# Patient Record
Sex: Male | Born: 1955
Health system: Southern US, Community
[De-identification: ages and names within clinical notes are randomized; demographics above are authoritative.]

## PROBLEM LIST (undated history)

## (undated) DIAGNOSIS — I10 Essential (primary) hypertension: Secondary | ICD-10-CM

## (undated) DIAGNOSIS — M65842 Other synovitis and tenosynovitis, left hand: Secondary | ICD-10-CM

## (undated) DIAGNOSIS — G4733 Obstructive sleep apnea (adult) (pediatric): Secondary | ICD-10-CM

## (undated) DIAGNOSIS — M1990 Unspecified osteoarthritis, unspecified site: Secondary | ICD-10-CM

## (undated) DIAGNOSIS — M109 Gout, unspecified: Secondary | ICD-10-CM

## (undated) DIAGNOSIS — E785 Hyperlipidemia, unspecified: Secondary | ICD-10-CM

## (undated) HISTORY — DX: Essential (primary) hypertension: I10

## (undated) HISTORY — DX: Hyperlipidemia, unspecified: E78.5

## (undated) HISTORY — PX: UMBILICAL HERNIA REPAIR: SHX196

## (undated) HISTORY — DX: Obstructive sleep apnea (adult) (pediatric): G47.33

## (undated) HISTORY — PX: HERNIA REPAIR: SHX51

---

## 1999-09-09 ENCOUNTER — Ambulatory Visit: Admission: RE | Admit: 1999-09-09 | Discharge: 1999-09-09 | Payer: Self-pay | Admitting: Pulmonary Disease

## 2002-06-28 ENCOUNTER — Ambulatory Visit (HOSPITAL_COMMUNITY): Admission: RE | Admit: 2002-06-28 | Discharge: 2002-06-28 | Payer: Self-pay | Admitting: General Surgery

## 2005-02-06 ENCOUNTER — Other Ambulatory Visit: Admission: RE | Admit: 2005-02-06 | Discharge: 2005-02-06 | Payer: Self-pay | Admitting: Dermatology

## 2005-05-29 ENCOUNTER — Ambulatory Visit: Payer: Self-pay | Admitting: Internal Medicine

## 2005-05-29 ENCOUNTER — Ambulatory Visit (HOSPITAL_COMMUNITY): Admission: RE | Admit: 2005-05-29 | Discharge: 2005-05-29 | Payer: Self-pay | Admitting: Internal Medicine

## 2005-11-18 ENCOUNTER — Ambulatory Visit: Payer: Self-pay | Admitting: Orthopedic Surgery

## 2009-06-20 ENCOUNTER — Telehealth (INDEPENDENT_AMBULATORY_CARE_PROVIDER_SITE_OTHER): Payer: Self-pay | Admitting: *Deleted

## 2010-10-22 ENCOUNTER — Ambulatory Visit (INDEPENDENT_AMBULATORY_CARE_PROVIDER_SITE_OTHER): Payer: BC Managed Care – PPO | Admitting: Pulmonary Disease

## 2010-10-22 ENCOUNTER — Encounter: Payer: Self-pay | Admitting: Pulmonary Disease

## 2010-10-22 VITALS — BP 122/84 | HR 75 | Temp 97.6°F | Ht 70.0 in | Wt 262.0 lb

## 2010-10-22 DIAGNOSIS — G4733 Obstructive sleep apnea (adult) (pediatric): Secondary | ICD-10-CM

## 2010-10-22 NOTE — Progress Notes (Signed)
  Subjective:    Patient ID: Richard Nolan, male    DOB: [birth year], 55 y.o.   MRN: [id number]  HPI The pt is a 54y/o male who comes in today to re-establish for management of osa.  He was first diagnosed in 1999 with AHI 37/hr, and ultimately found to have optimal cpap of 15cm.  He has been compliant with cpap since starting, but is using the same machine from 13 yrs ago.  He is way overdue for a new mask and supplies.  He is compliant with the device, and feels he is sleeping well.  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  He denies any issues with sleepiness during his time to be awake.  His weight is up about 12 pounds from the last visit, and his epworth score is only 9 today.    Sleep Questionnaire: What time do you typically go to bed?( Between what hours) 8 pm to 9 pm How long does it take you to fall asleep? few minutes How many times during the night do you wake up? 0 What time do you get out of bed to start your day? 0145 Do you drive or operate heavy machinery in your occupation? Yes How much has your weight changed (up or down) over the past two years? (In pounds) 0 oz (0 kg) Have you ever had a sleep study before? Yes If yes, location of study? [place] x 2 If yes, date of study? approx 10 to 12 years ago Do you currently use CPAP? Yes If so, what pressure? 15 Do you wear oxygen at any time? No    Review of Systems  Constitutional: Negative for fever and unexpected weight change.  HENT: Negative for ear pain, nosebleeds, congestion, sore throat, rhinorrhea, sneezing, trouble swallowing, dental problem, postnasal drip and sinus pressure.   Eyes: Negative for redness and itching.  Respiratory: Negative for cough, chest tightness, shortness of breath and wheezing.   Cardiovascular: Negative for palpitations and leg swelling.  Gastrointestinal: Negative for nausea and vomiting.  Genitourinary: Negative for dysuria.  Musculoskeletal: Negative for joint  swelling.  Skin: Negative for rash.  Neurological: Negative for headaches.  Hematological: Does not bruise/bleed easily.  Psychiatric/Behavioral: Negative for dysphoric mood. The patient is not nervous/anxious.        Objective:   Physical Exam Constitutional:  Well developed, but overweight, no acute distress  HENT:  Nares with deviated septum to right with near obstruction, left patent  Oropharynx without exudate, palate and uvula elongated  Eyes:  Perrla, eomi, no scleral icterus  Neck:  No JVD, no TMG  Cardiovascular:  Normal rate, regular rhythm, no rubs or gallops.  No murmurs        Intact distal pulses  Pulmonary :  Normal breath sounds, no stridor or respiratory distress   No rales, rhonchi, or wheezing  Abdominal:  Soft, nondistended, bowel sounds present.  No tenderness noted.   Musculoskeletal:  No lower extremity edema noted.  Lymph Nodes:  No cervical lymphadenopathy noted  Skin:  No cyanosis noted  Neurologic:  Alert, appropriate, moves all 4 extremities without obvious deficit.         Assessment & Plan:

## 2010-10-22 NOTE — Assessment & Plan Note (Signed)
The pt has a h/o moderate to severe osa from 1999, and has been very compliant with cpap since that time with excellent response.  He is way overdue for new supplies and a new cpap machine, and will arrange this thru DME.  He feels he is sleeping well, and denies EDS.  I have encouraged him to work on weight loss, and to also see me yearly.

## 2010-10-22 NOTE — Patient Instructions (Signed)
Will get you new cpap mask and supplies Consider getting new cpap machine.  Yours is very old. Work on weight loss followup with me in one year

## 2010-10-23 ENCOUNTER — Encounter: Payer: Self-pay | Admitting: Pulmonary Disease

## 2011-03-18 ENCOUNTER — Telehealth: Payer: Self-pay | Admitting: Pulmonary Disease

## 2011-03-18 DIAGNOSIS — G4733 Obstructive sleep apnea (adult) (pediatric): Secondary | ICD-10-CM

## 2011-03-18 NOTE — Telephone Encounter (Signed)
Pt returning call can be reached at [phone].Richard Nolan

## 2011-03-18 NOTE — Telephone Encounter (Signed)
Pt says he did a 2 week download back in May 2012 and has never heard from the results. He is still in need of a new mask and woul dlike an order for this to be sent to [facility] in [place]. Pls advise.

## 2011-03-18 NOTE — Telephone Encounter (Signed)
lmomtcb  

## 2011-03-18 NOTE — Telephone Encounter (Signed)
Let pt know that we have not received his download, but will be happy to try and get this for him. Ok to send prescription for him to get new mask .

## 2011-03-18 NOTE — Telephone Encounter (Signed)
Pt is aware we will send an order for a new CPAP mask to [facility] in [place] and we will send msg to Dr. Teddy Spike nurse so she can follow-up on download from May 2012.

## 2011-03-20 NOTE — Telephone Encounter (Signed)
Called and spoke with Amy at [facility] and requested download on cpap machine from May 2012.  Waiting on fax.

## 2011-03-20 NOTE — Telephone Encounter (Signed)
Received results from [facility] and put in [facility] VIP folder.

## 2011-03-20 NOTE — Telephone Encounter (Signed)
Let pt know that it appears his optimal pressure on download is 10cm.  Can send order to dme for this.

## 2011-03-20 NOTE — Telephone Encounter (Signed)
LMTCB

## 2011-03-21 NOTE — Telephone Encounter (Signed)
lmomtcb  

## 2011-03-21 NOTE — Telephone Encounter (Signed)
Pt aware of results and referral has been sent

## 2011-07-15 ENCOUNTER — Telehealth: Payer: Self-pay | Admitting: Pulmonary Disease

## 2011-07-15 DIAGNOSIS — G4733 Obstructive sleep apnea (adult) (pediatric): Secondary | ICD-10-CM

## 2011-07-15 NOTE — Telephone Encounter (Signed)
I would be happy to do this, but with DOT involved, will need download off his machine for the last 6mos to document his compliance before I can write his note. Please send an order to dme to get this asap, and let pt know so that he can take machine to dme to have this done asap  (needs 6mos download).  Will write his note once I get the download.

## 2011-07-15 NOTE — Telephone Encounter (Addendum)
lmomtcb x1 on # left to call ATC house # but NA [facility]

## 2011-07-15 NOTE — Telephone Encounter (Signed)
Pt returned triage's call.  Pt stated this needs to be taken care of ASAP or he will be fired.  Pt asked to be reached at [phone].  Richard Nolan

## 2011-07-15 NOTE — Telephone Encounter (Signed)
I spoke with Richard Nolan and he needs KC to write him a letter for DOT stating he is complaint w/ CPAP and it's "doing it's job". Richard Nolan states his certificate expires at the end of this month and needs this done at least by the end of the week. Please advise Dr. Shelle Iron

## 2011-07-16 ENCOUNTER — Telehealth: Payer: Self-pay | Admitting: Pulmonary Disease

## 2011-07-16 NOTE — Telephone Encounter (Signed)
LMTCB

## 2011-07-16 NOTE — Telephone Encounter (Signed)
[facility] pharmacy returned Richard Nolan's call.  Richard Nolan

## 2011-07-16 NOTE — Telephone Encounter (Signed)
So you are telling me that his current machine is totally non-downloadable??? Please verify this with dme and let me know.

## 2011-07-16 NOTE — Telephone Encounter (Signed)
Spoke with pt and notified of recs per [facility]. Pt verbalized understanding and urgent order was sent to [facility] for CPAP download to be obtained.

## 2011-07-16 NOTE — Telephone Encounter (Signed)
Pt returned triage's call.   °

## 2011-07-16 NOTE — Telephone Encounter (Signed)
LMTCB for Ms. Richard Nolan

## 2011-07-16 NOTE — Telephone Encounter (Signed)
lmtcb

## 2011-07-16 NOTE — Telephone Encounter (Signed)
Pt stated pharmacy is [name] in [place] [phone]  & that we need to ask to speak w/ [name].  Pt stated that he is not in DOT compliance w/ CPAP & needs it fixed now.  Richard Nolan

## 2011-07-16 NOTE — Telephone Encounter (Signed)
Pt's cpap is 58yrs old and can not give compliance dot needs so pt wants kc to give and order to [facility] to get a new cpap that will do this so he can be dot compliant

## 2011-07-16 NOTE — Telephone Encounter (Signed)
Called [facility] spoke with [facility], one of the resp therapists.  She stated that they received an order for pt to have a 6mon download on his CPAP.  Velna Hatchet stated that they will contact pt to bring his CPAP in to retrieve this info and send it to our office.  Will forward to [facility] as pt has returned Libby's call.

## 2011-07-17 ENCOUNTER — Other Ambulatory Visit: Payer: Self-pay | Admitting: Pulmonary Disease

## 2011-07-17 DIAGNOSIS — G4733 Obstructive sleep apnea (adult) (pediatric): Secondary | ICD-10-CM

## 2011-07-17 NOTE — Telephone Encounter (Signed)
Pt is returning call.  Pt now states that DOT will issue him a new card so long as KC stated that his condition is controlled.  MUST state something along those lines.  Pt's wife can be reached at [phone] or pt can be reached at [phone].  Antionette Fairy

## 2011-07-17 NOTE — Telephone Encounter (Signed)
Pt called back again. Says he needs this rx for  DOT compliance. Call him asap today at [phone]. Hazel Sams

## 2011-07-17 NOTE — Telephone Encounter (Signed)
Yes he needs a new machine please give an order today pt keeps calling

## 2011-07-17 NOTE — Telephone Encounter (Signed)
I spoke with pt and he states he needs a new cpap machine bc his non downloadable for his DOT compliance report. Pt also states he needs Dr. Shelle Iron to write him a letter stating his OSA is controlled by using the CPAP. Pt states if this is not done by feb 1 then he will be unemployed. Pt is needing this sent to the doctor in [place] but could not tell me his name/fax #. Pt states when we call him when the letter is done he will have this information. Please advise Dr. Shelle Iron, thanks

## 2011-07-17 NOTE — Telephone Encounter (Signed)
Let him know that I cannot write a letter saying his sleep apnea is controlled without a download showing he is totally compliant.  I can write a letter saying he is seeing me for sleep apnea, and that he denies sleepiness during the day.  I have no way of documenting whether his sleep apnea is controlled or not without a download.  The other option is to get a 6 week extension, get him a new machine, then get 4 week download to document his "control".  Then I could write his letter as such.  Will send order to dme for new machine regardless of what he decides.

## 2011-07-18 NOTE — Telephone Encounter (Signed)
Called and spoke with pt. Pt states he appreciates the order sent to DME company for new machine.  He will go by the DME office either today or tomorrow to pick up machine.  Pt is ok with KC's suggestion about a 6 week extension.  Pt requests KC write a letter stating he is following pt for his OSA and needing a 6 week extension to allow [place] to get a download off of pt's cpap machine to verify compliance with his cpap.  Pt states he can then turn the letter into his DOT doctor in [place].  KC, please advise.  Thanks!

## 2011-07-18 NOTE — Telephone Encounter (Signed)
done

## 2011-07-18 NOTE — Telephone Encounter (Signed)
LMOM informing pt I have faxed letter to DR. Guarino and that I was also going to mail pt a copy of the letter for his records.  Informed pt to call us back if there was anything further he needed.

## 2011-07-18 NOTE — Telephone Encounter (Signed)
Pt returned triage's call & said the fax number is [phone] to the Attn: of Dr. Laverle Hobby.  Richard Nolan

## 2011-07-18 NOTE — Telephone Encounter (Signed)
LMOM for pt TCB to see if he wants letter mailed, faxed or if he wants to pick it up.

## 2011-09-17 ENCOUNTER — Telehealth: Payer: Self-pay | Admitting: Pulmonary Disease

## 2011-09-17 NOTE — Telephone Encounter (Signed)
Sounds more like a download report from [facility] in [place]. Spoke with Aundra Millet regarding this and we will check on this tomorrow and advise.

## 2011-09-17 NOTE — Telephone Encounter (Signed)
Received download from [facility] pharmacy and put in [facility] VIP folder for him to review

## 2011-09-18 NOTE — Telephone Encounter (Signed)
ATC pt and was hung up on and tried to call back no answer. WCb later  I have made copy and placed in [facility] scan folder. Letter is hanging in triage

## 2011-09-18 NOTE — Telephone Encounter (Signed)
Pt returned call w/ the fax number:  [phone].  Richard Nolan

## 2011-09-18 NOTE — Telephone Encounter (Signed)
Let pt know that I have reviewed his download.  Shows good compliance and control of his sleep apnea.  The only issue here is that it is only a 4 week download.  DOT sometimes wants 6mos!! I had asked them for a 6mos download from his old machine.  Will write a letter for the 4 week download, and hopefully this will be enough for DOT>

## 2011-09-18 NOTE — Telephone Encounter (Signed)
I called pt and confirmed fax # pt is wanting the letter sent to. He states to Dr. Carnella Guadalajara at [place] [facility]. I have faxed this over and nothing further was needed

## 2011-09-18 NOTE — Telephone Encounter (Signed)
I spoke with pt and he is aware of KC response. Pt is going to call back to give [place] a fax # to fax the letter to. The letter is hanging in triage. Will await call back

## 2011-09-18 NOTE — Telephone Encounter (Signed)
Pt returned call. Richard Nolan  

## 2011-10-03 ENCOUNTER — Encounter: Payer: Self-pay | Admitting: Pulmonary Disease

## 2011-10-28 ENCOUNTER — Ambulatory Visit (INDEPENDENT_AMBULATORY_CARE_PROVIDER_SITE_OTHER): Payer: BC Managed Care – PPO | Admitting: Pulmonary Disease

## 2011-10-28 ENCOUNTER — Encounter: Payer: Self-pay | Admitting: Pulmonary Disease

## 2011-10-28 VITALS — BP 118/74 | HR 68 | Temp 98.7°F | Ht 70.0 in | Wt 254.4 lb

## 2011-10-28 DIAGNOSIS — G4733 Obstructive sleep apnea (adult) (pediatric): Secondary | ICD-10-CM

## 2011-10-28 NOTE — Assessment & Plan Note (Signed)
The patient is wearing CPAP compliance, and feels that he is doing well with the device.  I have asked him to keep up with mask changes and supplies, and to work aggressively on weight loss.  He is doing well, I will see him back in one year.

## 2011-10-28 NOTE — Patient Instructions (Signed)
Stay on cpap, keep up with supplies and mask changes. Work on weight loss followup with me in one year.

## 2011-10-28 NOTE — Progress Notes (Signed)
  Subjective:    Patient ID: Richard Nolan, male    DOB: 08/31/1955, 56 y.o.   MRN: [id number]  HPI The patient comes in today for followup of his known obstructive sleep apnea.  He is wearing CPAP compliance, and is having no issues with his mask fit or pressure.  He feels that he sleeps well, and denies any issues with daytime sleepiness.  He has been keeping up with his mask changes and supplies.   Review of Systems  Constitutional: Negative for fever and unexpected weight change.  HENT: Negative for ear pain, nosebleeds, congestion, sore throat, rhinorrhea, sneezing, trouble swallowing, dental problem, postnasal drip and sinus pressure.   Eyes: Negative for redness and itching.  Respiratory: Negative for cough, chest tightness, shortness of breath and wheezing.   Cardiovascular: Negative for palpitations and leg swelling.  Gastrointestinal: Negative for nausea and vomiting.  Genitourinary: Negative for dysuria.  Musculoskeletal: Negative for joint swelling.  Skin: Negative for rash.  Neurological: Negative for headaches.  Hematological: Does not bruise/bleed easily.  Psychiatric/Behavioral: Negative for dysphoric mood. The patient is not nervous/anxious.        Objective:   Physical Exam Overweight male in no acute distress Nose without purulence or discharge noted No skin breakdown or pressure necrosis from the CPAP mask Lower extremities without edema, no cyanosis Alert and oriented, moves all 4 extremities.       Assessment & Plan:

## 2012-06-29 ENCOUNTER — Telehealth: Payer: Self-pay | Admitting: Pulmonary Disease

## 2012-06-29 NOTE — Telephone Encounter (Signed)
Letter for CPAP compliance was faxed to the number that pt requested Spoke with pt and notified that this was done

## 2012-10-27 ENCOUNTER — Encounter: Payer: Self-pay | Admitting: Pulmonary Disease

## 2012-10-27 ENCOUNTER — Ambulatory Visit (INDEPENDENT_AMBULATORY_CARE_PROVIDER_SITE_OTHER): Payer: BC Managed Care – PPO | Admitting: Pulmonary Disease

## 2012-10-27 VITALS — BP 136/78 | HR 73 | Temp 97.1°F | Ht 70.0 in | Wt 260.2 lb

## 2012-10-27 DIAGNOSIS — G4733 Obstructive sleep apnea (adult) (pediatric): Secondary | ICD-10-CM

## 2012-10-27 NOTE — Assessment & Plan Note (Signed)
The patient is doing very well on CPAP, and his download shows excellent compliance and control of his sleep disordered breathing.  I've asked the patient to continue with his CPAP, and to keep up with his supplies.  I have also encouraged him to work aggressively on weight loss.

## 2012-10-27 NOTE — Patient Instructions (Addendum)
Your download looks great.  Keep wearing cpap Keep up with mask cushion changes and supplies Work on weight loss followup with me in one year if doing well.

## 2012-10-27 NOTE — Progress Notes (Signed)
  Subjective:    Patient ID: Richard Nolan, male    DOB: [birth year], 57 y.o.   MRN: [id number]  HPI Patient comes in today for followup of his obstructive sleep apnea.  He is wearing CPAP compliantly but has downloaded, and has excellent control of his AHI.  The patient feels that he is sleeping well, and is having no significant daytime sleepiness.  He is having no issues with his mask fit or pressure.  Of note, his weight is stable since his last visit.   Review of Systems  Constitutional: Negative for fever and unexpected weight change.  HENT: Negative for ear pain, nosebleeds, congestion, sore throat, rhinorrhea, sneezing, trouble swallowing, dental problem, postnasal drip and sinus pressure.        Nasal congestion with CPAP use  Eyes: Negative for redness and itching.  Respiratory: Negative for cough, chest tightness, shortness of breath and wheezing.   Cardiovascular: Negative for palpitations and leg swelling.  Gastrointestinal: Negative for nausea and vomiting.  Genitourinary: Negative for dysuria.  Musculoskeletal: Negative for joint swelling.  Skin: Negative for rash.  Neurological: Negative for headaches.  Hematological: Does not bruise/bleed easily.  Psychiatric/Behavioral: Negative for dysphoric mood. The patient is not nervous/anxious.        Objective:   Physical Exam Obese male in no acute distress  nose with purulent discharge noted No skin breakdown or pressure necrosis from a CPAP mask Neck without lymphadenopathy or thyromegaly Lower extremities without edema, no cyanosis Alert and oriented, moves all 4 extremities.       Assessment & Plan:

## 2012-12-10 ENCOUNTER — Ambulatory Visit (INDEPENDENT_AMBULATORY_CARE_PROVIDER_SITE_OTHER): Payer: BC Managed Care – PPO

## 2012-12-10 ENCOUNTER — Ambulatory Visit (INDEPENDENT_AMBULATORY_CARE_PROVIDER_SITE_OTHER): Payer: BC Managed Care – PPO | Admitting: Orthopedic Surgery

## 2012-12-10 VITALS — BP 122/78 | Ht 69.0 in | Wt 254.0 lb

## 2012-12-10 DIAGNOSIS — M79609 Pain in unspecified limb: Secondary | ICD-10-CM

## 2012-12-10 DIAGNOSIS — M79672 Pain in left foot: Secondary | ICD-10-CM

## 2012-12-10 DIAGNOSIS — M6789 Other specified disorders of synovium and tendon, multiple sites: Secondary | ICD-10-CM

## 2012-12-10 NOTE — Patient Instructions (Addendum)
Over the counter aspercreme twice a day   Inserts in shoe   Return 1 month

## 2012-12-14 ENCOUNTER — Encounter: Payer: Self-pay | Admitting: Orthopedic Surgery

## 2012-12-14 DIAGNOSIS — M6789 Other specified disorders of synovium and tendon, multiple sites: Secondary | ICD-10-CM | POA: Insufficient documentation

## 2012-12-14 DIAGNOSIS — M6790 Unspecified disorder of synovium and tendon, unspecified site: Secondary | ICD-10-CM | POA: Insufficient documentation

## 2012-12-14 NOTE — Progress Notes (Signed)
Patient ID: Richard Nolan., male   DOB: [birth year], 57 y.o.   MRN: [id number] Chief Complaint  Patient presents with  . Foot Pain    Left foot pain. Referred by Dr. Ouida Sills    History pain left foot on the side of the heel which started gradually and undergo with no trauma. Patient is 7/10 throbbing intermittent pain partially relieved by ice and medication worse with movement associated with swelling  Review of systems snoring cough heartburn joint pain  The past, family history and social history have been reviewed and are recorded in the corresponding sections of epic   BP 122/78  Ht 5\' 9"  (1.753 m)  Wt 254 lb (115.214 kg)  BMI 37.49 kg/m2 General appearance is normal, the patient is alert and oriented x3 with normal mood and affect.  Habitus mesomorphic. Ambulation and station are normal. There is tenderness on the dorsum and posterior part of the foot. There is questionable arthritis in the midfoot. There is questionable posterior tibial tendon dysfunction based on the swelling and tenderness over the posterior tibial tendon which is tracking up the posterior medial malleolus. He has a normal tip toe test with single leg heel rise. Ankle stability confirmed. No atrophy. Skin intact. Pulse and temperature normal. Normal sensation.  X-ray show no acute abnormality  Impression posterior tibial tendon dysfunction  Plan over-the-counter inserts at this point. The patient does not want to stay out of work for 6 weeks in a [employer]. We'll reevaluate him to see if this has made any improvement. Also use Aspercreme as a topical anti-inflammatory.

## 2013-01-07 ENCOUNTER — Ambulatory Visit (INDEPENDENT_AMBULATORY_CARE_PROVIDER_SITE_OTHER): Payer: BC Managed Care – PPO | Admitting: Orthopedic Surgery

## 2013-01-07 VITALS — Ht 69.0 in | Wt 254.0 lb

## 2013-01-07 DIAGNOSIS — M6789 Other specified disorders of synovium and tendon, multiple sites: Secondary | ICD-10-CM

## 2013-01-07 NOTE — Progress Notes (Signed)
Patient ID: Richard Mcdowell., male   DOB: May 30, 1956, 57 y.o.   MRN: [id number] Chief Complaint  Patient presents with  . Follow-up    1 month recheck on PTTD.    Posterior tibial tendon dysfunction type I treated with orthotics doing well rested as advised  No numbness or tingling no weakness  There were no vitals taken for this visit. General appearance is normal, the patient is alert and oriented x3 with normal mood and affect. Ambulation is normal without assistive device or limp no tenderness or swelling normal range of motion in the foot Height 59 weight 254 pounds   Posterior tibial tendon dysfunction type I improved continue orthotic followup as needed

## 2013-01-07 NOTE — Patient Instructions (Addendum)
Wear orthotics

## 2013-06-09 ENCOUNTER — Telehealth: Payer: Self-pay | Admitting: Pulmonary Disease

## 2013-06-09 NOTE — Telephone Encounter (Signed)
Called, spoke with pt: 1.  Pt states he is having some nasal congestion and r/t being "that time of year."  Pt states cpap is set at 8 and feels this setting is "weak" right now with the congestion.  He would like this to be increased.  If KC is ok with this, order will need to be sent to [facility]. 2.  Also, pt reports [facility] printed a yearly usage report from cpap and advised they would fax it to [facility] for review.  Once received, pt is requesting [facility] write letter for compliance report for DOT.    Dr. Shelle Iron, pls advise.  Thank you.

## 2013-06-09 NOTE — Telephone Encounter (Signed)
lmtcb x1 for Richard Nolan

## 2013-06-09 NOTE — Telephone Encounter (Signed)
Let pt know that he can use afrin 2 sprays each nostril at bedtime before putting on cpap for 7 nights ONLY.  Do not use during the day, and do not use when congestion improves.  Then see if he feels the pressure is too low.  Will be happy to document compliance in a letter once download received.

## 2013-06-09 NOTE — Telephone Encounter (Signed)
Pt returning call. Please CB at [phone]

## 2013-06-09 NOTE — Telephone Encounter (Signed)
Spoke with Denyse Amass with [employer].  Reports pt was in their office and c/o having trouble and was requesting pressure to be changed. Denyse Amass doesn't have an order for this and didn't know what kind of trouble pt was having.  Advised I would have to call pt to clarify and would then send msg to [facility] to advise.  Denyse Amass verbalized understanding.  Called, spoke with pt's wife.  She will ask him to call office back.

## 2013-06-10 NOTE — Telephone Encounter (Signed)
lmtcb for pt x1 

## 2013-06-11 NOTE — Telephone Encounter (Signed)
Pt advised. Jennifer Castillo, CMA  

## 2013-06-21 ENCOUNTER — Telehealth: Payer: Self-pay | Admitting: Pulmonary Disease

## 2013-06-21 NOTE — Telephone Encounter (Signed)
lmomtcb x1 

## 2013-06-21 NOTE — Telephone Encounter (Signed)
Returning call can be reached at [phone].Richard Nolan

## 2013-06-21 NOTE — Telephone Encounter (Signed)
I spoke with pt and he reports Dr. Pernell Dupre office was going to fax a download off his machine on Friday. He is wanting to know if this was received. Please advise Ashtyn if this has been received? thanks

## 2013-06-22 NOTE — Telephone Encounter (Signed)
D/L received and placed in KC green folder to review.  Please advise, thanks.

## 2013-06-22 NOTE — Telephone Encounter (Signed)
Letter done and is in triage.

## 2013-06-23 NOTE — Telephone Encounter (Signed)
I have faxed download and letter to DOT office at the number provided  [facility] informing the pt that this was done

## 2013-09-28 ENCOUNTER — Ambulatory Visit: Payer: BC Managed Care – PPO | Admitting: Orthopedic Surgery

## 2013-10-17 ENCOUNTER — Ambulatory Visit (INDEPENDENT_AMBULATORY_CARE_PROVIDER_SITE_OTHER): Payer: BC Managed Care – PPO | Admitting: Internal Medicine

## 2013-10-17 VITALS — BP 120/80 | HR 79 | Temp 97.9°F | Resp 16 | Ht 69.0 in | Wt 254.0 lb

## 2013-10-17 DIAGNOSIS — J019 Acute sinusitis, unspecified: Secondary | ICD-10-CM

## 2013-10-17 DIAGNOSIS — G4733 Obstructive sleep apnea (adult) (pediatric): Secondary | ICD-10-CM

## 2013-10-17 DIAGNOSIS — J310 Chronic rhinitis: Secondary | ICD-10-CM

## 2013-10-17 DIAGNOSIS — T485X5A Adverse effect of other anti-common-cold drugs, initial encounter: Principal | ICD-10-CM

## 2013-10-17 MED ORDER — FLUTICASONE PROPIONATE 50 MCG/ACT NA SUSP: NASAL | Status: DC

## 2013-10-17 MED ORDER — PREDNISONE 20 MG PO TABS: ORAL_TABLET | ORAL | Status: DC

## 2013-10-17 MED ORDER — AMOXICILLIN 875 MG PO TABS: 875.0000 mg | ORAL_TABLET | Freq: Two times a day (BID) | ORAL | Status: DC

## 2013-10-17 NOTE — Progress Notes (Signed)
Subjective:    Patient ID: Richard PimpleIra T Cumby Jr., male    DOB: 04/29/1956, 58 y.o.   MRN: [id number]  HPI Chief Complaint  Patient presents with   Nasal Congestion    Headache x 1 week    This chart was scribed for Ellamae Siaobert Doolittle, MD by Andrew Auaven Small, ED Scribe. This patient was seen in room 11 and the patient's care was started at 5:25 PM.  HPI Comments: Richard Pimplera T Dickison Jr. is a 58 y.o. male who presents to the [facility] complaining of constant HA onset 1 week. Pt reports associated nasal congestion. Pt reports that when he bends forward his HA and congestion worsens. Pt reports that his congestion does not interfere with sleep because he uses an aspirin-like medication. He has been using this medication more frequently throughout the day for the past week because of his congestion.Pt reports that he sleeps with sleep apnea machine (Dr. Shelle Ironlance). Pt reports that at night he uses an ointment similar to Vicks before bed as a decongestant. Pt denies allergies, eye irritation, cough, sore throat. Pt denies other health issues.   Past Medical History  Diagnosis Date   OSA (obstructive sleep apnea)    Allergies  Allergen Reactions   Vioxx [Rofecoxib]     Throat swelling   Prior to Admission medications   Medication Sig Start Date End Date Taking? Authorizing Provider  aspirin 81 MG tablet Take 81 mg by mouth daily.     Yes Historical Provider, MD  Omega-3 Fatty Acids (FISH OIL) 1000 MG CAPS Take 1 capsule by mouth daily.     Yes Historical Provider, MD    Review of Systems  Constitutional: Negative for fever and chills.  HENT: Positive for congestion. Negative for sneezing and sore throat.   Eyes: Negative for itching.  Respiratory: Negative for choking.   Neurological: Positive for headaches.      Objective:   Physical Exam  Nursing note and vitals reviewed. Constitutional: He is oriented to person, place, and time. He appears well-developed and well-nourished.  No distress.  HENT:  Head: Normocephalic and atraumatic.  Nose: Right sinus exhibits maxillary sinus tenderness. Left sinus exhibits maxillary sinus tenderness.  Swollen turbinates bilaterally, tender maxillary sinuses bilaterally   Eyes: EOM are normal.  Neck: Neck supple. No tracheal deviation present.  Cardiovascular: Normal rate.   Pulmonary/Chest: Effort normal. No respiratory distress.  Musculoskeletal: Normal range of motion.  Neurological: He is alert and oriented to person, place, and time.  Skin: Skin is warm and dry.  Psychiatric: He has a normal mood and affect. His behavior is normal.      Assessment & Plan:  I have completed the patient encounter in its entirety as documented by the scribe, with editing by me where necessary. Robert P. Merla Richesoolittle, M.D. Rhinitis medicamentosa  Acute sinusitis, unspecified  OSA (obstructive sleep apnea)  Meds ordered this encounter  Medications   predniSONE (DELTASONE) 20 MG tablet    Sig: 3/3/2/2/1/1 single daily dose for 6 days    Dispense:  12 tablet    Refill:  0   amoxicillin (AMOXIL) 875 MG tablet    Sig: Take 1 tablet (875 mg total) by mouth 2 (two) times daily.    Dispense:  20 tablet    Refill:  0   fluticasone (FLONASE) 50 MCG/ACT nasal spray    Sig: 1 spray each nostril twice a day    Dispense:  16 g    Refill:  6  ° ° ° °

## 2013-10-27 ENCOUNTER — Ambulatory Visit (INDEPENDENT_AMBULATORY_CARE_PROVIDER_SITE_OTHER): Payer: BC Managed Care – PPO | Admitting: Pulmonary Disease

## 2013-10-27 ENCOUNTER — Encounter: Payer: Self-pay | Admitting: Pulmonary Disease

## 2013-10-27 VITALS — BP 140/86 | HR 65 | Temp 98.2°F | Ht 69.0 in | Wt 263.6 lb

## 2013-10-27 DIAGNOSIS — G4733 Obstructive sleep apnea (adult) (pediatric): Secondary | ICD-10-CM

## 2013-10-27 NOTE — Progress Notes (Signed)
   Subjective:    Patient ID: Richard PimpleIra T Zobrist Jr., male    DOB: 03/21/1956, 58 y.o.   MRN: [id number]  HPI The patient comes in today for followup of his obstructive sleep apnea. He is wearing CPAP compliantly, but does feel like he needs a little more pressure at times. He is also struggling with his nasal mask because of chronic nasal congestion, and wishes to try a full face mask. Overall, he is sleeping fairly well with the device, and is satisfied with his daytime alertness.   Review of Systems  Constitutional: Negative for fever and unexpected weight change.  HENT: Positive for congestion. Negative for dental problem, ear pain, nosebleeds, postnasal drip, rhinorrhea, sinus pressure, sneezing, sore throat and trouble swallowing.   Eyes: Negative for redness and itching.  Respiratory: Negative for cough, chest tightness, shortness of breath and wheezing.   Cardiovascular: Negative for palpitations and leg swelling.  Gastrointestinal: Negative for nausea and vomiting.  Genitourinary: Negative for dysuria.  Musculoskeletal: Negative for joint swelling.  Skin: Negative for rash.  Neurological: Negative for headaches.  Hematological: Does not bruise/bleed easily.  Psychiatric/Behavioral: Negative for dysphoric mood. The patient is not nervous/anxious.        Objective:   Physical Exam Overweight male in no acute distress Nose without purulence or discharge noted No skin breakdown or pressure necrosis from the CPAP mask Neck without lymphadenopathy or thyromegaly Lower extremities without significant edema, no cyanosis Alert and oriented, moves all 4 extremities.       Assessment & Plan:

## 2013-10-27 NOTE — Assessment & Plan Note (Signed)
The patient is doing very well with CPAP overall, but feels that he needs a little more pressure. He would also like to try a full face mask at his upcoming mask change. I will go ahead and put his CPAP device on the automatic setting, and will send a prescription to his home care company for a full face mask. I've also asked him to work aggressively on weight loss.

## 2013-10-27 NOTE — Patient Instructions (Signed)
Lloyd Hugereil med sinus rinses am and pm for next 1-2 weeks to help clean out sinuses. [facility] and to set your cpap on the auto setting. Work on weight loss followup with me in one year if doing well, but call if having issues.

## 2014-01-05 ENCOUNTER — Telehealth: Payer: Self-pay | Admitting: Pulmonary Disease

## 2014-01-05 NOTE — Telephone Encounter (Signed)
Please let pt know that his download looks great on the auto setting.  If he is happy with this, will leave on auto .

## 2014-01-07 NOTE — Telephone Encounter (Signed)
[email or web address].Jennifer Castillo, CMA  

## 2014-01-11 NOTE — Telephone Encounter (Signed)
Pt returned call & will try back again later today-after 3:00 PM.  Richard FairyHolly D Pryor

## 2014-01-11 NOTE — Telephone Encounter (Signed)
[email or web address]. Jennifer Castillo, CMA  

## 2014-01-12 NOTE — Telephone Encounter (Signed)
Called and spoke to pt. Informed pt of KC's recs and results. Pt verbalized understanding and stated he will stay at the auto setting. Pt denied any further questions or concerns at this time.

## 2014-01-12 NOTE — Telephone Encounter (Signed)
Pt returned call

## 2014-04-14 ENCOUNTER — Other Ambulatory Visit: Payer: Self-pay | Admitting: Family Medicine

## 2014-04-14 DIAGNOSIS — J310 Chronic rhinitis: Secondary | ICD-10-CM

## 2014-04-14 DIAGNOSIS — T485X5A Adverse effect of other anti-common-cold drugs, initial encounter: Principal | ICD-10-CM

## 2014-04-14 MED ORDER — FLUTICASONE PROPIONATE 50 MCG/ACT NA SUSP: NASAL | Status: DC

## 2014-04-20 ENCOUNTER — Other Ambulatory Visit: Payer: Self-pay

## 2014-04-20 DIAGNOSIS — J310 Chronic rhinitis: Secondary | ICD-10-CM

## 2014-04-20 DIAGNOSIS — T485X5A Adverse effect of other anti-common-cold drugs, initial encounter: Principal | ICD-10-CM

## 2014-04-20 MED ORDER — FLUTICASONE PROPIONATE 50 MCG/ACT NA SUSP: NASAL | Status: DC

## 2014-05-20 ENCOUNTER — Telehealth: Payer: Self-pay | Admitting: Pulmonary Disease

## 2014-05-20 NOTE — Telephone Encounter (Signed)
lmomtcb x1 for pt. Download was received. On download it states fax DOT info to [phone]. Is he needing a compliance letter?

## 2014-05-23 NOTE — Telephone Encounter (Signed)
lmomtcb x 2  

## 2014-05-24 NOTE — Telephone Encounter (Signed)
Spoke with pt - Pt would like DOT compliance letter faxed to # below.  States this will go to the [facility]. Pt aware we will call him back once complete.  He verbalized understanding and voiced no further questions or concerns at this time.

## 2014-05-24 NOTE — Telephone Encounter (Signed)
lmtcb for pt.  

## 2014-05-24 NOTE — Telephone Encounter (Signed)
Pt returning call and can be reached @ [phone].Richard GriffinsStanley A Dalton

## 2014-05-30 NOTE — Telephone Encounter (Signed)
I have faxed letter.  [facility]. Called pt and is aware and nothing further needed

## 2014-05-30 NOTE — Telephone Encounter (Signed)
Done.  [name], please send this to the number in the note.  Thanks.

## 2014-06-10 ENCOUNTER — Telehealth: Payer: Self-pay | Admitting: Pulmonary Disease

## 2014-06-10 NOTE — Telephone Encounter (Signed)
I have re faxed letter to fax # provided above. He needed nothing further

## 2014-10-31 ENCOUNTER — Ambulatory Visit: Payer: BC Managed Care – PPO | Admitting: Pulmonary Disease

## 2014-11-17 ENCOUNTER — Ambulatory Visit (INDEPENDENT_AMBULATORY_CARE_PROVIDER_SITE_OTHER): Payer: BLUE CROSS/BLUE SHIELD | Admitting: Pulmonary Disease

## 2014-11-17 ENCOUNTER — Encounter: Payer: Self-pay | Admitting: Pulmonary Disease

## 2014-11-17 ENCOUNTER — Encounter (INDEPENDENT_AMBULATORY_CARE_PROVIDER_SITE_OTHER): Payer: Self-pay

## 2014-11-17 VITALS — BP 132/68 | HR 72 | Temp 98.7°F | Wt 257.2 lb

## 2014-11-17 DIAGNOSIS — G4733 Obstructive sleep apnea (adult) (pediatric): Secondary | ICD-10-CM

## 2014-11-17 NOTE — Patient Instructions (Signed)
Continue on cpap, and keep up with mask changes and supplies. Keep working on weight loss followup with Dr. Sood in one year.  

## 2014-11-17 NOTE — Progress Notes (Signed)
   Subjective:    Patient ID: Richard Nolan., male    DOB: 01/22/1956, 59 y.o.   MRN: [id number]  HPI The patient comes in today for follow-up of his obstructive sleep apnea. He has been wearing C Pap compliantly, and is very satisfied with his sleep and daytime alertness. He denies any issues with his mask fit or pressure, and has been keeping up with his supplies on a regular basis. The patient's weight is down 6 pounds from the last visit.   Review of Systems  Constitutional: Negative for fever and unexpected weight change.  HENT: Negative for congestion, dental problem, ear pain, nosebleeds, postnasal drip, rhinorrhea, sinus pressure, sneezing, sore throat and trouble swallowing.   Eyes: Negative for redness and itching.  Respiratory: Negative for cough, chest tightness, shortness of breath and wheezing.   Cardiovascular: Negative for palpitations and leg swelling.  Gastrointestinal: Negative for nausea and vomiting.  Genitourinary: Negative for dysuria.  Musculoskeletal: Negative for joint swelling.  Skin: Negative for rash.  Neurological: Negative for headaches.  Hematological: Does not bruise/bleed easily.  Psychiatric/Behavioral: Negative for dysphoric mood. The patient is not nervous/anxious.        Objective:   Physical Exam Overweight male in no acute distress Nose without purulence or discharge noted Neck without lymphadenopathy or thyromegaly No skin breakdown or pressure necrosis from the C Pap mask Lower extremities without edema, no cyanosis Alert and oriented, moves all 4 extremities.       Assessment & Plan:

## 2014-11-17 NOTE — Assessment & Plan Note (Signed)
The patient continues to do well on his current C Pap set up, and is satisfied with his sleep and daytime alertness. He is having no mask fit issues, and is satisfied with his pressure on the automatic setting. I've asked him to continue on his device, and to keep up with his cushion changes and supplies. I've also encouraged him to keep working on weight loss.

## 2015-03-12 ENCOUNTER — Ambulatory Visit (INDEPENDENT_AMBULATORY_CARE_PROVIDER_SITE_OTHER): Payer: BLUE CROSS/BLUE SHIELD | Admitting: Family Medicine

## 2015-03-12 VITALS — BP 122/68 | HR 87 | Temp 98.0°F | Resp 18 | Ht 69.0 in | Wt 262.0 lb

## 2015-03-12 DIAGNOSIS — B0230 Zoster ocular disease, unspecified: Secondary | ICD-10-CM

## 2015-03-12 DIAGNOSIS — B029 Zoster without complications: Secondary | ICD-10-CM | POA: Diagnosis not present

## 2015-03-12 MED ORDER — VALACYCLOVIR HCL 1 G PO TABS
1000.0000 mg | ORAL_TABLET | Freq: Three times a day (TID) | ORAL | Status: DC
Start: 2015-03-12 — End: 2015-05-03

## 2015-03-12 MED ORDER — GABAPENTIN 300 MG PO CAPS: 300.0000 mg | ORAL_CAPSULE | Freq: Every day | ORAL | Status: DC

## 2015-03-12 NOTE — Patient Instructions (Signed)
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
## 2015-04-26 ENCOUNTER — Other Ambulatory Visit: Payer: Self-pay | Admitting: Orthopedic Surgery

## 2015-04-27 ENCOUNTER — Telehealth: Payer: Self-pay

## 2015-04-27 NOTE — Telephone Encounter (Signed)
[employer] faxed in some paperwork to be completed and faxed back to them. I have filled out what I can and highlighted what needs to be completed. I will place in your box today 04/27/15 please return to the FMLA/Disability box at the 102 checkout desk within 5-7 business days. Thank you!

## 2015-05-03 ENCOUNTER — Encounter (HOSPITAL_BASED_OUTPATIENT_CLINIC_OR_DEPARTMENT_OTHER): Payer: Self-pay | Admitting: *Deleted

## 2015-05-05 ENCOUNTER — Encounter (INDEPENDENT_AMBULATORY_CARE_PROVIDER_SITE_OTHER): Payer: Self-pay | Admitting: *Deleted

## 2015-05-08 NOTE — Telephone Encounter (Signed)
Please ask pt how now he was out of work with this. We saw him Wynelle LinkSun 9/11 and he was out Mon 9/12 I know but need to know when he is wanting the disability claim for. We did not give him a work note nor advise him beyond above - left it up to pt.

## 2015-05-09 ENCOUNTER — Encounter (HOSPITAL_BASED_OUTPATIENT_CLINIC_OR_DEPARTMENT_OTHER): Payer: Self-pay | Admitting: Orthopedic Surgery

## 2015-05-09 ENCOUNTER — Ambulatory Visit (HOSPITAL_BASED_OUTPATIENT_CLINIC_OR_DEPARTMENT_OTHER): Payer: BLUE CROSS/BLUE SHIELD | Admitting: Anesthesiology

## 2015-05-09 ENCOUNTER — Ambulatory Visit (HOSPITAL_BASED_OUTPATIENT_CLINIC_OR_DEPARTMENT_OTHER)
Admission: RE | Admit: 2015-05-09 | Discharge: 2015-05-09 | Disposition: A | Discharge status: Home / Self Care | Payer: BLUE CROSS/BLUE SHIELD | Source: Ambulatory Visit | Attending: Orthopedic Surgery | Admitting: Orthopedic Surgery

## 2015-05-09 ENCOUNTER — Encounter (HOSPITAL_BASED_OUTPATIENT_CLINIC_OR_DEPARTMENT_OTHER)
Admission: RE | Disposition: A | Discharge status: Home / Self Care | Payer: Self-pay | Source: Ambulatory Visit | Attending: Orthopedic Surgery

## 2015-05-09 DIAGNOSIS — Z888 Allergy status to other drugs, medicaments and biological substances status: Secondary | ICD-10-CM | POA: Insufficient documentation

## 2015-05-09 DIAGNOSIS — G4733 Obstructive sleep apnea (adult) (pediatric): Secondary | ICD-10-CM | POA: Diagnosis not present

## 2015-05-09 DIAGNOSIS — M65332 Trigger finger, left middle finger: Secondary | ICD-10-CM | POA: Insufficient documentation

## 2015-05-09 DIAGNOSIS — M65322 Trigger finger, left index finger: Secondary | ICD-10-CM | POA: Diagnosis not present

## 2015-05-09 HISTORY — DX: Other synovitis and tenosynovitis, left hand: M65.842

## 2015-05-09 HISTORY — PX: TRIGGER FINGER RELEASE: SHX641

## 2015-05-09 SURGERY — RELEASE, A1 PULLEY, FOR TRIGGER FINGER
Anesthesia: Monitor Anesthesia Care | Site: Finger | Laterality: Left

## 2015-05-09 MED ORDER — FENTANYL CITRATE (PF) 100 MCG/2ML IJ SOLN
INTRAMUSCULAR | Status: AC
  Filled 2015-05-09: qty 4

## 2015-05-09 MED ORDER — GLYCOPYRROLATE 0.2 MG/ML IJ SOLN: 0.2000 mg | Freq: Once | INTRAMUSCULAR | Status: DC | PRN

## 2015-05-09 MED ORDER — OXYCODONE HCL 5 MG PO TABS
5.0000 mg | ORAL_TABLET | Freq: Once | ORAL | Status: DC | PRN
Start: 2015-05-09 — End: 2015-05-09

## 2015-05-09 MED ORDER — CEFAZOLIN SODIUM-DEXTROSE 2-3 GM-% IV SOLR
INTRAVENOUS | Status: AC
  Filled 2015-05-09: qty 50

## 2015-05-09 MED ORDER — CHLORHEXIDINE GLUCONATE 4 % EX LIQD: 60.0000 mL | Freq: Once | CUTANEOUS | Status: DC

## 2015-05-09 MED ORDER — MIDAZOLAM HCL 2 MG/2ML IJ SOLN
1.0000 mg | INTRAMUSCULAR | Status: DC | PRN
  Administered 2015-05-09: 2 mg via INTRAVENOUS

## 2015-05-09 MED ORDER — HYDROMORPHONE HCL 1 MG/ML IJ SOLN: 0.2500 mg | INTRAMUSCULAR | Status: DC | PRN

## 2015-05-09 MED ORDER — PROMETHAZINE HCL 25 MG/ML IJ SOLN: 6.2500 mg | INTRAMUSCULAR | Status: DC | PRN

## 2015-05-09 MED ORDER — ONDANSETRON HCL 4 MG/2ML IJ SOLN
INTRAMUSCULAR | Status: AC
  Filled 2015-05-09: qty 2

## 2015-05-09 MED ORDER — OXYCODONE HCL 5 MG/5ML PO SOLN: 5.0000 mg | Freq: Once | ORAL | Status: DC | PRN

## 2015-05-09 MED ORDER — BUPIVACAINE HCL (PF) 0.25 % IJ SOLN
INTRAMUSCULAR | Status: AC
  Filled 2015-05-09: qty 150

## 2015-05-09 MED ORDER — ONDANSETRON HCL 4 MG/2ML IJ SOLN
INTRAMUSCULAR | Status: DC | PRN
  Administered 2015-05-09: 4 mg via INTRAVENOUS

## 2015-05-09 MED ORDER — CEFAZOLIN SODIUM-DEXTROSE 2-3 GM-% IV SOLR: 2.0000 g | INTRAVENOUS | Status: DC

## 2015-05-09 MED ORDER — CEFAZOLIN SODIUM-DEXTROSE 2-3 GM-% IV SOLR
2.0000 g | INTRAVENOUS | Status: AC
  Administered 2015-05-09: 2 g via INTRAVENOUS

## 2015-05-09 MED ORDER — MIDAZOLAM HCL 2 MG/2ML IJ SOLN
INTRAMUSCULAR | Status: AC
  Filled 2015-05-09: qty 4

## 2015-05-09 MED ORDER — LACTATED RINGERS IV SOLN
INTRAVENOUS | Status: DC
  Administered 2015-05-09: 09:00:00 via INTRAVENOUS

## 2015-05-09 MED ORDER — FENTANYL CITRATE (PF) 100 MCG/2ML IJ SOLN
50.0000 ug | INTRAMUSCULAR | Status: DC | PRN
  Administered 2015-05-09: 100 ug via INTRAVENOUS

## 2015-05-09 MED ORDER — HYDROCODONE-ACETAMINOPHEN 5-325 MG PO TABS: 1.0000 | ORAL_TABLET | Freq: Four times a day (QID) | ORAL | Status: DC | PRN

## 2015-05-09 MED ORDER — SCOPOLAMINE 1 MG/3DAYS TD PT72
1.0000 | MEDICATED_PATCH | Freq: Once | TRANSDERMAL | Status: DC | PRN
Start: 2015-05-09 — End: 2015-05-09

## 2015-05-09 MED ORDER — PROPOFOL 10 MG/ML IV BOLUS
INTRAVENOUS | Status: AC
  Filled 2015-05-09: qty 20

## 2015-05-09 MED ORDER — PROPOFOL 10 MG/ML IV BOLUS
INTRAVENOUS | Status: DC | PRN
  Administered 2015-05-09: 30 mg via INTRAVENOUS

## 2015-05-09 MED ORDER — BUPIVACAINE HCL (PF) 0.25 % IJ SOLN
INTRAMUSCULAR | Status: DC | PRN
  Administered 2015-05-09: 7 mL

## 2015-05-09 MED ORDER — LIDOCAINE HCL (PF) 0.5 % IJ SOLN
INTRAMUSCULAR | Status: DC | PRN
  Administered 2015-05-09: 30 mL via INTRAVENOUS

## 2015-05-09 SURGICAL SUPPLY — 36 items
BANDAGE COBAN STERILE 2 (GAUZE/BANDAGES/DRESSINGS) ×3 IMPLANT
BLADE SURG 15 STRL LF DISP TIS (BLADE) ×1 IMPLANT
BLADE SURG 15 STRL SS (BLADE) ×3
BNDG CMPR 9X4 STRL LF SNTH (GAUZE/BANDAGES/DRESSINGS) ×1
BNDG ESMARK 4X9 LF (GAUZE/BANDAGES/DRESSINGS) ×2 IMPLANT
BRUSH SCRUB EZ PLAIN DRY (MISCELLANEOUS) ×3 IMPLANT
CHLORAPREP W/TINT 26ML (MISCELLANEOUS) ×3 IMPLANT
CORDS BIPOLAR (ELECTRODE) IMPLANT
COVER BACK TABLE 60X90IN (DRAPES) ×3 IMPLANT
COVER MAYO STAND STRL (DRAPES) ×3 IMPLANT
CUFF TOURNIQUET SINGLE 18IN (TOURNIQUET CUFF) ×3 IMPLANT
DECANTER SPIKE VIAL GLASS SM (MISCELLANEOUS) IMPLANT
DRAPE EXTREMITY T 121X128X90 (DRAPE) ×3 IMPLANT
DRAPE SURG 17X23 STRL (DRAPES) ×3 IMPLANT
GAUZE SPONGE 4X4 12PLY STRL (GAUZE/BANDAGES/DRESSINGS) ×3 IMPLANT
GAUZE XEROFORM 1X8 LF (GAUZE/BANDAGES/DRESSINGS) ×3 IMPLANT
GLOVE BIOGEL PI IND STRL 7.0 (GLOVE) ×1 IMPLANT
GLOVE BIOGEL PI IND STRL 8.5 (GLOVE) ×1 IMPLANT
GLOVE BIOGEL PI INDICATOR 7.0 (GLOVE) ×2
GLOVE BIOGEL PI INDICATOR 8.5 (GLOVE) ×2
GLOVE ECLIPSE 6.5 STRL STRAW (GLOVE) ×3 IMPLANT
GLOVE SURG ORTHO 8.0 STRL STRW (GLOVE) ×3 IMPLANT
GOWN STRL REUS W/ TWL LRG LVL3 (GOWN DISPOSABLE) ×1 IMPLANT
GOWN STRL REUS W/TWL LRG LVL3 (GOWN DISPOSABLE) ×3
GOWN STRL REUS W/TWL XL LVL3 (GOWN DISPOSABLE) ×3 IMPLANT
NDL PRECISIONGLIDE 27X1.5 (NEEDLE) ×1 IMPLANT
NEEDLE PRECISIONGLIDE 27X1.5 (NEEDLE) ×3 IMPLANT
NS IRRIG 1000ML POUR BTL (IV SOLUTION) ×3 IMPLANT
PACK BASIN DAY SURGERY FS (CUSTOM PROCEDURE TRAY) ×3 IMPLANT
STOCKINETTE 4X48 STRL (DRAPES) ×3 IMPLANT
SUT ETHILON 4 0 PS 2 18 (SUTURE) ×4 IMPLANT
SYR BULB 3OZ (MISCELLANEOUS) ×3 IMPLANT
SYR CONTROL 10ML LL (SYRINGE) ×3 IMPLANT
TOWEL OR 17X24 6PK STRL BLUE (TOWEL DISPOSABLE) ×6 IMPLANT
TRAY DSU PREP LF (CUSTOM PROCEDURE TRAY) ×2 IMPLANT
UNDERPAD 30X30 (UNDERPADS AND DIAPERS) ×3 IMPLANT

## 2015-05-09 NOTE — Op Note (Signed)
NAME:  Richard Nolan, Richard Nolan                  ACCOUNT NO.:  [id number]  MEDICAL RECORD NO.:  [id number]  LOCATION:                                 FACILITY:  PHYSICIAN:  Cindee SaltGary Jenavee Laguardia, M.D.            DATE OF BIRTH:  DATE OF CONSULTATION:  05/09/2015 DATE OF DISCHARGE:                                CONSULTATION   PREOPERATIVE DIAGNOSIS:  Stenosing tenosynovitis, left index, left middle fingers.  POSTOPERATIVE DIAGNOSIS:  Stenosing tenosynovitis, left index, left middle fingers.  OPERATION:  Release of A1 pulley, left index, left middle finger.  SURGEON:  Cindee SaltGary Jimmi Sidener, M.D.  ASSISTANT:  None.  ANESTHESIA:  Forearm IV regional with sedation and local infiltration.  ANESTHESIOLOGIST:  Zenon MayoW. Edmond Fitzgerald, MD  HISTORY:  The patient is a 59 year old male with a history of triggering of his index finger and the middle finger.  He has elected undergo surgical decompression of the A1 pulleys.  Pre, peri, and postoperative course have been discussed along with risks and complications.  He is aware that there is no guarantee with the surgery, possibility of infection; recurrence of injury to arteries, nerves, tendons; incomplete relief of symptoms; dystrophy.  In the preoperative area, the patient is seen, the extremity marked by both the patient and surgeon.  Antibiotic given.  PROCEDURE IN DETAIL:  The patient was brought to the operating room, where a forearm-based IV regional anesthetic was carried out without difficulty.  He was scrubbed and prepped with ChloraPrep in a supine position with the left arm free.  A 3-minute dry time was allowed.  Time- out taken confirming the patient and procedure.  After adequate anesthesia was afforded, an oblique incision was made over the A1 pulley of the middle finger, carried down through subcutaneous tissue. Bleeders were electrocauterized with bipolar.  The A1 pulley was found to be markedly thickened, and this was released on its radial aspect.   A small incision was made centrally in A2 and a partial tenosynovectomy performed proximally.  The finger placed through a full passive motion with separation of the 2 tendons and no further triggering was noted.  A separate incision was then made on the index finger over the A1 pulley carried down through subcutaneous tissue.  Bleeders again electrocauterized with bipolar.  Neurovascular structures identified and protected with retractors.  An incision made on the radial aspect of the A1 pulley, which again was markedly thickened.  A small incision was made centrally in A2 and a partial tenosynovectomy performed proximally. Again, the finger placed through a full range motion after separation of the two flexor tendons, no further triggering was noted.  The wounds were copiously irrigated with saline and the skin closed with interrupted 4-0 nylon sutures.  Local infiltration with 0.25% bupivacaine without epinephrine was given, approximately 8 mL was used.  Sterile compressive dressing with the fingers free was applied.  On deflation of the tourniquet, all fingers immediately pinked.  He was taken to the recovery room for observation in a satisfactory condition.  He will be discharged [facility] [place].  ______________________________ Cindee Salt, M.D.     GK/MEDQ  D:  05/09/2015  T:  05/09/2015  Job:  [id number]

## 2015-05-09 NOTE — Brief Op Note (Signed)
05/09/2015  9:20 AM  PATIENT:  Richard PimpleIra T Lupien Jr.  59 y.o. male  PRE-OPERATIVE DIAGNOSIS:  Stenosing Tenosynovitis Left Index Left middle finger  POST-OPERATIVE DIAGNOSIS:  Stenosing Tenosynovitis Left Index Left middle finger  PROCEDURE:  Procedure(s): RELEASE TRIGGER FINGER/A-1 PULLEY, left index finger and left middle finger (Left)  SURGEON:  Surgeon(s) and Role:    * Cindee SaltGary Tacari Repass, MD - Primary  PHYSICIAN ASSISTANT:   ASSISTANTS: none   ANESTHESIA:   local and regional  EBL:  Total I/O In: 800 [I.V.:800] Out: -   BLOOD ADMINISTERED:none  DRAINS: none   LOCAL MEDICATIONS USED:  BUPIVICAINE   SPECIMEN:  No Specimen  DISPOSITION OF SPECIMEN:  N/A  COUNTS:  YES  TOURNIQUET:   Total Tourniquet Time Documented: Forearm (Left) - 27 minutes Total: Forearm (Left) - 27 minutes   DICTATION: .Other Dictation: Dictation Number [id number]  PLAN OF CARE: Discharge to home after PACU  PATIENT DISPOSITION:  PACU - hemodynamically stable.

## 2015-05-09 NOTE — Anesthesia Preprocedure Evaluation (Signed)
Anesthesia Evaluation  Patient identified by MRN, date of birth, ID band Patient awake    Reviewed: Allergy & Precautions, NPO status , Patient's Chart, lab work & pertinent test results  Airway Mallampati: III  TM Distance: >3 FB Neck ROM: Full    Dental   Pulmonary sleep apnea and Continuous Positive Airway Pressure Ventilation ,    breath sounds clear to auscultation       Cardiovascular negative cardio ROS   Rhythm:Regular Rate:Normal     Neuro/Psych negative neurological ROS     GI/Hepatic negative GI ROS, Neg liver ROS,   Endo/Other  negative endocrine ROS  Renal/GU negative Renal ROS     Musculoskeletal   Abdominal   Peds  Hematology negative hematology ROS (+)   Anesthesia Other Findings   Reproductive/Obstetrics                             Anesthesia Physical Anesthesia Plan  ASA: II  Anesthesia Plan: MAC and Bier Block   Post-op Pain Management:    Induction: Intravenous  Airway Management Planned: Natural Airway and Simple Face Mask  Additional Equipment:   Intra-op Plan:   Post-operative Plan: Post-operative intubation/ventilation and Extubation in OR  Informed Consent: I have reviewed the patients History and Physical, chart, labs and discussed the procedure including the risks, benefits and alternatives for the proposed anesthesia with the patient or authorized representative who has indicated his/her understanding and acceptance.     Plan Discussed with: CRNA  Anesthesia Plan Comments:         Anesthesia Quick Evaluation

## 2015-05-09 NOTE — H&P (Signed)
  [name] is a 59 year-old right-hand dominant male referred by Dr. Carylon Perchesoy Fagan for consultation with respect to catching of his left middle finger. This has been going on for 18 months.  He feels it began after stacking wood.  He has had this injected on two occasions by Dr. Melrose NakayamaKindle with good relief temporarily.  He has no history of diabetes, thyroid problems, arthritis or gout.  He complains of a moderate dull aching type pain with a feeling of a mass.  Rest makes it better.  ALLERGIES:  Vioxx.  MEDICATIONS:     Fish oil, glucosamine and chondroitin sulfate, Tylenol and Mucinex, Sudafed.  He has had umbilical hernia repair.    SURGICAL HISTORY:      FAMILY MEDICAL HISTORY:    Positive for heart disease, otherwise negative.  SOCIAL HISTORY:     He does not smoke or drink.  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  REVIEW OF SYSTEMS:    Positive for glasses, otherwise negative 14 points. Richard PimpleIra T Meaney Jr. is an 59 y.o. male.   Chief Complaint: catching index and middle fingers left hand HPI: see above  Past Medical History  Diagnosis Date  . OSA (obstructive sleep apnea)     CPAP nightly  . Stenosing tenosynovitis of finger of left hand     left index and left middle fingers    Past Surgical History  Procedure Laterality Date  . Umbilical hernia repair    . Hernia repair      Family History  Problem Relation Age of Onset  . Heart disease Father    Social History:  reports that he has never smoked. He does not have any smokeless tobacco history on file. He reports that he does not drink alcohol or use illicit drugs.  Allergies:  Allergies  Allergen Reactions  . Vioxx [Rofecoxib] Anaphylaxis    Throat swelling    No prescriptions prior to admission    No results found for this or any previous visit (from the past 48 hour(s)).  No results found.   Pertinent items are noted in HPI.  Height 5\' 10"  (1.778 m), weight 117.935 kg (260 lb).  General appearance: alert,  cooperative and appears stated age Head: Normocephalic, without obvious abnormality Neck: no JVD Resp: clear to auscultation bilaterally Cardio: regular rate and rhythm, S1, S2 normal, no murmur, click, rub or gallop GI: soft, non-tender; bowel sounds normal; no masses,  no organomegaly Extremities: catching index and middle fingers left hand Pulses: 2+ and symmetric Skin: Skin color, texture, turgor normal. No rashes or lesions Neurologic: Grossly normal Incision/Wound: na  Assessment/Plan RADIOGRAPHS:      X-rays of his middle finger reveal no degenerative changes.  DIAGNOSIS:      Stenosing tenosynovitis left middle, left index fingers.    RECOMMENDATIONS/PLAN:   We have discussed this with him. He would like to have this surgically released. He is aware that the third injection will not cure this for him. We would recommend both fingers at the same sitting. This can be done under regional anesthesia.   He is aware there is no guarantee with the surgery, possibility of infection, recurrence, injury to arteries, nerves, tendons, incomplete relief of symptoms and dystrophy.  He is scheduled for release A-1 pulley, left middle, left index finger.  Richard Nolan R 05/09/2015, 3:54 AM

## 2015-05-09 NOTE — Discharge Instructions (Signed)

## 2015-05-09 NOTE — Telephone Encounter (Signed)
Placed in disabilty box.

## 2015-05-09 NOTE — Telephone Encounter (Signed)
Forms received, scanned, and [facility] along with the patient's office visit notes from 03/12/2015.

## 2015-05-09 NOTE — Op Note (Signed)
Dictation Number [id number]

## 2015-05-09 NOTE — Anesthesia Postprocedure Evaluation (Signed)
  Anesthesia Post-op Note  Patient: Richard Nolan T Arko Jr.  Procedure(s) Performed: Procedure(s): RELEASE TRIGGER FINGER/A-1 PULLEY, left index finger and left middle finger (Left)  Patient Location: PACU  Anesthesia Type:MAC and Bier block  Level of Consciousness: awake and alert   Airway and Oxygen Therapy: Patient Spontanous Breathing  Post-op Pain: none  Post-op Assessment: Post-op Vital signs reviewed              Post-op Vital Signs: Reviewed  Last Vitals:  Filed Vitals:   05/09/15 1002  BP: 127/84  Pulse: 90  Temp: 36.5 C  Resp: 20    Complications: No apparent anesthesia complications

## 2015-05-09 NOTE — Transfer of Care (Signed)
Immediate Anesthesia Transfer of Care Note  Patient: Richard Nolan T Heldt Jr.  Procedure(s) Performed: Procedure(s): RELEASE TRIGGER FINGER/A-1 PULLEY, left index finger and left middle finger (Left)  Patient Location: PACU  Anesthesia Type:MAC and Bier block  Level of Consciousness: sedated  Airway & Oxygen Therapy: Patient Spontanous Breathing and Patient connected to face mask oxygen  Post-op Assessment: Report given to RN and Post -op Vital signs reviewed and stable  Post vital signs: Reviewed and stable  Last Vitals:  Filed Vitals:   05/09/15 0728  BP: 140/83  Pulse: 71  Resp: 20    Complications: No apparent anesthesia complications

## 2015-05-10 ENCOUNTER — Encounter (HOSPITAL_BASED_OUTPATIENT_CLINIC_OR_DEPARTMENT_OTHER): Payer: Self-pay | Admitting: Orthopedic Surgery

## 2015-05-22 ENCOUNTER — Encounter (INDEPENDENT_AMBULATORY_CARE_PROVIDER_SITE_OTHER): Payer: Self-pay | Admitting: *Deleted

## 2015-05-22 DIAGNOSIS — M6530 Trigger finger, unspecified finger: Secondary | ICD-10-CM | POA: Insufficient documentation

## 2015-05-24 ENCOUNTER — Other Ambulatory Visit (INDEPENDENT_AMBULATORY_CARE_PROVIDER_SITE_OTHER): Payer: Self-pay | Admitting: *Deleted

## 2015-05-24 DIAGNOSIS — Z1211 Encounter for screening for malignant neoplasm of colon: Secondary | ICD-10-CM

## 2015-05-31 ENCOUNTER — Telehealth (INDEPENDENT_AMBULATORY_CARE_PROVIDER_SITE_OTHER): Payer: Self-pay | Admitting: *Deleted

## 2015-05-31 ENCOUNTER — Other Ambulatory Visit (INDEPENDENT_AMBULATORY_CARE_PROVIDER_SITE_OTHER): Payer: Self-pay | Admitting: *Deleted

## 2015-05-31 ENCOUNTER — Telehealth: Payer: Self-pay | Admitting: Pulmonary Disease

## 2015-05-31 NOTE — Telephone Encounter (Signed)
lmtcb x1 

## 2015-05-31 NOTE — Telephone Encounter (Signed)
Patient returned call and wanted to let us know that he was going to be faxing over DOT forms that need to be completed and faxed back. Patient stated that he would need them by the first of the year. I explained to him that VS is not in the office everyday but will be in the office on 06/01/15 and 06/08/15. He says that is fine and there is no rush to have them done immediately. I verified the fax as [phone] with attention Ashtyn. Patient voiced understanding and had no further questions. [facility] for FYI.

## 2015-05-31 NOTE — Telephone Encounter (Signed)
Referring MD/PCP: fagan   Procedure: tcs  Reason/Indication:  screening  Has patient had this procedure before?  Yes, 2006   If so, when, by whom and where?    Is there a family history of colon cancer?  no  Who?  What age when diagnosed?    Is patient diabetic?   no      Does patient have prosthetic heart valve or mechanical valve?  no  Do you have a pacemaker?  no  Has patient ever had endocarditis? no  Has patient had joint replacement within last 12 months?  no  Does patient tend to be constipated or take laxatives? no  Does patient have a history of alcohol/drug use?  no  Is patient on Coumadin, Plavix and/or Aspirin? no  Medications: tylenol prn, c-pap machine  Allergies: vioxx  Medication Adjustment:   Procedure date & time: 06/27/15 at 1030

## 2015-06-01 NOTE — Telephone Encounter (Signed)
Checked VS look at and front office folder and forms have not been received Awaiting forms

## 2015-06-02 NOTE — Telephone Encounter (Signed)
Checked Dr. Sood's box and asEvlyn Courierked Dr. Craige CottaSood, but we have not received these forms.   Called patient and he says that he has not faxed the forms yet. He says that he will fax it to us sometime next week. Will hold in Ashtyn's box to await forms.

## 2015-06-05 NOTE — Telephone Encounter (Signed)
agree

## 2015-06-06 NOTE — Telephone Encounter (Signed)
Pt aware that we have received his CPAP download. Pt states that all he needs is a letter stating that he is compliant and needs this faxed back to Dr Pernell DupreAdams office (information on cover sheet) by 06/19/15. Papers are in Dr Evlyn CourierSood's look at folder. Please advise. Thanks.

## 2015-06-13 ENCOUNTER — Encounter: Payer: Self-pay | Admitting: Pulmonary Disease

## 2015-06-13 NOTE — Telephone Encounter (Signed)
Patient notified. Letter faxed to Dr. Pernell DupreAdams Nothing further needed. Closing encounter

## 2015-06-13 NOTE — Telephone Encounter (Signed)
CPAP 12/01/14 to 05/29/15 >> used on 180 of 180 nights with average 6 hrs and 18 min.  Average AHI is 0.6 with CPAP 10 cm H2O.  Please inform pt that I have completed letter regarding CPAP compliance.  Please have this faxed to Dr. Pernell DupreAdams at [phone].

## 2015-06-27 ENCOUNTER — Encounter (HOSPITAL_COMMUNITY)
Admission: RE | Disposition: A | Discharge status: Home / Self Care | Payer: Self-pay | Source: Ambulatory Visit | Attending: Internal Medicine

## 2015-06-27 ENCOUNTER — Ambulatory Visit (HOSPITAL_COMMUNITY)
Admission: RE | Admit: 2015-06-27 | Discharge: 2015-06-27 | Disposition: A | Discharge status: Home / Self Care | Payer: BLUE CROSS/BLUE SHIELD | Source: Ambulatory Visit | Attending: Internal Medicine | Admitting: Internal Medicine

## 2015-06-27 ENCOUNTER — Encounter (HOSPITAL_COMMUNITY): Payer: Self-pay | Admitting: *Deleted

## 2015-06-27 DIAGNOSIS — K644 Residual hemorrhoidal skin tags: Secondary | ICD-10-CM | POA: Insufficient documentation

## 2015-06-27 DIAGNOSIS — K648 Other hemorrhoids: Secondary | ICD-10-CM | POA: Diagnosis not present

## 2015-06-27 DIAGNOSIS — Z1211 Encounter for screening for malignant neoplasm of colon: Secondary | ICD-10-CM | POA: Insufficient documentation

## 2015-06-27 DIAGNOSIS — G4733 Obstructive sleep apnea (adult) (pediatric): Secondary | ICD-10-CM | POA: Insufficient documentation

## 2015-06-27 HISTORY — PX: COLONOSCOPY: SHX5424

## 2015-06-27 SURGERY — COLONOSCOPY
Anesthesia: Moderate Sedation

## 2015-06-27 MED ORDER — MEPERIDINE HCL 50 MG/ML IJ SOLN
INTRAMUSCULAR | Status: DC | PRN
  Administered 2015-06-27 (×2): 25 mg via INTRAVENOUS

## 2015-06-27 MED ORDER — MIDAZOLAM HCL 5 MG/5ML IJ SOLN
INTRAMUSCULAR | Status: AC
  Filled 2015-06-27: qty 10

## 2015-06-27 MED ORDER — SODIUM CHLORIDE 0.9 % IV SOLN
INTRAVENOUS | Status: DC
  Administered 2015-06-27: 1000 mL via INTRAVENOUS

## 2015-06-27 MED ORDER — STERILE WATER FOR IRRIGATION IR SOLN
Status: DC | PRN
  Administered 2015-06-27: 11:00:00

## 2015-06-27 MED ORDER — MIDAZOLAM HCL 5 MG/5ML IJ SOLN
INTRAMUSCULAR | Status: DC | PRN
  Administered 2015-06-27 (×2): 2 mg via INTRAVENOUS
  Administered 2015-06-27: 1 mg via INTRAVENOUS

## 2015-06-27 MED ORDER — MEPERIDINE HCL 50 MG/ML IJ SOLN
INTRAMUSCULAR | Status: AC
  Filled 2015-06-27: qty 1

## 2015-06-27 NOTE — H&P (Signed)
Richard PimpleIra T Stave Jr. is an 59 y.o. male.   Chief Complaint: Patient is here for colonoscopy. HPI: 59 year old Caucasian male who is here for screening colonoscopy. He denies abdominal pain change in bowel habits or rectal bleeding. Last exam was 10 years ago. Family history is negative for CRC.  Past Medical History  Diagnosis Date  . OSA (obstructive sleep apnea)     CPAP nightly  . Stenosing tenosynovitis of finger of left hand     left index and left middle fingers    Past Surgical History  Procedure Laterality Date  . Umbilical hernia repair    . Hernia repair    . Trigger finger release Left 05/09/2015    Procedure: RELEASE TRIGGER FINGER/A-1 PULLEY, left index finger and left middle finger;  Surgeon: Cindee SaltGary Kuzma, MD;  Location: [facility];  Service: Orthopedics;  Laterality: Left;    Family History  Problem Relation Age of Onset  . Heart disease Father    Social History:  reports that he has never smoked. He does not have any smokeless tobacco history on file. He reports that he does not drink alcohol or use illicit drugs.  Allergies:  Allergies  Allergen Reactions  . Vioxx [Rofecoxib] Anaphylaxis    Throat swelling    Medications Prior to Admission  Medication Sig Dispense Refill  . fluticasone (FLONASE) 50 MCG/ACT nasal spray 1 spray each nostril twice a day (Patient taking differently: Place 1 spray into both nostrils daily as needed for allergies. 1 spray each nostril once weekly) 48 g 1  . Omega-3 Fatty Acids (FISH OIL) 1000 MG CAPS Take 1 capsule by mouth daily.      Marland Kitchen. HYDROcodone-acetaminophen (NORCO) 5-325 MG tablet Take 1 tablet by mouth every 6 (six) hours as needed for moderate pain. 30 tablet 0    No results found for this or any previous visit (from the past 48 hour(s)). No results found.  ROS  Blood pressure 121/66, pulse 74, temperature 97.9 F (36.6 C), temperature source Oral, resp. rate 17, height 5\' 10"  (1.778 m), weight 250 lb  (113.399 kg), SpO2 99 %. Physical Exam  Constitutional: He appears well-developed and well-nourished.  HENT:  Mouth/Throat: Oropharynx is clear and moist.  Eyes: Conjunctivae are normal. No scleral icterus.  Neck: No thyromegaly present.  Cardiovascular: Normal rate, regular rhythm and normal heart sounds.   No murmur heard. Respiratory: Effort normal and breath sounds normal.  GI: Soft. He exhibits no distension and no mass. There is no tenderness.  Musculoskeletal: He exhibits no edema.  Lymphadenopathy:    He has no cervical adenopathy.  Neurological: He is alert.  Skin: Skin is warm and dry.     Assessment/Plan Average risk screening colonoscopy.  Richard Nolan 06/27/2015, 11:20 AM

## 2015-06-27 NOTE — Discharge Instructions (Signed)
Resume usual medications and diet. No driving for 24 hours. Next screening exam in 10 years. Colonoscopy, Care After Refer to this sheet in the next few weeks. These instructions provide you with information on caring for yourself after your procedure. Your health care provider may also give you more specific instructions. Your treatment has been planned according to current medical practices, but problems sometimes occur. Call your health care provider if you have any problems or questions after your procedure. WHAT TO EXPECT AFTER THE PROCEDURE  After your procedure, it is typical to have the following:  A small amount of blood in your stool.  Moderate amounts of gas and mild abdominal cramping or bloating. HOME CARE INSTRUCTIONS  Do not drive, operate machinery, or sign important documents for 24 hours.  You may shower and resume your regular physical activities, but move at a slower pace for the first 24 hours.  Take frequent rest periods for the first 24 hours.  Walk around or put a warm pack on your abdomen to help reduce abdominal cramping and bloating.  Drink enough fluids to keep your urine clear or pale yellow.  You may resume your normal diet as instructed by your health care provider. Avoid heavy or fried foods that are hard to digest.  Avoid drinking alcohol for 24 hours or as instructed by your health care provider.  Only take over-the-counter or prescription medicines as directed by your health care provider.  If a tissue sample (biopsy) was taken during your procedure:  Do not take aspirin or blood thinners for 7 days, or as instructed by your health care provider.  Do not drink alcohol for 7 days, or as instructed by your health care provider.  Eat soft foods for the first 24 hours. SEEK MEDICAL CARE IF: You have persistent spotting of blood in your stool 2-3 days after the procedure. SEEK IMMEDIATE MEDICAL CARE IF:  You have more than a small spotting of  blood in your stool.  You pass large blood clots in your stool.  Your abdomen is swollen (distended).  You have nausea or vomiting.  You have a fever.  You have increasing abdominal pain that is not relieved with medicine.   This information is not intended to replace advice given to you by your health care provider. Make sure you discuss any questions you have with your health care provider.   Document Released: 01/30/2004 Document Revised: 04/07/2013 Document Reviewed: 02/22/2013 Elsevier Interactive Patient Education 2016 [employer] Hemorrhoids Hemorrhoids are swollen veins around the rectum or anus. There are two types of hemorrhoids:   Internal hemorrhoids. These occur in the veins just inside the rectum. They may poke through to the outside and become irritated and painful.  External hemorrhoids. These occur in the veins outside the anus and can be felt as a painful swelling or hard lump near the anus. CAUSES  Pregnancy.   Obesity.   Constipation or diarrhea.   Straining to have a bowel movement.   Sitting for long periods on the toilet.  Heavy lifting or other activity that caused you to strain.  Anal intercourse. SYMPTOMS   Pain.   Anal itching or irritation.   Rectal bleeding.   Fecal leakage.   Anal swelling.   One or more lumps around the anus.  DIAGNOSIS  Your caregiver may be able to diagnose hemorrhoids by visual examination. Other examinations or tests that may be performed include:   Examination of the rectal area with a gloved hand (  digital rectal exam).   Examination of anal canal using a small tube (scope).   A blood test if you have lost a significant amount of blood.  A test to look inside the colon (sigmoidoscopy or colonoscopy). TREATMENT Most hemorrhoids can be treated at home. However, if symptoms do not seem to be getting better or if you have a lot of rectal bleeding, your caregiver may perform a procedure to help  make the hemorrhoids get smaller or remove them completely. Possible treatments include:   Placing a rubber band at the base of the hemorrhoid to cut off the circulation (rubber band ligation).   Injecting a chemical to shrink the hemorrhoid (sclerotherapy).   Using a tool to burn the hemorrhoid (infrared light therapy).   Surgically removing the hemorrhoid (hemorrhoidectomy).   Stapling the hemorrhoid to block blood flow to the tissue (hemorrhoid stapling).  HOME CARE INSTRUCTIONS   Eat foods with fiber, such as whole grains, beans, nuts, fruits, and vegetables. Ask your doctor about taking products with added fiber in them (fibersupplements).  Increase fluid intake. Drink enough water and fluids to keep your urine clear or pale yellow.   Exercise regularly.   Go to the bathroom when you have the urge to have a bowel movement. Do not wait.   Avoid straining to have bowel movements.   Keep the anal area dry and clean. Use wet toilet paper or moist towelettes after a bowel movement.   Medicated creams and suppositories may be used or applied as directed.   Only take over-the-counter or prescription medicines as directed by your caregiver.   Take warm sitz baths for 15-20 minutes, 3-4 times a day to ease pain and discomfort.   Place ice packs on the hemorrhoids if they are tender and swollen. Using ice packs between sitz baths may be helpful.   Put ice in a plastic bag.   Place a towel between your skin and the bag.   Leave the ice on for 15-20 minutes, 3-4 times a day.   Do not use a donut-shaped pillow or sit on the toilet for long periods. This increases blood pooling and pain.  SEEK MEDICAL CARE IF:  You have increasing pain and swelling that is not controlled by treatment or medicine.  You have uncontrolled bleeding.  You have difficulty or you are unable to have a bowel movement.  You have pain or inflammation outside the area of the  hemorrhoids. MAKE SURE YOU:  Understand these instructions.  Will watch your condition.  Will get help right away if you are not doing well or get worse.   This information is not intended to replace advice given to you by your health care provider. Make sure you discuss any questions you have with your health care provider.   Document Released: 06/14/2000 Document Revised: 06/03/2012 Document Reviewed: 04/21/2012 Elsevier Interactive Patient Education [employer]

## 2015-06-27 NOTE — Op Note (Signed)
COLONOSCOPY PROCEDURE REPORT  PATIENT:  Richard PimpleIra T Shirer Jr.  MR#:  [id number] Birthdate:  08/22/1955, 59 y.o., male Endoscopist:  Dr. Malissa HippoNajeeb U. Tyrece Vanterpool, MD Referred By:  Dr. Carylon Perchesoy Fagan, MD Procedure Date: 06/27/2015  Procedure:   Colonoscopy  Indications:  Patient is 59 year old Caucasian male who is undergoing average risk screening colonoscopy.  Informed Consent:  The procedure and risks were reviewed with the patient and informed consent was obtained.  Medications:  Demerol 50 mg IV Versed 5 mg IV  Description of procedure:  After a digital rectal exam was performed, that colonoscope was advanced from the anus through the rectum and colon to the area of the cecum, ileocecal valve and appendiceal orifice. The cecum was deeply intubated. These structures were well-seen and photographed for the record. From the level of the cecum and ileocecal valve, the scope was slowly and cautiously withdrawn. The mucosal surfaces were carefully surveyed utilizing scope tip to flexion to facilitate fold flattening as needed. The scope was pulled down into the rectum where a thorough exam including retroflexion was performed.  Findings:  Prep satisfactory. Normal mucosa of cecum, ascending colon, hepatic flexure, transverse colon, splenic flexure, descending and sigmoid colon. Normal rectal mucosa. Fall hemorrhoids below the dentate line.  Therapeutic/Diagnostic Maneuvers Performed:  None  Complications:  None  EBL: none  Cecal Withdrawal Time:  11 minutes  Impression:  Normal colonoscopy except small external hemorrhoids.  Recommendations:  Standard instructions given. Next screening exam in 10 years.  [name] U  06/27/2015 11:47 AM  CC: Dr. Carylon PerchesFAGAN,ROY, MD & Dr. Bonnetta BarryNo ref. provider found

## 2015-06-29 ENCOUNTER — Encounter (HOSPITAL_COMMUNITY): Payer: Self-pay | Admitting: Internal Medicine

## 2015-07-05 ENCOUNTER — Encounter: Payer: Self-pay | Admitting: Pulmonary Disease

## 2015-07-07 ENCOUNTER — Ambulatory Visit (INDEPENDENT_AMBULATORY_CARE_PROVIDER_SITE_OTHER): Payer: BLUE CROSS/BLUE SHIELD | Admitting: Physician Assistant

## 2015-07-07 VITALS — BP 128/80 | HR 70 | Temp 98.2°F | Resp 16 | Ht 68.0 in | Wt 256.0 lb

## 2015-07-07 DIAGNOSIS — H6123 Impacted cerumen, bilateral: Secondary | ICD-10-CM | POA: Diagnosis not present

## 2015-07-07 NOTE — Progress Notes (Signed)
[facility] [address] [place] [address] [phone]  Date:  07/07/2015   Name:  Richard Nolan.   DOB:  1955/11/19   MRN:  [id number]  PCP:  Carylon Perches, MD    Chief Complaint: Cerumen Impaction   History of Present Illness:  This is a 60 y.o. male with PMH allergic rhinitis and OSA who is presenting stating he has impacted wax in right ear x 1 week. States his left ear feels fine. He is having feeling of ear fullness and decreased hearing. This is a recurrent problem for him. He continues to use q-tips despite knowing that they make things worse. He has some uri symptoms leading up to this as well - these are resolvign.  Review of Systems:  Review of Systems See HPI  Patient Active Problem List   Diagnosis Date Noted  . Other disorders of synovium, tendon, and bursa(727.89) 12/14/2012  . OSA (obstructive sleep apnea) 10/22/2010    Prior to Admission medications   Medication Sig Start Date End Date Taking? Authorizing Provider  fluticasone (FLONASE) 50 MCG/ACT nasal spray 1 spray each nostril twice a day Patient taking differently: Place 1 spray into both nostrils daily as needed for allergies. 1 spray each nostril once weekly 04/20/14  Yes Tonye Pearson, MD  Omega-3 Fatty Acids (FISH OIL) 1000 MG CAPS Take 1 capsule by mouth daily.     Yes Historical Provider, MD    Allergies  Allergen Reactions  . Vioxx [Rofecoxib] Anaphylaxis    Throat swelling    Past Surgical History  Procedure Laterality Date  . Umbilical hernia repair    . Hernia repair    . Trigger finger release Left 05/09/2015    Procedure: RELEASE TRIGGER FINGER/A-1 PULLEY, left index finger and left middle finger;  Surgeon: Cindee Salt, MD;  Location: [facility];  Service: Orthopedics;  Laterality: Left;  . Colonoscopy N/A 06/27/2015    Procedure: COLONOSCOPY;  Surgeon: Malissa Hippo, MD;  [facility];  Service: Endoscopy;  Laterality: N/A;  1030     Social History  Substance Use Topics  . Smoking status: Never Smoker   . Smokeless tobacco: Never Used  . Alcohol Use: No    Family History  Problem Relation Age of Onset  . Heart disease Father     Medication list has been reviewed and updated.  Physical Examination:  Physical Exam  Constitutional: He is oriented to person, place, and time. He appears well-developed and well-nourished. No distress.  HENT:  Head: Normocephalic and atraumatic.  Right Ear: Hearing, external ear and ear canal normal.  Left Ear: Hearing, external ear and ear canal normal.  Nose: Nose normal.  Bilateral TMs completely obstructed with cerumen. After wax loosened with colace and lavage performed, TMs and canals clear.  Eyes: Conjunctivae and lids are normal. Right eye exhibits no discharge. Left eye exhibits no discharge. No scleral icterus.  Pulmonary/Chest: Effort normal. No respiratory distress.  Musculoskeletal: Normal range of motion.  Neurological: He is alert and oriented to person, place, and time.  Skin: Skin is warm, dry and intact. No lesion and no rash noted.  Psychiatric: He has a normal mood and affect. His speech is normal and behavior is normal. Thought content normal.    BP 128/80 mmHg  Pulse 70  Temp(Src) 98.2 F (36.8 C) (Oral)  Resp 16  Ht 5\' 8"  (1.727 m)  Wt 256 lb (116.121 kg)  BMI 38.93 kg/m2  SpO2 97%  Assessment and Plan:  1. Cerumen impaction, bilateral Lavage performed and canals and TMs clear now. Symptoms improved. Discussed measures to prevent cerumen impaction.   Roswell MinersNicole V. Dyke BrackettBush, PA-C, MHS [facility]  07/07/2015

## 2015-10-20 ENCOUNTER — Other Ambulatory Visit: Payer: Self-pay

## 2015-10-20 DIAGNOSIS — T485X5A Adverse effect of other anti-common-cold drugs, initial encounter: Principal | ICD-10-CM

## 2015-10-20 DIAGNOSIS — J310 Chronic rhinitis: Secondary | ICD-10-CM

## 2015-10-20 MED ORDER — FLUTICASONE PROPIONATE 50 MCG/ACT NA SUSP: NASAL | Status: DC

## 2016-01-03 ENCOUNTER — Telehealth: Payer: Self-pay | Admitting: Pulmonary Disease

## 2016-01-03 DIAGNOSIS — G4733 Obstructive sleep apnea (adult) (pediatric): Secondary | ICD-10-CM

## 2016-01-03 NOTE — Telephone Encounter (Signed)
Pt requesting order for CPAP mask. Order placed. Nothing further needed.

## 2016-03-07 DIAGNOSIS — M545 Low back pain: Secondary | ICD-10-CM | POA: Diagnosis not present

## 2016-03-18 DIAGNOSIS — M47817 Spondylosis without myelopathy or radiculopathy, lumbosacral region: Secondary | ICD-10-CM | POA: Diagnosis not present

## 2016-03-18 DIAGNOSIS — M47816 Spondylosis without myelopathy or radiculopathy, lumbar region: Secondary | ICD-10-CM | POA: Diagnosis not present

## 2016-03-18 DIAGNOSIS — M5126 Other intervertebral disc displacement, lumbar region: Secondary | ICD-10-CM | POA: Diagnosis not present

## 2016-03-18 DIAGNOSIS — M5137 Other intervertebral disc degeneration, lumbosacral region: Secondary | ICD-10-CM | POA: Diagnosis not present

## 2016-03-26 ENCOUNTER — Ambulatory Visit: Payer: BLUE CROSS/BLUE SHIELD | Admitting: Pulmonary Disease

## 2016-03-30 DIAGNOSIS — G4730 Sleep apnea, unspecified: Secondary | ICD-10-CM | POA: Diagnosis not present

## 2016-03-30 DIAGNOSIS — R7301 Impaired fasting glucose: Secondary | ICD-10-CM | POA: Diagnosis not present

## 2016-03-30 DIAGNOSIS — Z125 Encounter for screening for malignant neoplasm of prostate: Secondary | ICD-10-CM | POA: Diagnosis not present

## 2016-03-30 DIAGNOSIS — Z79899 Other long term (current) drug therapy: Secondary | ICD-10-CM | POA: Diagnosis not present

## 2016-03-30 DIAGNOSIS — K219 Gastro-esophageal reflux disease without esophagitis: Secondary | ICD-10-CM | POA: Diagnosis not present

## 2016-04-12 DIAGNOSIS — G4733 Obstructive sleep apnea (adult) (pediatric): Secondary | ICD-10-CM | POA: Diagnosis not present

## 2016-04-12 DIAGNOSIS — Z6838 Body mass index (BMI) 38.0-38.9, adult: Secondary | ICD-10-CM | POA: Diagnosis not present

## 2016-04-12 DIAGNOSIS — Z0001 Encounter for general adult medical examination with abnormal findings: Secondary | ICD-10-CM | POA: Diagnosis not present

## 2016-04-12 DIAGNOSIS — N183 Chronic kidney disease, stage 3 (moderate): Secondary | ICD-10-CM | POA: Diagnosis not present

## 2016-04-15 DIAGNOSIS — Z6837 Body mass index (BMI) 37.0-37.9, adult: Secondary | ICD-10-CM | POA: Diagnosis not present

## 2016-04-15 DIAGNOSIS — M48062 Spinal stenosis, lumbar region with neurogenic claudication: Secondary | ICD-10-CM | POA: Diagnosis not present

## 2016-04-22 DIAGNOSIS — M4726 Other spondylosis with radiculopathy, lumbar region: Secondary | ICD-10-CM | POA: Diagnosis not present

## 2016-04-22 DIAGNOSIS — M48061 Spinal stenosis, lumbar region without neurogenic claudication: Secondary | ICD-10-CM | POA: Diagnosis not present

## 2016-04-22 DIAGNOSIS — M5416 Radiculopathy, lumbar region: Secondary | ICD-10-CM | POA: Diagnosis not present

## 2016-05-07 ENCOUNTER — Encounter: Payer: Self-pay | Admitting: Adult Health

## 2016-05-07 ENCOUNTER — Ambulatory Visit (INDEPENDENT_AMBULATORY_CARE_PROVIDER_SITE_OTHER): Payer: BLUE CROSS/BLUE SHIELD | Admitting: Adult Health

## 2016-05-07 DIAGNOSIS — G4733 Obstructive sleep apnea (adult) (pediatric): Secondary | ICD-10-CM | POA: Diagnosis not present

## 2016-05-07 NOTE — Progress Notes (Signed)
Subjective:    Patient ID: Richard Nolan., male    DOB: 10/12/1955, 60 y.o.   MRN: [id number]  HPI 60 year old male followed for severe sleep apnea Former patient of Dr. Shelle Ironlance   TEST  NPSG 1999:  37/hr with desat to 80%.  Titration study to cpap 15cm 2001. Currently on auto    05/07/2016 Follow up : OSA  Patient presents for an annual follow-up for severe obstructive sleep apnea. Patient has been on C Pap for many years. He says he is doing well without any significant daytime sleepiness. Patient wears his C Pap on average 6-8 hours each night. Unable to do download today in office DME request has been placed. Denies any chest pain, orthopnea, PND, or increased leg swelling    Past Medical History:  Diagnosis Date  . OSA (obstructive sleep apnea)    CPAP nightly  . Stenosing tenosynovitis of finger of left hand    left index and left middle fingers   Current Outpatient Prescriptions on File Prior to Visit  Medication Sig Dispense Refill  . fluticasone (FLONASE) 50 MCG/ACT nasal spray 1 spray each nostril twice a day (Patient taking differently: Place 1 spray into both nostrils daily. 1 spray each nostril twice a day) 48 g 2  . Omega-3 Fatty Acids (FISH OIL) 1000 MG CAPS Take 1 capsule by mouth daily.       No current facility-administered medications on file prior to visit.       Review of Systems Constitutional:   No  weight loss, night sweats,  Fevers, chills, fatigue, or  lassitude.  HEENT:   No headaches,  Difficulty swallowing,  Tooth/dental problems, or  Sore throat,                No sneezing, itching, ear ache, nasal congestion, post nasal drip,   CV:  No chest pain,  Orthopnea, PND, swelling in lower extremities, anasarca, dizziness, palpitations, syncope.   GI  No heartburn, indigestion, abdominal pain, nausea, vomiting, diarrhea, change in bowel habits, loss of appetite, bloody stools.   Resp: No shortness of breath with exertion or at rest.  No excess  mucus, no productive cough,  No non-productive cough,  No coughing up of blood.  No change in color of mucus.  No wheezing.  No chest wall deformity  Skin: no rash or lesions.  GU: no dysuria, change in color of urine, no urgency or frequency.  No flank pain, no hematuria   MS:  No joint pain or swelling.  No decreased range of motion.  No back pain.  Psych:  No change in mood or affect. No depression or anxiety.  No memory loss.         Objective:   Physical Exam  Vitals:   05/07/16 1433  BP: 140/80  Pulse: 96  Temp: 98.3 F (36.8 C)  TempSrc: Oral  SpO2: 95%  Weight: 259 lb (117.5 kg)  Height: 5\' 10"  (1.778 m)  Body mass index is 37.16 kg/m.   GEN: A/Ox3; pleasant , NAD, obese    HEENT:  [place]/AT,  EACs-clear, TMs-wnl, NOSE-clear, THROAT-clear, no lesions, no postnasal drip or exudate noted. Class 2-3 MP airway   NECK:  Supple w/ fair ROM; no JVD; normal carotid impulses w/o bruits; no thyromegaly or nodules palpated; no lymphadenopathy.    RESP  Clear  P & A; w/o, wheezes/ rales/ or rhonchi. no accessory muscle use, no dullness to percussion  CARD:  RRR, no m/r/g  ,  no peripheral edema, pulses intact, no cyanosis or clubbing.  GI:   Soft & nt; nml bowel sounds; no organomegaly or masses detected.   Musco: Warm bil, no deformities or joint swelling noted.   Neuro: alert, no focal deficits noted.    Skin: Warm, no lesions or rashes  Jenean Escandon NP-C  [facility]  05/07/2016       Assessment & Plan:

## 2016-05-07 NOTE — Assessment & Plan Note (Signed)
Well controlled on C Pap.  Plan Continue on CPAP At bedtime   Work on weight loss  Do not drive if sleepy.  Follow up Dr. Clayborn BignesseDios in 1 year and As needed

## 2016-05-07 NOTE — Patient Instructions (Signed)
Continue on CPAP At bedtime   Work on weight loss  Do not drive if sleepy.  Follow up Dr. Clayborn BignesseDios in 1 year and As needed

## 2016-05-07 NOTE — Addendum Note (Signed)
Addended by: Abigail MiyamotoPHELPS, Aleigh Grunden D on: 05/07/2016 03:43 PM   Modules accepted: Orders

## 2016-05-09 ENCOUNTER — Ambulatory Visit: Payer: BLUE CROSS/BLUE SHIELD | Admitting: Pulmonary Disease

## 2016-05-30 DIAGNOSIS — G4733 Obstructive sleep apnea (adult) (pediatric): Secondary | ICD-10-CM | POA: Diagnosis not present

## 2016-06-05 ENCOUNTER — Telehealth: Payer: Self-pay | Admitting: Pulmonary Disease

## 2016-06-05 NOTE — Telephone Encounter (Signed)
   Will write a note stating pt has been compliant.

## 2016-06-05 NOTE — Telephone Encounter (Signed)
Letter done. AD signed. And it was faxed. Nothing further is needed at this time.

## 2016-06-05 NOTE — Telephone Encounter (Signed)
   Patient is a  60 year old male followed for severe sleep apnea He had a sleep study in  1999 which showed severe sleep apnea with AHI of 37/hr with desat to 80%.  He had a titration study to cpap 15cm 2001. He is currently on CPAP 10 cm water.  Last time he was seen at the office was 05/07/2016. He was seen by Rubye Oaksammy Parrett NP.  He stated he was doing well. Feels better using CPAP. Feels benefit of CPAP. His current SD card then was not working so he had a new ST card.  Download the last month from 04/30/2016 until 05/29/2016: 100% compliance, AHI 0.6.  Cont cpap 10 cm water.    Pollie MeyerJ. Angelo A de Dios, MD 06/05/2016, 5:04 PM [facility] Pager [phone] After 3 pm or if no answer, call [phone]

## 2016-06-05 NOTE — Telephone Encounter (Signed)
Pt states he came in on 05/07/16 with TP for OSA follow up-was told to see AD in 1 year. Pt's SD card was no good for DL at that visit -so he went to his DME and got a new card. 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 Pt needs AD to send letter to Dr Pernell DupreAdams stating he is compliant with his CPAP machine (based on TP's visit) today. TP is out of the office this afternoon.    AD please advise.Thanks.

## 2016-07-04 ENCOUNTER — Telehealth: Payer: Self-pay | Admitting: Adult Health

## 2016-07-04 NOTE — Telephone Encounter (Signed)
Disability forms given to TP.    [employer]  to [employer] E Nolan       07/04/16 1:09 PM  Rec'd disability forms from [employer] - fwd to GareyJasmine to give to TP to complete - pr

## 2016-07-05 NOTE — Telephone Encounter (Signed)
Before I left with TP had not done it yet, Richard Nolan is with her today, Will forward to her to be on the look out for it

## 2016-07-05 NOTE — Telephone Encounter (Signed)
Jasmine, do you know if these forms were completed?

## 2016-07-08 NOTE — Telephone Encounter (Signed)
Rec'd completed disability forms back - fwd to [employer] via interoffice mail - 07/08/16-pr

## 2016-07-08 NOTE — Telephone Encounter (Signed)
Completed and signed.

## 2016-07-08 NOTE — Telephone Encounter (Signed)
Forms completed by TP given to [facility] per office protocol to route back to [employer].  Nothing further needed.

## 2016-07-10 DIAGNOSIS — Z6838 Body mass index (BMI) 38.0-38.9, adult: Secondary | ICD-10-CM | POA: Diagnosis not present

## 2016-07-10 DIAGNOSIS — R05 Cough: Secondary | ICD-10-CM | POA: Diagnosis not present

## 2016-07-10 DIAGNOSIS — M545 Low back pain: Secondary | ICD-10-CM | POA: Diagnosis not present

## 2016-11-19 ENCOUNTER — Ambulatory Visit (INDEPENDENT_AMBULATORY_CARE_PROVIDER_SITE_OTHER): Payer: BLUE CROSS/BLUE SHIELD

## 2016-11-19 ENCOUNTER — Encounter: Payer: Self-pay | Admitting: Orthopedic Surgery

## 2016-11-19 ENCOUNTER — Ambulatory Visit (INDEPENDENT_AMBULATORY_CARE_PROVIDER_SITE_OTHER): Payer: BLUE CROSS/BLUE SHIELD | Admitting: Orthopedic Surgery

## 2016-11-19 VITALS — BP 122/77 | HR 79 | Ht 70.0 in | Wt 255.0 lb

## 2016-11-19 DIAGNOSIS — M25572 Pain in left ankle and joints of left foot: Secondary | ICD-10-CM

## 2016-11-19 MED ORDER — PREDNISONE 10 MG PO TABS: 10.0000 mg | ORAL_TABLET | Freq: Three times a day (TID) | ORAL | 0 refills | Status: DC

## 2016-11-19 NOTE — Progress Notes (Signed)
NEW PATIENT OFFICE VISIT    Chief Complaint  Patient presents with  . 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 He placed himself in an Aircast in a postop shoe took some Vicodin so he could sleep and presents with painful weightbearing and tenderness and swelling or decreased range of motion in his left ankle. He has a history of renal insufficiency. Including any nsaid use    Review of Systems  Constitutional: Negative for chills and fever.  Musculoskeletal: Positive for joint pain.  Skin: Negative for rash.  Neurological: Negative for tingling.    Past Medical History:  Diagnosis Date  . OSA (obstructive sleep apnea)    CPAP nightly  . Stenosing tenosynovitis of finger of left hand    left index and left middle fingers    Past Surgical History:  Procedure Laterality Date  . COLONOSCOPY N/A 06/27/2015   Procedure: COLONOSCOPY;  Surgeon: Malissa Hippo, MD;  [facility];  Service: Endoscopy;  Laterality: N/A;  1030  . HERNIA REPAIR    . TRIGGER FINGER RELEASE Left 05/09/2015   Procedure: RELEASE TRIGGER FINGER/A-1 PULLEY, left index finger and left middle finger;  Surgeon: Cindee Salt, MD;  Location: [facility];  Service: Orthopedics;  Laterality: Left;  . UMBILICAL HERNIA REPAIR      Family History  Problem Relation Age of Onset  . Heart disease Father    Social History  Substance Use Topics  . Smoking status: Never Smoker  . Smokeless tobacco: Never Used  . Alcohol use No    BP 122/77   Pulse 79   Ht 5\' 10"  (1.778 m)   Wt 255 lb (115.7 kg)   BMI 36.59 kg/m   Physical Exam  Constitutional: He is oriented to person, place, and time. He appears well-developed and well-nourished. No distress.  HENT:  Head: Normocephalic and atraumatic.  Eyes: Right  eye exhibits no discharge. Left eye exhibits no discharge. No scleral icterus.  Neck: No JVD present. No tracheal deviation present. No thyromegaly present.  Cardiovascular: Normal rate and intact distal pulses.   Pulmonary/Chest: No respiratory distress. He has no wheezes. He has no rales. He exhibits no tenderness.  Abdominal: He exhibits no distension and no mass. No hernia.  Lymphadenopathy:    He has no cervical adenopathy.  Neurological: He is alert and oriented to person, place, and time. No sensory deficit. He exhibits normal muscle tone. Coordination normal.  Skin: Skin is warm and dry. Capillary refill takes less than 2 seconds. No rash noted. He is not diaphoretic. No erythema. No pallor.  Psychiatric: He has a normal mood and affect. His behavior is normal. Judgment and thought content normal.    Ortho Exam  He is walking with a limp  He has global tenderness around the ankle joint including the medial and lateral gutters medial under the malleolus. Painful range of motion swelling without erythema no instability as noted neurovascular exam is intact there is no skin rash  The opposite leg is normal in terms of stability strength range of motion and alignment    Meds ordered this encounter  Medications  . HYDROcodone-acetaminophen (NORCO/VICODIN) 5-325 MG tablet    Sig: Take by mouth.  Marland Kitchen acetaminophen (TYLENOL) 500 MG tablet    Sig: Take 500 mg  by mouth every 6 (six) hours as needed.    Encounter Diagnosis  Name Primary?  . Pain of joint of left ankle and foot Yes     PLAN:   X-rays were taken of his left ankle, no specific acute findings  Recommend immobilization and prednisone until symptoms resolve  Arrange four-week follow-up

## 2016-12-17 ENCOUNTER — Encounter: Payer: Self-pay | Admitting: Orthopedic Surgery

## 2016-12-17 ENCOUNTER — Ambulatory Visit (INDEPENDENT_AMBULATORY_CARE_PROVIDER_SITE_OTHER): Payer: BLUE CROSS/BLUE SHIELD | Admitting: Orthopedic Surgery

## 2016-12-17 VITALS — Wt 243.0 lb

## 2016-12-17 DIAGNOSIS — M25572 Pain in left ankle and joints of left foot: Secondary | ICD-10-CM | POA: Diagnosis not present

## 2016-12-17 NOTE — Progress Notes (Signed)
FOLLOW UP VISIT    61 year old male treated for left ankle swelling with prednisone and immobilization bracing. He has no complaints no symptoms    ROS Physical Exam  Musculoskeletal:       Left ankle: He exhibits normal range of motion, no swelling, no ecchymosis, no deformity, no laceration and normal pulse. No lateral malleolus, no medial malleolus, no AITFL, no CF ligament, no posterior TFL and no head of 5th metatarsal tenderness found. Achilles tendon exhibits no pain and no defect.    Encounter Diagnosis  Name Primary?  . Pain of joint of left ankle and foot Yes    Return to work Thursday

## 2016-12-17 NOTE — Patient Instructions (Signed)
RETURN TO WORK THIS THURS

## 2017-04-19 DIAGNOSIS — Z125 Encounter for screening for malignant neoplasm of prostate: Secondary | ICD-10-CM | POA: Diagnosis not present

## 2017-04-19 DIAGNOSIS — Z0001 Encounter for general adult medical examination with abnormal findings: Secondary | ICD-10-CM | POA: Diagnosis not present

## 2017-04-25 DIAGNOSIS — G4733 Obstructive sleep apnea (adult) (pediatric): Secondary | ICD-10-CM | POA: Diagnosis not present

## 2017-04-25 DIAGNOSIS — N183 Chronic kidney disease, stage 3 (moderate): Secondary | ICD-10-CM | POA: Diagnosis not present

## 2017-04-25 DIAGNOSIS — Z0001 Encounter for general adult medical examination with abnormal findings: Secondary | ICD-10-CM | POA: Diagnosis not present

## 2017-04-25 DIAGNOSIS — Z6837 Body mass index (BMI) 37.0-37.9, adult: Secondary | ICD-10-CM | POA: Diagnosis not present

## 2017-05-19 ENCOUNTER — Ambulatory Visit: Payer: BLUE CROSS/BLUE SHIELD | Admitting: Adult Health

## 2017-05-19 ENCOUNTER — Encounter: Payer: Self-pay | Admitting: Adult Health

## 2017-05-19 DIAGNOSIS — J309 Allergic rhinitis, unspecified: Secondary | ICD-10-CM | POA: Diagnosis not present

## 2017-05-19 DIAGNOSIS — T485X1A Poisoning by other anti-common-cold drugs, accidental (unintentional), initial encounter: Secondary | ICD-10-CM | POA: Diagnosis not present

## 2017-05-19 DIAGNOSIS — T485X5A Adverse effect of other anti-common-cold drugs, initial encounter: Secondary | ICD-10-CM

## 2017-05-19 DIAGNOSIS — G4733 Obstructive sleep apnea (adult) (pediatric): Secondary | ICD-10-CM

## 2017-05-19 DIAGNOSIS — J310 Chronic rhinitis: Secondary | ICD-10-CM

## 2017-05-19 MED ORDER — FLUTICASONE PROPIONATE 50 MCG/ACT NA SUSP: NASAL | 2 refills | Status: DC

## 2017-05-19 NOTE — Patient Instructions (Signed)
Continue on CPAP At bedtime   Work on weight loss  Do not drive if sleepy.  Continue on Flonase 2 puffs daily As needed   Follow up Dr. Craige CottaSood  in 1 year and As needed

## 2017-05-19 NOTE — Progress Notes (Signed)
 @Patient  ID: Richard Nolan Jr., male    DOB: 01/01/1956, 61 y.o.   MRN: [id number]  Chief Complaint  Patient presents with  . Follow-up    OSA     Referring provider: Carylon PerchesFagan, Roy, MD  HPI: 61 year old male followed for severe sleep apnea Former patient of Dr. 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    TEST  NPSG 1999:  37/hr with desat to 80%.  Titration study to cpap 15cm 2001.  05/19/2017 Follow up ; OSA  Pt returns for annual follow-up for severe sleep apnea.  Patient is on CPAP.  He says he is doing well with no significant daytime sleepiness.  Patient says he wears his CPAP each night.  He feels rested.  He wears his machine at least 6- hours each night. CPAP download requested.    Allergies  Allergen Reactions  . Vioxx [Rofecoxib] Anaphylaxis    Throat swelling    Immunization History  Administered Date(s) Administered  . Influenza Split 05/01/2013, 05/06/2016, 04/18/2017  . Influenza Whole 03/02/2011, 03/31/2012  . Influenza-Unspecified 03/31/2014    Past Medical History:  Diagnosis Date  . OSA (obstructive sleep apnea)    CPAP nightly  . Stenosing tenosynovitis of finger of left hand    left index and left middle fingers    Tobacco History: Social History   Tobacco Use  Smoking Status Never Smoker  Smokeless Tobacco Never Used   Counseling given: Not Answered   Outpatient Encounter Medications as of 05/19/2017  Medication Sig  . fluticasone (FLONASE) 50 MCG/ACT nasal spray 1 spray each nostril twice a day  . acetaminophen (TYLENOL) 500 MG tablet Take 500 mg by mouth every 6 (six) hours as needed.  . cetirizine (ZYRTEC) 10 MG tablet Take 10 mg by mouth daily. Take 1/2 tablet daily  . Omega-3 Fatty Acids (FISH OIL) 1000 MG CAPS Take 1 capsule by mouth daily.     No facility-administered encounter medications on file as of 05/19/2017.      Review of Systems  Constitutional:   No  weight loss, night  sweats,  Fevers, chills, fatigue, or  lassitude.  HEENT:   No headaches,  Difficulty swallowing,  Tooth/dental problems, or  Sore throat,                No sneezing, itching, ear ache, nasal congestion, post nasal drip,   CV:  No chest pain,  Orthopnea, PND, swelling in lower extremities, anasarca, dizziness, palpitations, syncope.   GI  No heartburn, indigestion, abdominal pain, nausea, vomiting, diarrhea, change in bowel habits, loss of appetite, bloody stools.   Resp: No shortness of breath with exertion or at rest.  No excess mucus, no productive cough,  No non-productive cough,  No coughing up of blood.  No change in color of mucus.  No wheezing.  No chest wall deformity  Skin: no rash or lesions.  GU: no dysuria, change in color of urine, no urgency or frequency.  No flank pain, no hematuria   MS:  No joint pain or swelling.  No decreased range of motion.  No back pain.    Physical Exam  BP 136/80 (BP Location: Left Arm, Cuff Size: Normal)   Pulse 77   Ht 5' 9.5" (1.765 m)   Wt 257 lb (116.6 kg)   SpO2 97%   BMI 37.41 kg/m   GEN: A/Ox3; pleasant , NAD, obese    HEENT:  [place]/AT,  EACs-clear, TMs-wnl, NOSE-clear, THROAT-clear,  no lesions, no postnasal drip or exudate noted. Class 2 MP airway   NECK:  Supple w/ fair ROM; no JVD; normal carotid impulses w/o bruits; no thyromegaly or nodules palpated; no lymphadenopathy.    RESP  Clear  P & A; w/o, wheezes/ rales/ or rhonchi. no accessory muscle use, no dullness to percussion  CARD:  RRR, no m/r/g, no peripheral edema, pulses intact, no cyanosis or clubbing.  GI:   Soft & nt; nml bowel sounds; no organomegaly or masses detected.   Musco: Warm bil, no deformities or joint swelling noted.   Neuro: alert, no focal deficits noted.    Skin: Warm, no lesions or rashes    Lab Results:  CBC No results found for: WBC, RBC, HGB, HCT, PLT, MCV, MCH, MCHC, RDW, LYMPHSABS, MONOABS, EOSABS, BASOSABS  BMET No results found for:  NA, K, CL, CO2, GLUCOSE, BUN, CREATININE, CALCIUM, GFRNONAA, GFRAA  BNP No results found for: BNP  ProBNP No results found for: PROBNP  Imaging: No results found.   Assessment & Plan:   OSA (obstructive sleep apnea) Controlled on CPAP   Plan  Patient Instructions  Continue on CPAP At bedtime   Work on weight loss  Do not drive if sleepy.  Continue on Flonase 2 puffs daily As needed   Follow up Dr. Craige CottaSood  in 1 year and As needed       Allergic rhinitis Controlled on Flonase  Cont on [employer] Flonase      Mikaiah Stoffer, NP 05/19/2017

## 2017-05-19 NOTE — Assessment & Plan Note (Signed)
Controlled on Flonase  Cont on Flonase

## 2017-05-19 NOTE — Assessment & Plan Note (Signed)
Controlled on CPAP   Plan  Patient Instructions  Continue on CPAP At bedtime   Work on weight loss  Do not drive if sleepy.  Continue on Flonase 2 puffs daily As needed   Follow up Dr. Craige CottaSood  in 1 year and As needed

## 2017-05-19 NOTE — Addendum Note (Signed)
Addended by: Boone MasterJONES, Edward Guthmiller E on: 05/19/2017 05:33 PM   Modules accepted: Orders

## 2017-05-20 NOTE — Progress Notes (Signed)
I have reviewed and agree with assessment/plan.  Bayley Hurn, MD [facility] 05/20/2017, 12:26 PM Pager:  [phone]  

## 2017-07-12 DIAGNOSIS — J069 Acute upper respiratory infection, unspecified: Secondary | ICD-10-CM | POA: Diagnosis not present

## 2017-07-23 ENCOUNTER — Ambulatory Visit: Payer: Self-pay | Admitting: *Deleted

## 2017-07-23 ENCOUNTER — Other Ambulatory Visit: Payer: Self-pay

## 2017-07-23 ENCOUNTER — Encounter: Payer: Self-pay | Admitting: Physician Assistant

## 2017-07-23 ENCOUNTER — Ambulatory Visit: Payer: BLUE CROSS/BLUE SHIELD | Admitting: Physician Assistant

## 2017-07-23 VITALS — BP 116/72 | HR 97 | Temp 100.0°F | Resp 16 | Ht 69.29 in | Wt 255.0 lb

## 2017-07-23 DIAGNOSIS — J111 Influenza due to unidentified influenza virus with other respiratory manifestations: Secondary | ICD-10-CM | POA: Diagnosis not present

## 2017-07-23 DIAGNOSIS — J22 Unspecified acute lower respiratory infection: Secondary | ICD-10-CM | POA: Diagnosis not present

## 2017-07-23 DIAGNOSIS — R6889 Other general symptoms and signs: Secondary | ICD-10-CM | POA: Diagnosis not present

## 2017-07-23 LAB — POC INFLUENZA A&B (BINAX/QUICKVUE)
Influenza A, POC: POSITIVE — AB
Influenza B, POC: NEGATIVE

## 2017-07-23 MED ORDER — AZITHROMYCIN 250 MG PO TABS: ORAL_TABLET | ORAL | 0 refills | Status: DC

## 2017-07-23 MED ORDER — OSELTAMIVIR PHOSPHATE 75 MG PO CAPS: 75.0000 mg | ORAL_CAPSULE | Freq: Two times a day (BID) | ORAL | 0 refills | Status: DC

## 2017-07-23 MED ORDER — HYDROCOD POLST-CPM POLST ER 10-8 MG/5ML PO SUER: 5.0000 mL | Freq: Every evening | ORAL | 0 refills | Status: DC | PRN

## 2017-07-23 MED ORDER — GUAIFENESIN ER 1200 MG PO TB12: 1.0000 | ORAL_TABLET | Freq: Two times a day (BID) | ORAL | 1 refills | Status: DC | PRN

## 2017-07-23 NOTE — Progress Notes (Signed)
PRIMARY CARE AT [facility] [address] [place] [address] [phone]  Date:  07/23/2017   Name:  Richard Nolan.   DOB:  05-13-1956   MRN:  [id number]  PCP:  Carylon Perches, MD    History of Present Illness:  Richard Nolan. is a 62 y.o. male patient who presents to PCP with  Chief Complaint  Patient presents with  . Cough    pt states he has been having this chronic cough x 3 weeks   . Fatigue     3 weeks, post nasal drip.  Sore throat.   Bodyaches, but it is not resolving.  Yesterday, he felt lightheaded.  Clear sputum and productive.  No sob or dyspnea.  No facial pain.  No ear discomfort.  Sneezing.  He is not taking his allergy medication at this time.   He has had flu around the family.  This current symptoms of the bodyaches, chills initiated 3 days ago.  He is a non-smoker.   He took dimetapp, robitussin, and then took vicks nighttime.     Patient Active Problem List   Diagnosis Date Noted  . Allergic rhinitis 05/19/2017  . Other disorders of synovium, tendon, and bursa(727.89) 12/14/2012  . OSA (obstructive sleep apnea) 10/22/2010    Past Medical History:  Diagnosis Date  . OSA (obstructive sleep apnea)    CPAP nightly  . Stenosing tenosynovitis of finger of left hand    left index and left middle fingers    Past Surgical History:  Procedure Laterality Date  . COLONOSCOPY N/A 06/27/2015   Procedure: COLONOSCOPY;  Surgeon: Malissa Hippo, MD;  [facility];  Service: Endoscopy;  Laterality: N/A;  1030  . HERNIA REPAIR    . TRIGGER FINGER RELEASE Left 05/09/2015   Procedure: RELEASE TRIGGER FINGER/A-1 PULLEY, left index finger and left middle finger;  Surgeon: Cindee Salt, MD;  Location: [facility];  Service: Orthopedics;  Laterality: Left;  . UMBILICAL HERNIA REPAIR      Social History   Tobacco Use  . Smoking status: Never Smoker  . Smokeless tobacco: Never Used  Substance Use Topics  . Alcohol use: No    Alcohol/week: 0.0  oz  . Drug use: No    Family History  Problem Relation Age of Onset  . Heart disease Father     Allergies  Allergen Reactions  . Vioxx [Rofecoxib] Anaphylaxis    Throat swelling    Medication list has been reviewed and updated.  Current Outpatient Medications on File Prior to Visit  Medication Sig Dispense Refill  . acetaminophen (TYLENOL) 500 MG tablet Take 500 mg by mouth every 6 (six) hours as needed.    . cetirizine (ZYRTEC) 10 MG tablet Take 10 mg by mouth daily. Take 1/2 tablet daily    . fluticasone (FLONASE) 50 MCG/ACT nasal spray 1 spray each nostril twice a day 48 g 2  . Omega-3 Fatty Acids (FISH OIL) 1000 MG CAPS Take 1 capsule by mouth daily.       No current facility-administered medications on file prior to visit.     ROS ROS otherwise unremarkable unless listed above.  Physical Examination: BP 116/72   Pulse 97   Temp 100 F (37.8 C) (Oral)   Resp 16   Ht 5' 9.29" (1.76 m)   Wt 255 lb (115.7 kg)   SpO2 96%   BMI 37.34 kg/m  Ideal Body Weight: Weight in (lb) to have BMI =  25: 170.4  Physical Exam  Constitutional: He is oriented to person, place, and time. He appears well-developed and well-nourished. No distress.  HENT:  Head: Normocephalic and atraumatic.  Eyes: Conjunctivae and EOM are normal. Pupils are equal, round, and reactive to light.  Cardiovascular: Normal rate, regular rhythm, normal heart sounds and intact distal pulses. Exam reveals no friction rub.  No murmur heard. Pulmonary/Chest: Effort normal and breath sounds normal. No respiratory distress.  Neurological: He is alert and oriented to person, place, and time.  Skin: Skin is warm and dry. He is not diaphoretic.  Psychiatric: He has a normal mood and affect. His behavior is normal.   Results for orders placed or performed in visit on 07/23/17  POC Influenza A&B(BINAX/QUICKVUE)  Result Value Ref Range   Influenza A, POC Positive (A) Negative   Influenza B, POC Negative Negative     Assessment and Plan: Judy Pimplera T Schaaf Jr. is a 62 y.o. male who is here today for cc of  Chief Complaint  Patient presents with  . Cough    pt states he has been having this chronic cough x 3 weeks   . Fatigue  treating influenza at this time.  Influenza - Plan: chlorpheniramine-HYDROcodone (TUSSIONEX PENNKINETIC ER) 10-8 MG/5ML SUER, Guaifenesin (MUCINEX MAXIMUM STRENGTH) 1200 MG TB12, oseltamivir (TAMIFLU) 75 MG capsule  Flu-like symptoms - Plan: POC Influenza A&B(BINAX/QUICKVUE), chlorpheniramine-HYDROcodone (TUSSIONEX PENNKINETIC ER) 10-8 MG/5ML SUER  Lower respiratory infection (e.g., bronchitis, pneumonia, pneumonitis, pulmonitis) - Plan: Guaifenesin (MUCINEX MAXIMUM STRENGTH) 1200 MG TB12  Trena PlattStephanie Lior Cartelli, PA-C [facility] 2/5/201912:00 PM

## 2017-07-23 NOTE — Telephone Encounter (Signed)
Pt called with complaints of cough that began 2 months ago,around 07/21/17 coughing so hard he can't stop; feels like something is stuck in his throat, per nurse triage recommendation pt to see physician within 24 hours; per [facility] [place] offered and accepted 1100 appointment with Trena PlattStephanie English; pt verbalizes understanding.  Reason for Disposition . [1] Continuous (nonstop) coughing interferes with work or school AND [2] no improvement using cough treatment per protocol  Answer Assessment - Initial Assessment Questions 1. ONSET: "When did the cough begin?"      2 months ago 2. SEVERITY: "How bad is the cough today?"      "Mild to moderate but has spasms where he can't stop" 3. RESPIRATORY DISTRESS: "Describe your breathing."      normal 4. FEVER: "Do you have a fever?" If so, ask: "What is your temperature, how was it measured, and when did it start?"     no 5. HEMOPTYSIS: "Are you coughing up any blood?" If so ask: "How much?" (flecks, streaks, tablespoons, etc.)     no 6. TREATMENT: "What have you done so far to treat the cough?" (e.g., meds, fluids, humidifier)     Robitussin, dymatap, vicks, muccinex 7. CARDIAC HISTORY: "Do you have any history of heart disease?" (e.g., heart attack, congestive heart failure)      no 8. LUNG HISTORY: "Do you have any history of lung disease?"  (e.g., pulmonary embolus, asthma, emphysema)     No history sleep apnea 9. 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 OTHER SYMPTOMS: "Do you have any other symptoms? (e.g., runny nose, wheezing, chest pain)       no 11. PREGNANCY: "Is there any chance you are pregnant?" "When was your last menstrual period?"       n/a 12. TRAVEL: "Have you traveled out of the country in the last month?" (e.g., travel history, exposures)       no  Protocols used: COUGH - ACUTE NON-PRODUCTIVE-A-AH

## 2017-07-23 NOTE — Patient Instructions (Addendum)
Please hydrate well with 64 oz of water if not more. Please take medication as prescribed.  Influenza, Adult Influenza ("the flu") is an infection in the lungs, nose, and throat (respiratory tract). It is caused by a virus. The flu causes many common cold symptoms, as well as a high fever and body aches. It can make you feel very sick. The flu spreads easily from person to person (is contagious). Getting a flu shot (influenza vaccination) every year is the best way to prevent the flu. Follow these instructions at home:  Take over-the-counter and prescription medicines only as told by your doctor.  Use a cool mist humidifier to add moisture (humidity) to the air in your home. This can make it easier to breathe.  Rest as needed.  Drink enough fluid to keep your pee (urine) clear or pale yellow.  Cover your mouth and nose when you cough or sneeze.  Wash your hands with soap and water often, especially after you cough or sneeze. If you cannot use soap and water, use hand sanitizer.  Stay home from work or school as told by your doctor. Unless you are visiting your doctor, try to avoid leaving home until your fever has been gone for 24 hours without the use of medicine.  Keep all follow-up visits as told by your doctor. This is important. How is this prevented?  Getting a yearly (annual) flu shot is the best way to avoid getting the flu. You may get the flu shot in late summer, fall, or winter. Ask your doctor when you should get your flu shot.  Wash your hands often or use hand sanitizer often.  Avoid contact with people who are sick during cold and flu season.  Eat healthy foods.  Drink plenty of fluids.  Get enough sleep.  Exercise regularly. Contact a doctor if:  You get new symptoms.  You have: ? Chest pain. ? Watery poop (diarrhea). ? A fever.  Your cough gets worse.  You start to have more mucus.  You feel sick to your stomach (nauseous).  You throw up  (vomit). Get help right away if:  You start to be short of breath or have trouble breathing.  Your skin or nails turn a bluish color.  You have very bad pain or stiffness in your neck.  You get a sudden headache.  You get sudden pain in your face or ear.  You cannot stop throwing up. This information is not intended to replace advice given to you by your health care provider. Make sure you discuss any questions you have with your health care provider. Document Released: 03/26/2008 Document Revised: 11/23/2015 Document Reviewed: 04/11/2015 Elsevier Interactive Patient Education  2017 [employer]    IF you received an x-ray today, you will receive an invoice from [facility]. Please contact [facility] at [phone] with questions or concerns regarding your invoice.   IF you received labwork today, you will receive an invoice from [facility]. Please contact [facility] at [phone] with questions or concerns regarding your invoice.   Our billing staff will not be able to assist you with questions regarding bills from these companies.  You will be contacted with the lab results as soon as they are available. The fastest way to get your results is to activate your My Chart account. Instructions are located on the last page of this paperwork. If you have not heard from us regarding the results in 2 weeks, please contact this office.

## 2017-07-28 ENCOUNTER — Telehealth: Payer: Self-pay | Admitting: Adult Health

## 2017-07-28 DIAGNOSIS — G4733 Obstructive sleep apnea (adult) (pediatric): Secondary | ICD-10-CM

## 2017-07-28 NOTE — Telephone Encounter (Signed)
Left pt detailed msg this was done

## 2017-07-28 NOTE — Telephone Encounter (Signed)
Order was sent to [facility] 

## 2017-08-06 DIAGNOSIS — G4733 Obstructive sleep apnea (adult) (pediatric): Secondary | ICD-10-CM | POA: Diagnosis not present

## 2017-10-06 ENCOUNTER — Ambulatory Visit: Payer: BLUE CROSS/BLUE SHIELD | Admitting: Physician Assistant

## 2017-10-06 ENCOUNTER — Encounter: Payer: Self-pay | Admitting: Physician Assistant

## 2017-10-06 ENCOUNTER — Other Ambulatory Visit: Payer: Self-pay

## 2017-10-06 VITALS — BP 106/68 | HR 88 | Temp 98.6°F | Resp 16 | Ht 69.29 in | Wt 250.4 lb

## 2017-10-06 DIAGNOSIS — H6121 Impacted cerumen, right ear: Secondary | ICD-10-CM

## 2017-10-06 DIAGNOSIS — H6122 Impacted cerumen, left ear: Secondary | ICD-10-CM

## 2017-10-06 NOTE — Progress Notes (Signed)
Richard Nolan.  MRN: [id number] DOB: 03/21/1956  Subjective:  Richard Pimplera T Moller Nolan. is a 62 y.o. male seen in office today for a chief complaint of bilateral ears clogged x 1 month.  It has been worse the past week.  Has ear fullness and decreased hearing.  Denies ear pain, dizziness, tiredness, and acute ear injury.  Notes he has not been using Q-tips recently.  He does not use ear buds.  Has tried Debrox drops with no relief. Has had to have them cleaned out here before.   Review of Systems  Constitutional: Negative for chills, diaphoresis and fever.    Patient Active Problem List   Diagnosis Date Noted  . Allergic rhinitis 05/19/2017  . Other disorders of synovium, tendon, and bursa(727.89) 12/14/2012  . OSA (obstructive sleep apnea) 10/22/2010    Current Outpatient Medications on File Prior to Visit  Medication Sig Dispense Refill  . acetaminophen (TYLENOL) 500 MG tablet Take 500 mg by mouth every 6 (six) hours as needed.    . fluticasone (FLONASE) 50 MCG/ACT nasal spray 1 spray each nostril twice a day 48 g 2  . azithromycin (ZITHROMAX) 250 MG tablet Take 2 tabs PO x 1 dose, then 1 tab PO QD x 4 days (Patient not taking: Reported on 10/06/2017) 6 tablet 0  . cetirizine (ZYRTEC) 10 MG tablet Take 10 mg by mouth daily. Take 1/2 tablet daily    . chlorpheniramine-HYDROcodone (TUSSIONEX PENNKINETIC ER) 10-8 MG/5ML SUER Take 5 mLs by mouth at bedtime as needed. (Patient not taking: Reported on 10/06/2017) 115 mL 0  . Guaifenesin (MUCINEX MAXIMUM STRENGTH) 1200 MG TB12 Take 1 tablet (1,200 mg total) by mouth every 12 (twelve) hours as needed. (Patient not taking: Reported on 10/06/2017) 14 tablet 1  . Omega-3 Fatty Acids (FISH OIL) 1000 MG CAPS Take 1 capsule by mouth daily.      Marland Kitchen. oseltamivir (TAMIFLU) 75 MG capsule Take 1 capsule (75 mg total) by mouth 2 (two) times daily. (Patient not taking: Reported on 10/06/2017) 10 capsule 0   No current facility-administered medications on file prior to  visit.     Allergies  Allergen Reactions  . Vioxx [Rofecoxib] Anaphylaxis    Throat swelling     Objective:  BP 106/68   Pulse 88   Temp 98.6 F (37 C)   Resp 16   Ht 5' 9.29" (1.76 m)   Wt 250 lb 6.4 oz (113.6 kg)   SpO2 97%   BMI 36.67 kg/m   Physical Exam  Constitutional: He is oriented to person, place, and time. He appears well-developed and well-nourished.  HENT:  Head: Normocephalic and atraumatic.  Right Ear: External ear normal. No tenderness.  Left Ear: External ear normal. No tenderness.  Bilateral ear canals completely obstructed with copius amounts dark brown dry cerumen.   Eyes: Conjunctivae are normal.  Neck: Normal range of motion.  Pulmonary/Chest: Effort normal.  Neurological: He is alert and oriented to person, place, and time.  Skin: Skin is warm and dry.  Psychiatric: He has a normal mood and affect.  Vitals reviewed.  Ear lavage was successful for right ear and unsuccessful for left ear. After ear lavage of left ear was attempted, I personal used an ear curette to remove occluded cerumen with success. Ear canals are clear.  TMs are visualized and are normal in appearance.  No erythema or bulging of TM noted. Pt reports he can hear much better now.    Assessment and  Plan :  1. Impacted cerumen of left ear Lavage unsuccessful. Resolved with use of ear curette.  - Ear wax removal  2. Impacted cerumen of right ear Resolved with lavage.  - Ear wax removal  Recommended returning in one year for reevaluation.   Benjiman Core PA-C  Primary Care at [facility] 10/06/2017 5:31 PM

## 2017-10-06 NOTE — Patient Instructions (Addendum)
Thank you for letting me participate in your health and well being.  Earwax Buildup, Adult The ears produce a substance called earwax that helps keep bacteria out of the ear and protects the skin in the ear canal. Occasionally, earwax can build up in the ear and cause discomfort or hearing loss. What increases the risk? This condition is more likely to develop in people who:  Are male.  Are elderly.  Naturally produce more earwax.  Clean their ears often with cotton swabs.  Use earplugs often.  Use in-ear headphones often.  Wear hearing aids.  Have narrow ear canals.  Have earwax that is overly thick or sticky.  Have eczema.  Are dehydrated.  Have excess hair in the ear canal.  What are the signs or symptoms? Symptoms of this condition include:  Reduced or muffled hearing.  A feeling of fullness in the ear or feeling that the ear is plugged.  Fluid coming from the ear.  Ear pain.  Ear itch.  Ringing in the ear.  Coughing.  An obvious piece of earwax that can be seen inside the ear canal.  How is this diagnosed? This condition may be diagnosed based on:  Your symptoms.  Your medical history.  An ear exam. During the exam, your health care provider will look into your ear with an instrument called an otoscope.  You may have tests, including a hearing test. How is this treated? This condition may be treated by:  Using ear drops to soften the earwax.  Having the earwax removed by a health care provider. The health care provider may: ? Flush the ear with water. ? Use an instrument that has a loop on the end (curette). ? Use a suction device.  Surgery to remove the wax buildup. This may be done in severe cases.  Follow these instructions at home:  Take over-the-counter and prescription medicines only as told by your health care provider.  Do not put any objects, including cotton swabs, into your ear. You can clean the opening of your ear canal  with a washcloth or facial tissue.  Follow instructions from your health care provider about cleaning your ears. Do not over-clean your ears.  Drink enough fluid to keep your urine clear or pale yellow. This will help to thin the earwax.  Keep all follow-up visits as told by your health care provider. If earwax builds up in your ears often or if you use hearing aids, consider seeing your health care provider for routine, preventive ear cleanings. Ask your health care provider how often you should schedule your cleanings.  If you have hearing aids, clean them according to instructions from the manufacturer and your health care provider. Contact a health care provider if:  You have ear pain.  You develop a fever.  You have blood, pus, or other fluid coming from your ear.  You have hearing loss.  You have ringing in your ears that does not go away.  Your symptoms do not improve with treatment.  You feel like the room is spinning (vertigo). Summary  Earwax can build up in the ear and cause discomfort or hearing loss.  The most common symptoms of this condition include reduced or muffled hearing and a feeling of fullness in the ear or feeling that the ear is plugged.  This condition may be diagnosed based on your symptoms, your medical history, and an ear exam.  This condition may be treated by using ear drops to soften the earwax  earwax or by having the earwax removed by a health care provider.  Do not put any objects, including cotton swabs, into your ear. You can clean the opening of your ear canal with a washcloth or facial tissue. This information is not intended to replace advice given to you by your health care provider. Make sure you discuss any questions you have with your health care provider. Document Released: 07/25/2004 Document Revised: 08/28/2016 Document Reviewed: 08/28/2016 Elsevier Interactive Patient Education  2018 Elsevier Inc.   IF you received an x-ray today, you  will receive an invoice from [facility]. Please contact [facility] at [phone] with questions or concerns regarding your invoice.   IF you received labwork today, you will receive an invoice from [facility]. Please contact [facility] at [phone] with questions or concerns regarding your invoice.   Our billing staff will not be able to assist you with questions regarding bills from these companies.  You will be contacted with the lab results as soon as they are available. The fastest way to get your results is to activate your My Chart account. Instructions are located on the last page of this paperwork. If you have not heard from us regarding the results in 2 weeks, please contact this office.      

## 2017-10-08 ENCOUNTER — Encounter: Payer: Self-pay | Admitting: Physician Assistant

## 2017-10-10 ENCOUNTER — Encounter: Payer: Self-pay | Admitting: Physician Assistant

## 2017-11-21 ENCOUNTER — Telehealth: Payer: Self-pay | Admitting: Adult Health

## 2017-11-21 NOTE — Telephone Encounter (Signed)
Spoke with the pt  He states needing [place] to do a letter for him and fax to the number listed in the below msg stating that he has been compliant with CPAP therapy Pt was seen in the past by Dr Shelle Iron, but was seen by TP last 05/19/17, and he was told to f/u with Dr Craige Cotta in a year  I received his DL and have given to JJ to discuss with TP  Please advise, thanks

## 2017-11-21 NOTE — Telephone Encounter (Signed)
patient left a copy of his CPAP compliance report; states once reviewed a letter for [employer] with Dr. Pernell Dupre stating he is in compliance; pt states letter should be faxed to: [phone]; pt contact # [phone] for further explanation; compliance report left in TP folder at the checkout area.

## 2017-11-27 NOTE — Telephone Encounter (Signed)
Tammy please advise. Thanks. 

## 2017-11-28 ENCOUNTER — Encounter: Payer: Self-pay | Admitting: *Deleted

## 2017-11-28 NOTE — Telephone Encounter (Signed)
Letter done and faxed  Spoke with the pt and notified that this was done   Called and spoke with RT at [facility] in [place]  He states that he is showing a CPAP pressure of auto 5-16  I advised that we last prescribed auto 5-20 according to last order in 2015  He states will research why and whom made the change and call us back

## 2017-11-28 NOTE — Telephone Encounter (Signed)
Brian/Layne's calling back, CB is [phone] ext [phone]

## 2017-11-28 NOTE — Telephone Encounter (Signed)
Per TP: okay for letter stating that patient is very compliant.  But his control is not as good (AHI has increased).  Triage, if we could please call pt's DME to find out when/by whom pt's CPAP pressure was changed (last noted AHI was 0.6 with a set pressure of 10cm).  There is no documentation in our system of an order for pressure change.  Thank you.

## 2017-11-28 NOTE — Telephone Encounter (Signed)
Returned call from [employer] .  He stated that last order received was 5-20 in 2015.  The settings were changed in 01/2014 to 5-16, but it did not show who changed it. He did state that could be possible for Patient to  possible change it. Arlys John reset CPAP to 5-20 per original order. Nothing further needed at this time.

## 2018-04-13 DIAGNOSIS — G4733 Obstructive sleep apnea (adult) (pediatric): Secondary | ICD-10-CM | POA: Diagnosis not present

## 2018-05-09 ENCOUNTER — Encounter: Payer: Self-pay | Admitting: Family Medicine

## 2018-05-09 ENCOUNTER — Ambulatory Visit: Payer: BLUE CROSS/BLUE SHIELD | Admitting: Family Medicine

## 2018-05-09 ENCOUNTER — Other Ambulatory Visit: Payer: Self-pay

## 2018-05-09 VITALS — BP 133/75 | HR 79 | Temp 98.0°F | Resp 17 | Ht 69.29 in | Wt 254.2 lb

## 2018-05-09 DIAGNOSIS — B349 Viral infection, unspecified: Secondary | ICD-10-CM | POA: Diagnosis not present

## 2018-05-09 DIAGNOSIS — R198 Other specified symptoms and signs involving the digestive system and abdomen: Secondary | ICD-10-CM | POA: Diagnosis not present

## 2018-05-09 DIAGNOSIS — R7303 Prediabetes: Secondary | ICD-10-CM | POA: Diagnosis not present

## 2018-05-09 DIAGNOSIS — Z125 Encounter for screening for malignant neoplasm of prostate: Secondary | ICD-10-CM | POA: Diagnosis not present

## 2018-05-09 DIAGNOSIS — K219 Gastro-esophageal reflux disease without esophagitis: Secondary | ICD-10-CM | POA: Diagnosis not present

## 2018-05-09 DIAGNOSIS — N183 Chronic kidney disease, stage 3 (moderate): Secondary | ICD-10-CM | POA: Diagnosis not present

## 2018-05-09 MED ORDER — DIPHENOXYLATE-ATROPINE 2.5-0.025 MG PO TABS: 1.0000 | ORAL_TABLET | Freq: Four times a day (QID) | ORAL | 0 refills | Status: DC | PRN

## 2018-05-09 MED ORDER — ONDANSETRON 4 MG PO TBDP: 4.0000 mg | ORAL_TABLET | Freq: Three times a day (TID) | ORAL | 0 refills | Status: DC | PRN

## 2018-05-09 NOTE — Patient Instructions (Signed)
° ° ° °  If you have lab work done today you will be contacted with your lab results within the next 2 weeks.  If you have not heard from us then please contact us. The fastest way to get your results is to register for My Chart. ° ° °IF you received an x-ray today, you will receive an invoice from [facility]. Please contact [facility] at [phone] with questions or concerns regarding your invoice.  ° °IF you received labwork today, you will receive an invoice from [facility]. Please contact [facility] at [phone] with questions or concerns regarding your invoice.  ° °Our billing staff will not be able to assist you with questions regarding bills from these companies. ° °You will be contacted with the lab results as soon as they are available. The fastest way to get your results is to activate your My Chart account. Instructions are located on the last page of this paperwork. If you have not heard from us regarding the results in 2 weeks, please contact this office. °  ° ° ° °

## 2018-05-09 NOTE — Progress Notes (Signed)
Patient ID: Richard Nolan., male    DOB: 1955-08-02, 62 y.o.   MRN: [id number]  PCP: Carylon Perches, MD  Chief Complaint  Patient presents with  . ? stomach flu    onset: monday 05/04/18 emesis at 7:30-8:00 am, emesis 5 more times after that ok, started to feeling better.  Yesterday morning 4 am  nausea back, chills, diarrhea, aching and fever.  Pt doesn't know what temps were -no temps taken with thermometer, thinks temps were low grade.  Per pt last night fever broke.  Per pt flu vaccine 1 1/2 months ago.    Subjective:  HPI Penn Grissett. is a 62 y.o. male presents for evaluation of week of intermittent GI symptoms. He reports experiencing nausea and vomiting with abdominal cramping on Monday. He experienced multiple episodes of nausea on with vomiting throughout the day on Monday.  Symptoms resolved. Yesterday, he experienced body aches and nausea with one episode of loose stool. He also has had non-productive cough and sore throat which he treated with tylenol with minimal to know relief. Denies shortness of breath, dizziness, wheezing, or chest pain/tightness. Social History   Socioeconomic History  . Marital status: Married    Spouse name: Not on file  . Number of children: Y  . Years of education: Not on file  . Highest education level: Not on file  Occupational History  . Occupation: truck Public librarian Needs  . Financial resource strain: Not on file  . Food insecurity:    Worry: Not on file    Inability: Not on file  . Transportation needs:    Medical: Not on file    Non-medical: Not on file  Tobacco Use  . Smoking status: Never Smoker  . Smokeless tobacco: Never Used  Substance and Sexual Activity  . Alcohol use: No    Alcohol/week: 0.0 standard drinks  . Drug use: No  . Sexual activity: Not on file  Lifestyle  . Physical activity:    Days per week: Not on file    Minutes per session: Not on file  . Stress: Not on file  Relationships  . Social connections:   Talks on phone: Not on file    Gets together: Not on file    Attends religious service: Not on file    Active member of club or organization: Not on file    Attends meetings of clubs or organizations: Not on file    Relationship status: Not on file  . Intimate partner violence:    Fear of current or ex partner: Not on file    Emotionally abused: Not on file    Physically abused: Not on file    Forced sexual activity: Not on file  Other Topics Concern  . Not on file  Social History Narrative  . Not on file    Family History  Problem Relation Age of Onset  . Heart disease Father    Review of Systems Pertinent negatives listed in HPI Patient Active Problem List   Diagnosis Date Noted  . Allergic rhinitis 05/19/2017  . Other disorders of synovium, tendon, and bursa(727.89) 12/14/2012  . OSA (obstructive sleep apnea) 10/22/2010    Allergies  Allergen Reactions  . Vioxx [Rofecoxib] Anaphylaxis    Throat swelling    Prior to Admission medications   Medication Sig Start Date End Date Taking? Authorizing Provider  acetaminophen (TYLENOL) 500 MG tablet Take 500 mg by mouth every 6 (six) hours as needed.  Yes [provider]  fluticasone (FLONASE) 50 MCG/ACT nasal spray 1 spray each nostril twice a day 05/19/17  Yes Parrett, Virgel Bouquet, NP    Past Medical, Surgical Family and Social History reviewed and updated.    Objective:   Today's Vitals   05/09/18 1230  BP: 133/75  Pulse: 79  Resp: 17  Temp: 98 F (36.7 C)  TempSrc: Oral  SpO2: 96%  Weight: 254 lb 3.2 oz (115.3 kg)  Height: 5' 9.29" (1.76 m)    Wt Readings from Last 3 Encounters:  05/09/18 254 lb 3.2 oz (115.3 kg)  10/06/17 250 lb 6.4 oz (113.6 kg)  07/23/17 255 lb (115.7 kg)    Physical Exam General appearance: alert, well developed, well nourished, cooperative and in no distress Head: Normocephalic, without obvious abnormality, atraumatic Respiratory: Respirations even and unlabored, normal  respiratory rate Heart: rate and rhythm normal. No gallop or murmurs noted on exam  Extremities: No gross deformities Skin: Skin color, texture, turgor normal. No rashes seen  Psych: Appropriate mood and affect. Neurologic: Mental status: Alert, oriented to person, place, and time, thought content appropriate.     Assessment & Plan:  1. Gastrointestinal complaints -nausea has resolved.  Will provide a prescription for Zofran 4 mg every 8 hours as needed. -last episode of diarrhea >12 hours. Will prescribe Lomotil as needed for loose stools.   2. Viral syndrome -recommended rest and hydration. Symptoms will likely resolve with any additional treatment.  Meds ordered this encounter  Medications  . ondansetron (ZOFRAN ODT) 4 MG disintegrating tablet    Sig: Take 1 tablet (4 mg total) by mouth every 8 (eight) hours as needed for nausea or vomiting.    Dispense:  20 tablet    Refill:  0  . diphenoxylate-atropine (LOMOTIL) 2.5-0.025 MG tablet    Sig: Take 1 tablet by mouth 4 (four) times daily as needed for diarrhea or loose stools.    Dispense:  30 tablet    Refill:  0       -The patient was given clear instructions to go to ER or return to medical center if symptoms do not improve, worsen or new problems develop. The patient verbalized understanding.     Godfrey Pick. Tiburcio Pea, FNP-C Nurse Practitioner (PRN Staff)  Primary Care at [facility] [address] [place], [place]  [phone]

## 2018-05-15 DIAGNOSIS — G4733 Obstructive sleep apnea (adult) (pediatric): Secondary | ICD-10-CM | POA: Diagnosis not present

## 2018-05-15 DIAGNOSIS — N183 Chronic kidney disease, stage 3 (moderate): Secondary | ICD-10-CM | POA: Diagnosis not present

## 2018-05-15 DIAGNOSIS — Z0001 Encounter for general adult medical examination with abnormal findings: Secondary | ICD-10-CM | POA: Diagnosis not present

## 2018-05-15 DIAGNOSIS — Z6837 Body mass index (BMI) 37.0-37.9, adult: Secondary | ICD-10-CM | POA: Diagnosis not present

## 2018-05-22 ENCOUNTER — Ambulatory Visit: Payer: BLUE CROSS/BLUE SHIELD | Admitting: Pulmonary Disease

## 2018-05-22 ENCOUNTER — Encounter: Payer: Self-pay | Admitting: Pulmonary Disease

## 2018-05-22 VITALS — BP 124/76 | HR 78 | Ht 69.0 in | Wt 254.6 lb

## 2018-05-22 DIAGNOSIS — G4733 Obstructive sleep apnea (adult) (pediatric): Secondary | ICD-10-CM

## 2018-05-22 DIAGNOSIS — E669 Obesity, unspecified: Secondary | ICD-10-CM

## 2018-05-22 DIAGNOSIS — Z9989 Dependence on other enabling machines and devices: Secondary | ICD-10-CM | POA: Diagnosis not present

## 2018-05-22 DIAGNOSIS — G4730 Sleep apnea, unspecified: Secondary | ICD-10-CM

## 2018-05-22 NOTE — Patient Instructions (Signed)
Will arrange for new CPAP machine  Follow up in 2 months 

## 2018-05-22 NOTE — Progress Notes (Signed)
[facility]  Chief Complaint  Patient presents with  . Follow-up    pt wearing cpap avg 6hr nightly- feels pressure could be stronger. ZOX:[id number]:layne's    Constitutional:  BP 124/76 (BP Location: Left Arm, Cuff Size: Normal)   Pulse 78   Ht 5\' 9"  (1.753 m)   Wt 254 lb 9.6 oz (115.5 kg)   SpO2 96%   BMI 37.60 kg/m   Past Medical History:  OSA  Brief Summary:  Richard Pimplera T Forde Jr. is a 62 y.o. male with obstructive sleep apnea.  He was previously followed by Dr. Shelle Ironlance.  He had sleep study in 1999 that showed severe sleep apnea.  He is a Engineer, structuralprofessional driver.  He was last seen by Tammy Parrett in 2018.  He uses CPAP nightly  Uses nasal mask.  No issues with mask fit.  He is not having dry mouth, sore throat, nasal congestion, or aerophagia.  He feels like his machine doesn't give enough pressure at times.  He is using auto CPAP 5 to 20 cm H2O.  It appears his current machine is more than 64 yrs old.   Physical Exam:   Appearance - well kempt   ENMT - clear nasal mucosa, midline nasal  septum, no oral exudates, no LAN, trachea midline, MP 3, low laying soft palate  Respiratory - normal chest wall, normal respiratory effort, no accessory muscle use, no wheeze/rales  CV - s1s2 regular rate and rhythm, no murmurs, no peripheral edema, radial pulses symmetric  GI - soft, non tender, no masses  Lymph - no adenopathy noted in neck and axillary areas  MSK - normal gait  Ext - no cyanosis, clubbing, or joint inflammation noted  Skin - no rashes, lesions, or ulcers  Neuro - normal strength, oriented x 3  Psych - normal mood and affect   Assessment/Plan:   Obstructive sleep apnea. - he is compliant with CPAP and reports benefit from therapy - his machine is more than 64 yrs old; will arrange for new machine - will continue on auto CPAP 5 to 20 cm H2O - if he still feels like there is a lag in auto adjustments with new machine, could then  change him to fixed setting based on download results   Patient Instructions  Will arrange for new CPAP machine  Follow up in 2 months    Coralyn HellingVineet Dantonio Justen, MD [facility] Pager: [phone] 05/22/2018, 11:43 AM  Flow Sheet      Sleep tests:  PSG 06/27/98 >> AHI 37, SpO2 low 80% Auto CPAP 04/22/18 to 05/21/18 >> used on 27 of 30 nights with average 7 hrs 45 min.  Average AHI 10.2 with median CPAP 8 and 95 th percentile CPAP 11 cm H2O.  Medications:   Allergies as of 05/22/2018      Reactions   Vioxx [rofecoxib] Anaphylaxis   Throat swelling      Medication List        Accurate as of 05/22/18 11:43 AM. Always use your most recent med list.          acetaminophen 500 MG tablet Commonly known as:  TYLENOL Take 500 mg by mouth every 6 (six) hours as needed.   diphenoxylate-atropine 2.5-0.025 MG tablet Commonly known as:  LOMOTIL Take 1 tablet by mouth 4 (four) times daily as needed for diarrhea or loose stools.   fluticasone 50 MCG/ACT nasal spray Commonly known as:  FLONASE 1 spray each nostril twice a  day       Past Surgical History:  He  has a past surgical history that includes Umbilical hernia repair; Hernia repair; Trigger finger release (Left, 05/09/2015); and Colonoscopy (N/A, 06/27/2015).  Family History:  His family history includes Heart disease in his father.  Social History:  He  reports that he has never smoked. He has never used smokeless tobacco. He reports that he does not drink alcohol or use drugs.

## 2018-06-01 DIAGNOSIS — M65321 Trigger finger, right index finger: Secondary | ICD-10-CM | POA: Diagnosis not present

## 2018-06-01 DIAGNOSIS — R52 Pain, unspecified: Secondary | ICD-10-CM | POA: Diagnosis not present

## 2018-07-13 DIAGNOSIS — M65342 Trigger finger, left ring finger: Secondary | ICD-10-CM | POA: Diagnosis not present

## 2018-07-21 ENCOUNTER — Other Ambulatory Visit: Payer: Self-pay | Admitting: Adult Health

## 2018-07-21 DIAGNOSIS — J310 Chronic rhinitis: Secondary | ICD-10-CM

## 2018-07-21 DIAGNOSIS — T485X5A Adverse effect of other anti-common-cold drugs, initial encounter: Principal | ICD-10-CM

## 2018-07-22 ENCOUNTER — Ambulatory Visit: Payer: BLUE CROSS/BLUE SHIELD | Admitting: Pulmonary Disease

## 2018-07-27 DIAGNOSIS — G4733 Obstructive sleep apnea (adult) (pediatric): Secondary | ICD-10-CM | POA: Diagnosis not present

## 2018-08-17 ENCOUNTER — Telehealth: Payer: Self-pay | Admitting: Pulmonary Disease

## 2018-08-17 DIAGNOSIS — M65342 Trigger finger, left ring finger: Secondary | ICD-10-CM | POA: Diagnosis not present

## 2018-08-17 DIAGNOSIS — M65321 Trigger finger, right index finger: Secondary | ICD-10-CM | POA: Diagnosis not present

## 2018-08-17 NOTE — Telephone Encounter (Signed)
LMTCB. Please schedule patient a cpap f/u appt after March 6th to be in compliance with his 31 day f/u compliance.   Per air view report, patient has been using cpap for 19 days.

## 2018-08-18 ENCOUNTER — Ambulatory Visit: Payer: BLUE CROSS/BLUE SHIELD | Admitting: Pulmonary Disease

## 2018-08-18 NOTE — Telephone Encounter (Signed)
Pt is scheduled to see Dr. Craige Cotta on 09/08/2018. Nothing further was needed.

## 2018-08-27 DIAGNOSIS — G4733 Obstructive sleep apnea (adult) (pediatric): Secondary | ICD-10-CM | POA: Diagnosis not present

## 2018-09-08 ENCOUNTER — Encounter: Payer: Self-pay | Admitting: Pulmonary Disease

## 2018-09-08 ENCOUNTER — Ambulatory Visit (INDEPENDENT_AMBULATORY_CARE_PROVIDER_SITE_OTHER): Payer: BLUE CROSS/BLUE SHIELD | Admitting: Pulmonary Disease

## 2018-09-08 DIAGNOSIS — G4733 Obstructive sleep apnea (adult) (pediatric): Secondary | ICD-10-CM

## 2018-09-08 NOTE — Assessment & Plan Note (Signed)
Assessment: 1999 sleep study shows an AHI of 37 Current CPAP compliance report shows excellent compliance as well as an AHI of 1.4 Patient endorses that he has symptomatic relief when using CPAP Patient reports he uses it every night Patient denies any current symptoms Mallampati 1 BMI 37.56  Plan: Continue CPAP therapy Continue to work towards healthy BMI and lower weight Follow-up with our office in 1 year

## 2018-09-08 NOTE — Patient Instructions (Addendum)
We recommend that you continue using your CPAP daily >>>Keep up the hard work using your device >>> Goal should be wearing this for the entire night that you are sleeping, at least 4 to 6 hours  Remember:  . Do not drive or operate heavy machinery if tired or drowsy.  . Please notify the supply company and office if you are unable to use your device regularly due to missing supplies or machine being broken.  . Work on maintaining a healthy weight and following your recommended nutrition plan  . Maintain proper daily exercise and movement  . Maintaining proper use of your device can also help improve management of other chronic illnesses such as: Blood pressure, blood sugars, and weight management.   BiPAP/ CPAP Cleaning:  >>>Clean weekly, with Dawn soap, and bottle brush.  Set up to air dry.   Follow up in 1 year with Dr. Craige Cotta or Elisha Headland FNP   It is flu season:   >>> Best ways to protect herself from the flu: Receive the yearly flu vaccine, practice good hand hygiene washing with soap and also using hand sanitizer when available, eat a nutritious meals, get adequate rest, hydrate appropriately   Please contact the office if your symptoms worsen or you have concerns that you are not improving.   Thank you for choosing [facility] for your healthcare, and for allowing [place] to partner with you on your healthcare journey. I am thankful to be able to provide care to you today.   Elisha Headland FNP-C     CPAP and BPAP Information CPAP and BPAP are methods of helping a person breathe with the use of air pressure. CPAP stands for "continuous positive airway pressure." BPAP stands for "bi-level positive airway pressure." In both methods, air is blown through your nose or mouth and into your air passages to help you breathe well. CPAP and BPAP use different amounts of pressure to blow air. With CPAP, the amount of pressure stays the same while you breathe in and out. With BPAP, the  amount of pressure is increased when you breathe in (inhale) so that you can take larger breaths. Your health care provider will recommend whether CPAP or BPAP would be more helpful for you. Why are CPAP and BPAP treatments used? CPAP or BPAP can be helpful if you have:  Sleep apnea.  Chronic obstructive pulmonary disease (COPD).  Heart failure.  Medical conditions that weaken the muscles of the chest including muscular dystrophy, or neurological diseases such as amyotrophic lateral sclerosis (ALS).  Other problems that cause breathing to be weak, abnormal, or difficult. CPAP is most commonly used for obstructive sleep apnea (OSA) to keep the airways from collapsing when the muscles relax during sleep. How is CPAP or BPAP administered? Both CPAP and BPAP are provided by a small machine with a flexible plastic tube that attaches to a plastic mask. You wear the mask. Air is blown through the mask into your nose or mouth. The amount of pressure that is used to blow the air can be adjusted on the machine. Your health care provider will determine the pressure setting that should be used based on your individual needs. When should CPAP or BPAP be used? In most cases, the mask only needs to be worn during sleep. Generally, the mask needs to be worn throughout the night and during any daytime naps. People with certain medical conditions may also need to wear the mask at other times when they are  awake. Follow instructions from your health care provider about when to use the machine. What are some tips for using the mask?   Because the mask needs to be snug, some people feel trapped or closed-in (claustrophobic) when first using the mask. If you feel this way, you may need to get used to the mask. One way to do this is by holding the mask loosely over your nose or mouth and then gradually applying the mask more snugly. You can also gradually increase the amount of time that you use the mask.  Masks are  available in various types and sizes. Some fit over your mouth and nose while others fit over just your nose. If your mask does not fit well, talk with your health care provider about getting a different one.  If you are using a mask that fits over your nose and you tend to breathe through your mouth, a chin strap may be applied to help keep your mouth closed.  The CPAP and BPAP machines have alarms that may sound if the mask comes off or develops a leak.  If you have trouble with the mask, it is very important that you talk with your health care provider about finding a way to make the mask easier to tolerate. Do not stop using the mask. Stopping the use of the mask could have a negative impact on your health. What are some tips for using the machine?  Place your CPAP or BPAP machine on a secure table or stand near an electrical outlet.  Know where the on/off switch is located on the machine.  Follow instructions from your health care provider about how to set the pressure on your machine and when you should use it.  Do not eat or drink while the CPAP or BPAP machine is on. Food or fluids could get pushed into your lungs by the pressure of the CPAP or BPAP.  Do not smoke. Tobacco smoke residue can damage the machine.  For home use, CPAP and BPAP machines can be rented or purchased through home health care companies. Many different brands of machines are available. Renting a machine before purchasing may help you find out which particular machine works well for you.  Keep the CPAP or BPAP machine and attachments clean. Ask your health care provider for specific instructions. Get help right away if:  You have redness or open areas around your nose or mouth where the mask fits.  You have trouble using the CPAP or BPAP machine.  You cannot tolerate wearing the CPAP or BPAP mask.  You have pain, discomfort, and bloating in your abdomen. Summary  CPAP and BPAP are methods of helping a  person breathe with the use of air pressure.  Both CPAP and BPAP are provided by a small machine with a flexible plastic tube that attaches to a plastic mask.  If you have trouble with the mask, it is very important that you talk with your health care provider about finding a way to make the mask easier to tolerate. This information is not intended to replace advice given to you by your health care provider. Make sure you discuss any questions you have with your health care provider. Document Released: 03/15/2004 Document Revised: 02/17/2018 Document Reviewed: 05/06/2016 Elsevier Interactive Patient Education  2019 [employer].

## 2018-09-08 NOTE — Progress Notes (Signed)
Reviewed and agree with assessment/plan.   Kyley Solow, MD [facility] 06/26/2016, 12:24 PM Pager:  [phone]  

## 2018-09-08 NOTE — Progress Notes (Signed)
@Patient  ID: Richard Nolan., male    DOB: 12/27/1955, 63 y.o.   MRN: [id number]  Chief Complaint  Patient presents with  . Follow-up    OSA    Referring provider: Carylon Perches, MD  HPI:  63 year old male never smoker followed in our office for obstructive sleep apnea  PMH: allergic rhinitis Smoker/ Smoking History: Never smoker Maintenance:  None Pt of: Dr. Craige Cotta  09/08/2018  - Visit   63 year old male never smoker followed in office for obstructive sleep apnea.  Patient CPAP compliance report showing excellent compliance.  Compliance report is listed below:  CPAP compliance report: 30 out of last 30 days use, all 30 those days greater than 4 hours, average usage 6 hours and 26 minutes, APAP settings 5-20, AHI 1.4  Patient reports that he has to use his CPAP.  When he does not sleep with the CPAP on such as when he takes naps occasionally he feels more fatigued.   Tests:   NPSG 1999:  37/hr with desat to 80%.   Titration study to cpap 15cm 2001.  PSG 06/27/98 >> AHI 37, SpO2 low 80% Auto CPAP 04/22/18 to 05/21/18 >> used on 27 of 30 nights with average 7 hrs 45 min.  Average AHI 10.2 with median CPAP 8 and 95 th percentile CPAP 11 cm H2O.  FENO:  No results found for: NITRICOXIDE  PFT: No flowsheet data found.  Imaging: No results found.    Specialty Problems      Pulmonary Problems   OSA (obstructive sleep apnea)    NPSG 1999:  37/hr with desat to 80%.  Titration study to cpap 15cm 2001. Currently on auto      Allergic rhinitis      Allergies  Allergen Reactions  . Vioxx [Rofecoxib] Anaphylaxis    Throat swelling    Immunization History  Administered Date(s) Administered  . Influenza Split 05/01/2013, 05/06/2016, 04/18/2017, 04/14/2018  . Influenza Whole 03/02/2011, 03/31/2012  . Influenza-Unspecified 03/31/2014  . Zoster Recombinat (Shingrix) 12/08/2017, 02/28/2018    Past Medical History:  Diagnosis Date  . OSA (obstructive sleep apnea)      CPAP nightly  . Stenosing tenosynovitis of finger of left hand    left index and left middle fingers    Tobacco History: Social History   Tobacco Use  Smoking Status Never Smoker  Smokeless Tobacco Never Used   Counseling given: Yes   Outpatient Encounter Medications as of 09/08/2018  Medication Sig  . acetaminophen (TYLENOL) 500 MG tablet Take 500 mg by mouth every 6 (six) hours as needed.  . fluticasone (FLONASE) 50 MCG/ACT nasal spray USE 1 SPRAY IN EACH NOSTRIL TWICE A DAY  . [DISCONTINUED] diphenoxylate-atropine (LOMOTIL) 2.5-0.025 MG tablet Take 1 tablet by mouth 4 (four) times daily as needed for diarrhea or loose stools. (Patient not taking: Reported on 09/08/2018)   No facility-administered encounter medications on file as of 09/08/2018.      Review of Systems  Review of Systems  Constitutional: Negative for activity change, chills, fatigue, fever and unexpected weight change.  HENT: Negative for postnasal drip, rhinorrhea, sinus pressure and sinus pain.   Eyes: Negative.   Respiratory: Negative for cough, shortness of breath and wheezing.   Cardiovascular: Negative for chest pain and palpitations.  Gastrointestinal: Negative for diarrhea, nausea and vomiting.  Endocrine: Negative.   Musculoskeletal: Negative.   Skin: Negative.   Neurological: Negative for dizziness and headaches.  Psychiatric/Behavioral: Negative.  Negative for dysphoric mood. The patient is  not nervous/anxious.   All other systems reviewed and are negative.    Physical Exam  BP 134/82 (BP Location: Left Arm, Cuff Size: Large)   Pulse 77   Temp 98 F (36.7 C) (Oral)   Ht 5\' 10"  (1.778 m)   Wt 261 lb 12.8 oz (118.8 kg)   SpO2 96%   BMI 37.56 kg/m   Wt Readings from Last 5 Encounters:  09/08/18 261 lb 12.8 oz (118.8 kg)  05/22/18 254 lb 9.6 oz (115.5 kg)  05/09/18 254 lb 3.2 oz (115.3 kg)  10/06/17 250 lb 6.4 oz (113.6 kg)  07/23/17 255 lb (115.7 kg)     Physical Exam   Constitutional: He is oriented to person, place, and time and well-developed, well-nourished, and in no distress. No distress.  HENT:  Head: Normocephalic and atraumatic.  Right Ear: Hearing, tympanic membrane, external ear and ear canal normal.  Left Ear: Hearing, tympanic membrane, external ear and ear canal normal.  Nose: Nose normal. Right sinus exhibits no maxillary sinus tenderness and no frontal sinus tenderness. Left sinus exhibits no maxillary sinus tenderness and no frontal sinus tenderness.  Mouth/Throat: Uvula is midline and oropharynx is clear and moist. No oropharyngeal exudate.  Mallampati 1  Eyes: Pupils are equal, round, and reactive to light.  Neck: Normal range of motion. Neck supple.  Cardiovascular: Normal rate, regular rhythm and normal heart sounds.  Pulmonary/Chest: Effort normal and breath sounds normal. No accessory muscle usage. No respiratory distress. He has no decreased breath sounds. He has no wheezes. He has no rhonchi.  Musculoskeletal: Normal range of motion.  Lymphadenopathy:    He has no cervical adenopathy.  Neurological: He is alert and oriented to person, place, and time. Gait normal.  Skin: Skin is warm and dry. He is not diaphoretic. No erythema.  Psychiatric: Mood, memory, affect and judgment normal.  Nursing note and vitals reviewed.     Lab Results:  CBC No results found for: WBC, RBC, HGB, HCT, PLT, MCV, MCH, MCHC, RDW, LYMPHSABS, MONOABS, EOSABS, BASOSABS  BMET No results found for: NA, K, CL, CO2, GLUCOSE, BUN, CREATININE, CALCIUM, GFRNONAA, GFRAA  BNP No results found for: BNP  ProBNP No results found for: PROBNP    Assessment & Plan:     OSA (obstructive sleep apnea) Assessment: 1999 sleep study shows an AHI of 37 Current CPAP compliance report shows excellent compliance as well as an AHI of 1.4 Patient endorses that he has symptomatic relief when using CPAP Patient reports he uses it every night Patient denies any  current symptoms Mallampati 1 BMI 37.56  Plan: Continue CPAP therapy Continue to work towards healthy BMI and lower weight Follow-up with our office in 1 year     Coral Ceo, NP 09/08/2018   This appointment was 15 min long with over 50% of the time in direct face-to-face patient care, assessment, plan of care, and follow-up.

## 2018-09-20 ENCOUNTER — Encounter: Payer: Self-pay | Admitting: Pulmonary Disease

## 2018-09-23 ENCOUNTER — Telehealth: Payer: Self-pay | Admitting: Pulmonary Disease

## 2018-09-23 NOTE — Telephone Encounter (Signed)
Auto CPAP 08/22/18 to 09/20/18 >> used on 30 of 30 nights with average 6 hrs 26 min.  Average AHI 1.7 with median CPAP 11 and 95 th percentile CPAP 13 cm H2O.   Please fax CPAP download to listed number.  Download is on my office desk.

## 2018-09-23 NOTE — Telephone Encounter (Signed)
Obtained paperwork from Dr. Craige Cotta.  Information faxed as requested. Waiting on confirmation.

## 2018-09-24 NOTE — Telephone Encounter (Signed)
Fax confirmation received. Nothing further needed at this time. 

## 2018-11-13 DIAGNOSIS — H6983 Other specified disorders of Eustachian tube, bilateral: Secondary | ICD-10-CM | POA: Diagnosis not present

## 2018-11-13 DIAGNOSIS — R42 Dizziness and giddiness: Secondary | ICD-10-CM | POA: Diagnosis not present

## 2018-11-13 DIAGNOSIS — H903 Sensorineural hearing loss, bilateral: Secondary | ICD-10-CM | POA: Diagnosis not present

## 2018-11-17 DIAGNOSIS — R42 Dizziness and giddiness: Secondary | ICD-10-CM | POA: Diagnosis not present

## 2018-11-17 DIAGNOSIS — H903 Sensorineural hearing loss, bilateral: Secondary | ICD-10-CM | POA: Diagnosis not present

## 2018-11-24 DIAGNOSIS — H832X1 Labyrinthine dysfunction, right ear: Secondary | ICD-10-CM | POA: Diagnosis not present

## 2018-11-26 DIAGNOSIS — M25561 Pain in right knee: Secondary | ICD-10-CM | POA: Diagnosis not present

## 2018-11-26 DIAGNOSIS — M1711 Unilateral primary osteoarthritis, right knee: Secondary | ICD-10-CM | POA: Diagnosis not present

## 2018-12-07 DIAGNOSIS — H832X1 Labyrinthine dysfunction, right ear: Secondary | ICD-10-CM | POA: Diagnosis not present

## 2019-02-16 DIAGNOSIS — L821 Other seborrheic keratosis: Secondary | ICD-10-CM | POA: Diagnosis not present

## 2019-02-16 DIAGNOSIS — C44612 Basal cell carcinoma of skin of right upper limb, including shoulder: Secondary | ICD-10-CM | POA: Diagnosis not present

## 2019-02-16 DIAGNOSIS — L814 Other melanin hyperpigmentation: Secondary | ICD-10-CM | POA: Diagnosis not present

## 2019-02-16 DIAGNOSIS — L570 Actinic keratosis: Secondary | ICD-10-CM | POA: Diagnosis not present

## 2019-02-16 DIAGNOSIS — L820 Inflamed seborrheic keratosis: Secondary | ICD-10-CM | POA: Diagnosis not present

## 2019-02-16 DIAGNOSIS — L918 Other hypertrophic disorders of the skin: Secondary | ICD-10-CM | POA: Diagnosis not present

## 2019-03-05 DIAGNOSIS — M1711 Unilateral primary osteoarthritis, right knee: Secondary | ICD-10-CM | POA: Diagnosis not present

## 2019-03-24 DIAGNOSIS — M65342 Trigger finger, left ring finger: Secondary | ICD-10-CM | POA: Diagnosis not present

## 2019-05-05 DIAGNOSIS — M65342 Trigger finger, left ring finger: Secondary | ICD-10-CM | POA: Diagnosis not present

## 2019-05-05 DIAGNOSIS — M65321 Trigger finger, right index finger: Secondary | ICD-10-CM | POA: Diagnosis not present

## 2019-05-25 DIAGNOSIS — R7303 Prediabetes: Secondary | ICD-10-CM | POA: Diagnosis not present

## 2019-05-25 DIAGNOSIS — Z125 Encounter for screening for malignant neoplasm of prostate: Secondary | ICD-10-CM | POA: Diagnosis not present

## 2019-05-25 DIAGNOSIS — Z79899 Other long term (current) drug therapy: Secondary | ICD-10-CM | POA: Diagnosis not present

## 2019-05-25 DIAGNOSIS — G4733 Obstructive sleep apnea (adult) (pediatric): Secondary | ICD-10-CM | POA: Diagnosis not present

## 2019-05-25 DIAGNOSIS — N183 Chronic kidney disease, stage 3 unspecified: Secondary | ICD-10-CM | POA: Diagnosis not present

## 2019-06-04 DIAGNOSIS — R7309 Other abnormal glucose: Secondary | ICD-10-CM | POA: Diagnosis not present

## 2019-06-04 DIAGNOSIS — G4733 Obstructive sleep apnea (adult) (pediatric): Secondary | ICD-10-CM | POA: Diagnosis not present

## 2019-06-04 DIAGNOSIS — Z23 Encounter for immunization: Secondary | ICD-10-CM | POA: Diagnosis not present

## 2019-06-04 DIAGNOSIS — N1831 Chronic kidney disease, stage 3a: Secondary | ICD-10-CM | POA: Diagnosis not present

## 2020-02-24 ENCOUNTER — Other Ambulatory Visit: Payer: Self-pay

## 2020-02-24 ENCOUNTER — Ambulatory Visit
Admission: EM | Admit: 2020-02-24 | Discharge: 2020-02-24 | Disposition: A | Discharge status: Home / Self Care | Payer: Medicare Other

## 2020-02-24 DIAGNOSIS — H6123 Impacted cerumen, bilateral: Secondary | ICD-10-CM

## 2020-02-24 NOTE — Discharge Instructions (Addendum)
  Ear lavage performed in office Avoid using q-tips as this may contribute to ear wax impaction May use OTC debrox drops to help  Use OTC ibuprofen and/ or tylenol as needed for pain control Follow up with PCP if symptoms persists Return here or go to the ER if you have any new or worsening symptoms

## 2020-02-24 NOTE — ED Provider Notes (Signed)
[facility]   [id number] 02/24/20 Arrival Time: 1450  CC:EAR clogged  SUBJECTIVE: History from: patient.  Richard Nolan. is a 64 y.o. male who presents with of bilateral clogged ears unsure of length of symptoms.  Denies a precipitating event, but does admit to wearing ear plugs.  Denies pain.  Denies alleviating or aggravating factors.  Reports similar symptoms in the past that improved with ear lavage.    Denies fever, chills, fatigue, sinus pain, rhinorrhea, ear discharge, sore throat, SOB, wheezing, chest pain, nausea, changes in bowel or bladder habits.    ROS: As per HPI.  All other pertinent ROS negative.     Past Medical History:  Diagnosis Date  . OSA (obstructive sleep apnea)    CPAP nightly  . Stenosing tenosynovitis of finger of left hand    left index and left middle fingers   Past Surgical History:  Procedure Laterality Date  . COLONOSCOPY N/A 06/27/2015   Procedure: COLONOSCOPY;  Surgeon: Malissa Hippo, MD;  Location: [facility];  Service: Endoscopy;  Laterality: N/A;  1030  . HERNIA REPAIR    . TRIGGER FINGER RELEASE Left 05/09/2015   Procedure: RELEASE TRIGGER FINGER/A-1 PULLEY, left index finger and left middle finger;  Surgeon: Cindee Salt, MD;  Location: [facility];  Service: Orthopedics;  Laterality: Left;  . UMBILICAL HERNIA REPAIR     Allergies  Allergen Reactions  . Vioxx [Rofecoxib] Anaphylaxis    Throat swelling   No current facility-administered medications on file prior to encounter.   Current Outpatient Medications on File Prior to Encounter  Medication Sig Dispense Refill  . acetaminophen (TYLENOL) 500 MG tablet Take 500 mg by mouth every 6 (six) hours as needed.    . fluticasone (FLONASE) 50 MCG/ACT nasal spray USE 1 SPRAY IN EACH NOSTRIL TWICE A DAY 48 g 4   Social History   Socioeconomic History  . Marital status: Married    Spouse name: Not on file  . Number of children: Y  . Years of education: Not on  file  . Highest education level: Not on file  Occupational History  . Occupation: truck Hospital doctor  Tobacco Use  . Smoking status: Never Smoker  . Smokeless tobacco: Never Used  Substance and Sexual Activity  . Alcohol use: No    Alcohol/week: 0.0 standard drinks  . Drug use: No  . Sexual activity: Not on file  Other Topics Concern  . Not on file  Social History Narrative  . Not on file   Social Determinants of Health   Financial Resource Strain:   . Difficulty of Paying Living Expenses: Not on file  Food Insecurity:   . Worried About Programme researcher, broadcasting/film/video in the Last Year: Not on file  . Ran Out of Food in the Last Year: Not on file  Transportation Needs:   . Lack of Transportation (Medical): Not on file  . Lack of Transportation (Non-Medical): Not on file  Physical Activity:   . Days of Exercise per Week: Not on file  . Minutes of Exercise per Session: Not on file  Stress:   . Feeling of Stress : Not on file  Social Connections:   . Frequency of Communication with Friends and Family: Not on file  . Frequency of Social Gatherings with Friends and Family: Not on file  . Attends Religious Services: Not on file  . Active Member of Clubs or Organizations: Not on file  . Attends Banker  Meetings: Not on file  . Marital Status: Not on file  Intimate Partner Violence:   . Fear of Current or Ex-Partner: Not on file  . Emotionally Abused: Not on file  . Physically Abused: Not on file  . Sexually Abused: Not on file   Family History  Problem Relation Age of Onset  . Heart disease Father     OBJECTIVE:  There were no vitals filed for this visit.   General appearance: alert; well-appearing, nontoxic; speaking in full sentences and tolerating own secretions HEENT: NCAT; Ears: EACs with cerumen impaction; Eyes: PERRL.  EOM grossly intact.Nose: nares patent without rhinorrhea, Throat: oropharynx clear, tonsils non erythematous or enlarged, uvula midline  Neck: supple  without LAD Lungs: normal respiratory effort Skin: warm and dry Psychological: alert and cooperative; normal mood and affect   PROCEDURE:  Consent granted.  Bilateral ear lavage performed by RN.  TMs visualized.  PT tolerated procedure well.     ASSESSMENT & PLAN:  1. Bilateral impacted cerumen     Ear lavage performed in office Avoid using q-tips as this may contribute to ear wax impaction May use OTC debrox drops to help  Use OTC ibuprofen and/ or tylenol as needed for pain control Follow up with PCP if symptoms persists Return here or go to the ER if you have any new or worsening symptoms   Reviewed expectations re: course of current medical issues. Questions answered. Outlined signs and symptoms indicating need for more acute intervention. Patient verbalized understanding. After Visit Summary given.         Rennis Harding, PA-C 02/24/20 1527

## 2020-02-24 NOTE — ED Triage Notes (Signed)
Pt presents with c/o ears clogged , denies pain

## 2020-05-09 ENCOUNTER — Telehealth: Payer: Self-pay | Admitting: Orthopedic Surgery

## 2020-05-09 NOTE — Telephone Encounter (Signed)
Per voice message received, called back to patient - left message.* *Patient returned call, and has been scheduled with Dr Romeo Apple as requested.

## 2020-05-18 ENCOUNTER — Encounter: Payer: Self-pay | Admitting: Orthopedic Surgery

## 2020-05-18 ENCOUNTER — Other Ambulatory Visit: Payer: Self-pay

## 2020-05-18 ENCOUNTER — Ambulatory Visit: Payer: Medicare Other

## 2020-05-18 ENCOUNTER — Ambulatory Visit (INDEPENDENT_AMBULATORY_CARE_PROVIDER_SITE_OTHER): Payer: Medicare Other | Admitting: Orthopedic Surgery

## 2020-05-18 VITALS — BP 130/80 | HR 79 | Ht 70.0 in | Wt 255.0 lb

## 2020-05-18 DIAGNOSIS — G8929 Other chronic pain: Secondary | ICD-10-CM | POA: Diagnosis not present

## 2020-05-18 DIAGNOSIS — M25511 Pain in right shoulder: Secondary | ICD-10-CM | POA: Diagnosis not present

## 2020-05-18 DIAGNOSIS — M25512 Pain in left shoulder: Secondary | ICD-10-CM

## 2020-05-18 DIAGNOSIS — M778 Other enthesopathies, not elsewhere classified: Secondary | ICD-10-CM | POA: Diagnosis not present

## 2020-05-18 NOTE — Patient Instructions (Signed)
Take it easy for a few days  He has rotator cuff tendinitis and bursitis and this requires rest  We did put some medication around the rotator cuff to provide some pain relief but rest Liniment and ibuprofen will help as well  You have received an injection of steroids into the joint. 15% of patients will have increased pain within the 24 hours postinjection.   This is transient and will go away.   We recommend that you use ice packs on the injection site for 20 minutes every 2 hours and extra strength Tylenol 2 tablets every 8 as needed until the pain resolves.  If you continue to have pain after taking the Tylenol and using the ice please call the office for further instructions.

## 2020-05-18 NOTE — Progress Notes (Signed)
Chief Complaint  Patient presents with  . Shoulder Pain    bilateral     History of present illness 64 year old male presents with 4 to 6-week history of bilateral shoulder pain he status post bilateral shoulder dislocations 30 years ago recovered nicely with no problems.  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 OSA (obstructive sleep apnea)    CPAP nightly  . Stenosing tenosynovitis of finger of left hand    left index and left middle fingers    Current Outpatient Medications:  .  acetaminophen (TYLENOL) 500 MG tablet, Take 500 mg by mouth every 6 (six) hours as needed., Disp: , Rfl:  .  fluticasone (FLONASE) 50 MCG/ACT nasal spray, USE 1 SPRAY IN EACH NOSTRIL TWICE A DAY, Disp: 48 g, Rfl: 4  BP 130/80   Pulse 79   Ht 5\' 10"  (1.778 m)   Wt 255 lb (115.7 kg)   BMI 36.59 kg/m   He is awake and alert he is oriented x3 his mood and affect is normal he has some obesity.  His awake and alert.  His right shoulder has full range of motion as does the left he has no weakness in either the shoulder and the rotator cuff.  There is no instability in either shoulder.  He is neurovascular intact  Skin normal bilaterally with the exception of some pigmented lesions which are diffusely about his back shoulder and arms  His x-rays show arthritis of the glenohumeral joint mild and AC joint mild to moderate  Encounter Diagnoses  Name Primary?  . Chronic right shoulder pain   . Chronic left shoulder pain   . Tendinitis of right shoulder Yes  . Tendinitis of left shoulder     He most likely has bursitis tendinitis with some  arthritis of the shoulder but I think the tendinitis in the cuff is the main problem  Recommend rest liniment ibuprofen and 2 injections follow-up as needed  Procedure injection right shoulder subacromial joint and left shoulder subacromial joint   procedure note the subacromial injection shoulder RIGHT  Verbal consent was obtained to inject the  RIGHT   Shoulder  Timeout was completed to confirm the injection site is a subacromial space of the  RIGHT  shoulder   Medication used Depo-Medrol 40 mg and lidocaine 1% 3 cc  Anesthesia was provided by ethyl chloride  The injection was performed in the RIGHT  posterior subacromial space. After pinning the skin with alcohol and anesthetized the skin with ethyl chloride the subacromial space was injected using a 20-gauge needle. There were no complications  Sterile dressing was applied.    Procedure note the subacromial injection shoulder left   Verbal consent was obtained to inject the  Left   Shoulder  Timeout was completed to confirm the injection site is a subacromial space of the  left  shoulder  Medication used Depo-Medrol 40 mg and lidocaine 1% 3 cc  Anesthesia was provided by ethyl chloride  The injection was performed in the left  posterior subacromial space. After pinning the skin  with alcohol and anesthetized the skin with ethyl chloride the subacromial space was injected using a 20-gauge needle. There were no complications  Sterile dressing was applied.

## 2021-06-21 ENCOUNTER — Other Ambulatory Visit (HOSPITAL_COMMUNITY): Payer: Self-pay | Admitting: Internal Medicine

## 2021-06-21 DIAGNOSIS — N189 Chronic kidney disease, unspecified: Secondary | ICD-10-CM

## 2021-06-28 ENCOUNTER — Ambulatory Visit (HOSPITAL_COMMUNITY)
Admission: RE | Admit: 2021-06-28 | Discharge: 2021-06-28 | Disposition: A | Discharge status: Home / Self Care | Payer: Medicare Other | Source: Ambulatory Visit | Attending: Internal Medicine | Admitting: Internal Medicine

## 2021-06-28 ENCOUNTER — Other Ambulatory Visit: Payer: Self-pay

## 2021-06-28 ENCOUNTER — Other Ambulatory Visit (HOSPITAL_COMMUNITY): Payer: Self-pay | Admitting: Internal Medicine

## 2021-06-28 ENCOUNTER — Other Ambulatory Visit (HOSPITAL_COMMUNITY): Payer: Self-pay | Admitting: Nurse Practitioner

## 2021-06-28 DIAGNOSIS — N189 Chronic kidney disease, unspecified: Secondary | ICD-10-CM | POA: Diagnosis not present

## 2021-06-29 ENCOUNTER — Ambulatory Visit
Admission: RE | Admit: 2021-06-29 | Discharge: 2021-06-29 | Disposition: A | Discharge status: Home / Self Care | Payer: Medicare Other | Source: Ambulatory Visit

## 2021-06-29 ENCOUNTER — Other Ambulatory Visit: Payer: Self-pay

## 2021-06-29 VITALS — BP 152/75 | HR 86 | Temp 98.0°F | Resp 18

## 2021-06-29 DIAGNOSIS — H6123 Impacted cerumen, bilateral: Secondary | ICD-10-CM

## 2021-06-29 MED ORDER — CARBAMIDE PEROXIDE 6.5 % OT SOLN: 5.0000 [drp] | Freq: Two times a day (BID) | OTIC | 0 refills | Status: DC | PRN

## 2021-06-29 NOTE — ED Provider Notes (Signed)
[facility]    CSN: [id number] Arrival date & time: 06/29/21  1359      History   Chief Complaint Chief Complaint  Patient presents with   Cerumen Impaction    Needs ears flushed    HPI Richard Nolan. is a 65 y.o. male.   Presenting today requesting ear lavage, states that he has it done every year or so for cerumen impaction and his PCP told him that it was about time last time he saw him.  Denies loss of hearing, significant pain, drainage, headache.  Not trying anything over-the-counter for symptoms.   Past Medical History:  Diagnosis Date   OSA (obstructive sleep apnea)    CPAP nightly   Stenosing tenosynovitis of finger of left hand    left index and left middle fingers    Patient Active Problem List   Diagnosis Date Noted   Allergic rhinitis 05/19/2017   Acquired trigger finger 05/22/2015   Other disorders of synovium, tendon, and bursa(727.89) 12/14/2012   Disorder of synovium 12/14/2012   OSA (obstructive sleep apnea) 10/22/2010    Past Surgical History:  Procedure Laterality Date   COLONOSCOPY N/A 06/27/2015   Procedure: COLONOSCOPY;  Surgeon: Malissa Hippo, MD;  [facility];  Service: Endoscopy;  Laterality: N/A;  1030   HERNIA REPAIR     TRIGGER FINGER RELEASE Left 05/09/2015   Procedure: RELEASE TRIGGER FINGER/A-1 PULLEY, left index finger and left middle finger;  Surgeon: Cindee Salt, MD;  Location: [facility];  Service: Orthopedics;  Laterality: Left;   UMBILICAL HERNIA REPAIR       Home Medications    Prior to Admission medications   Medication Sig Start Date End Date Taking? Authorizing Provider  carbamide peroxide (DEBROX) 6.5 % OTIC solution Place 5 drops into both ears 2 (two) times daily as needed. 06/29/21  Yes Particia Nearing, PA-C  acetaminophen (TYLENOL) 500 MG tablet Take 500 mg by mouth every 6 (six) hours as needed.    [provider]  allopurinol (ZYLOPRIM) 100 MG tablet Take  100 mg by mouth daily. 06/18/21   [provider]  fluticasone Aleda Grana) 50 MCG/ACT nasal spray USE 1 SPRAY IN EACH NOSTRIL TWICE A DAY 07/21/18   Coralyn Helling, MD    Family History Family History  Problem Relation Age of Onset   Heart disease Father     Social History Social History   Tobacco Use   Smoking status: Never   Smokeless tobacco: Never  Vaping Use   Vaping Use: Never used  Substance Use Topics   Alcohol use: No    Alcohol/week: 0.0 standard drinks   Drug use: No     Allergies   Vioxx [rofecoxib]   Review of Systems Review of Systems Per HPI  Physical Exam Triage Vital Signs ED Triage Vitals [06/29/21 1411]  Enc Vitals Group     BP (!) 152/75     Pulse Rate 86     Resp 18     Temp 98 F (36.7 C)     Temp Source Oral     SpO2 98 %     Weight      Height      Head Circumference      Peak Flow      Pain Score 0     Pain Loc      Pain Edu?      Excl. in GC?    No data found.  Updated Vital Signs BP (!) 152/75 (BP Location: Right Arm)    Pulse 86    Temp 98 F (36.7 C) (Oral)    Resp 18    SpO2 98%   Visual Acuity Right Eye Distance:   Left Eye Distance:   Bilateral Distance:    Right Eye Near:   Left Eye Near:    Bilateral Near:     Physical Exam Vitals and nursing note reviewed.  Constitutional:      Appearance: Normal appearance.  HENT:     Head: Atraumatic.     Right Ear: There is impacted cerumen.     Left Ear: There is impacted cerumen.  Eyes:     Extraocular Movements: Extraocular movements intact.     Conjunctiva/sclera: Conjunctivae normal.  Cardiovascular:     Rate and Rhythm: Normal rate and regular rhythm.  Pulmonary:     Effort: Pulmonary effort is normal.     Breath sounds: Normal breath sounds.  Musculoskeletal:        General: Normal range of motion.     Cervical back: Normal range of motion and neck supple.  Skin:    General: Skin is warm and dry.  Neurological:     General: No focal deficit  present.     Mental Status: He is oriented to person, place, and time.  Psychiatric:        Mood and Affect: Mood normal.        Thought Content: Thought content normal.        Judgment: Judgment normal.     UC Treatments / Results  Labs (all labs ordered are listed, but only abnormal results are displayed) Labs Reviewed - No data to display  EKG   Radiology US RENAL  Result Date: 06/29/2021 CLINICAL DATA:  Elevated laboratory values gout medication management EXAM: RENAL / URINARY TRACT ULTRASOUND COMPLETE COMPARISON:  None. FINDINGS: Right Kidney: Renal measurements: 9.6 x 4.9 x 5.8 cm = volume: 142 mL. Echogenicity within normal limits. No mass or hydronephrosis visualized. Left Kidney: Renal measurements: 11.1 x 5.8 x 4.9 cm = volume: 165 mL. Echogenicity within normal limits. No mass or hydronephrosis visualized. Bladder: Appears normal for degree of bladder distention. Other: None. IMPRESSION: No significant sonographic abnormality of the kidneys. Electronically Signed   By: Acquanetta Belling M.D.   On: 06/29/2021 11:46    Procedures Procedures (including critical care time)  Medications Ordered in UC Medications - No data to display  Initial Impression / Assessment and Plan / UC Course  I have reviewed the triage vital signs and the nursing notes.  Pertinent labs & imaging results that were available during my care of the patient were reviewed by me and considered in my medical decision making (see chart for details).     Lavage performed with warm water and peroxide to bilateral ears, left ear canal free from debris post lavage with TM visualized and benign.  Right EAC still with cerumen debris with poor visualization of the TM.  Debrox drops sent for remainder of debris.  Final Clinical Impressions(s) / UC Diagnoses   Final diagnoses:  Bilateral impacted cerumen   Discharge Instructions   None    ED Prescriptions     Medication Sig Dispense Auth. Provider    carbamide peroxide (DEBROX) 6.5 % OTIC solution Place 5 drops into both ears 2 (two) times daily as needed. 15 mL Particia Nearing, [place]      PDMP not reviewed this encounter.  Particia Nearing, [place] 06/29/21 1500

## 2021-06-29 NOTE — ED Triage Notes (Signed)
Patient states his right ear is stopped up and while he is here he would like the left one cleaned out today.  Patient states that he has his ears cleaned every 2 years and it is time now.

## 2021-08-08 ENCOUNTER — Other Ambulatory Visit: Payer: Self-pay

## 2021-08-08 ENCOUNTER — Ambulatory Visit (HOSPITAL_COMMUNITY)
Admission: RE | Admit: 2021-08-08 | Discharge: 2021-08-08 | Disposition: A | Discharge status: Home / Self Care | Payer: Self-pay | Source: Ambulatory Visit | Attending: Internal Medicine | Admitting: Internal Medicine

## 2021-08-08 DIAGNOSIS — I700 Atherosclerosis of aorta: Secondary | ICD-10-CM | POA: Insufficient documentation

## 2021-08-10 ENCOUNTER — Encounter: Payer: Self-pay | Admitting: Cardiology

## 2021-08-10 ENCOUNTER — Ambulatory Visit (INDEPENDENT_AMBULATORY_CARE_PROVIDER_SITE_OTHER): Payer: Medicare Other | Admitting: Cardiology

## 2021-08-10 ENCOUNTER — Other Ambulatory Visit: Payer: Self-pay

## 2021-08-10 VITALS — BP 130/76 | HR 69 | Ht 70.0 in | Wt 259.0 lb

## 2021-08-10 DIAGNOSIS — I2510 Atherosclerotic heart disease of native coronary artery without angina pectoris: Secondary | ICD-10-CM

## 2021-08-10 DIAGNOSIS — I77819 Aortic ectasia, unspecified site: Secondary | ICD-10-CM

## 2021-08-10 DIAGNOSIS — G4733 Obstructive sleep apnea (adult) (pediatric): Secondary | ICD-10-CM | POA: Diagnosis not present

## 2021-08-10 DIAGNOSIS — Z719 Counseling, unspecified: Secondary | ICD-10-CM

## 2021-08-10 NOTE — Progress Notes (Signed)
Cardiology Office Note:    Date:  08/10/2021   ID:  Richard Nolan., DOB 07-11-1955, MRN [id number]  PCP:  Asencion Noble, MD  Cardiologist:  Berniece Salines, DO  Electrophysiologist:  None   Referring MD: Asencion Noble, MD   " I recently had a scan of the heart"  History of Present Illness:    Richard Nolan. is a 66 y.o. male with a hx of obstructive sleep apnea on CPAP nightly, obesity, recently had a CT cardiac scoring showing coronary calcification with calcium score of 543 placing him at 80th percentile and mildly dilated proximal ascending aorta. With this he was sent by his primary care doctor to be evaluated cardiology.  Today he offers no complaints.  He denies any chest pain, shortness of breath lightheadedness or dizziness.  Past Medical History:  Diagnosis Date   OSA (obstructive sleep apnea)    CPAP nightly   Stenosing tenosynovitis of finger of left hand    left index and left middle fingers    Past Surgical History:  Procedure Laterality Date   COLONOSCOPY N/A 06/27/2015   Procedure: COLONOSCOPY;  Surgeon: Rogene Houston, MD;  [facility];  Service: Endoscopy;  Laterality: N/A;  [place] Left 05/09/2015   Procedure: RELEASE TRIGGER FINGER/A-1 PULLEY, left index finger and left middle finger;  Surgeon: Daryll Brod, MD;  Location: [facility];  Service: Orthopedics;  Laterality: Left;   UMBILICAL HERNIA REPAIR      Current Medications: Current Meds  Medication Sig   allopurinol (ZYLOPRIM) 100 MG tablet Take 100 mg by mouth daily.   ASPIRIN 81 PO Take by mouth daily.   rosuvastatin (CRESTOR) 20 MG tablet Take 20 mg by mouth at bedtime.     Allergies:   Vioxx [rofecoxib]   Social History   Socioeconomic History   Marital status: Married    Spouse name: Not on file   Number of children: Y   Years of education: Not on file   Highest education level: Not on file  Occupational History   Occupation:  truck driver  Tobacco Use   Smoking status: Never   Smokeless tobacco: Never  Vaping Use   Vaping Use: Never used  Substance and Sexual Activity   Alcohol use: No    Alcohol/week: 0.0 standard drinks   Drug use: No   Sexual activity: Never  Other Topics Concern   Not on file  Social History Narrative   Not on file   Social Determinants of Health   Financial Resource Strain: Not on file  Food Insecurity: Not on file  Transportation Needs: Not on file  Physical Activity: Not on file  Stress: Not on file  Social Connections: Not on file     Family History: The patient's family history includes Heart disease in his father.  ROS:   Review of Systems  Constitution: Negative for decreased appetite, fever and weight gain.  HENT: Negative for congestion, ear discharge, hoarse voice and sore throat.   Eyes: Negative for discharge, redness, vision loss in right eye and visual halos.  Cardiovascular: Negative for chest pain, dyspnea on exertion, leg swelling, orthopnea and palpitations.  Respiratory: Negative for cough, hemoptysis, shortness of breath and snoring.   Endocrine: Negative for heat intolerance and polyphagia.  Hematologic/Lymphatic: Negative for bleeding problem. Does not bruise/bleed easily.  Skin: Negative for flushing, nail changes, rash and suspicious lesions.  Musculoskeletal: Negative  for arthritis, joint pain, muscle cramps, myalgias, neck pain and stiffness.  Gastrointestinal: Negative for abdominal pain, bowel incontinence, diarrhea and excessive appetite.  Genitourinary: Negative for decreased libido, genital sores and incomplete emptying.  Neurological: Negative for brief paralysis, focal weakness, headaches and loss of balance.  Psychiatric/Behavioral: Negative for altered mental status, depression and suicidal ideas.  Allergic/Immunologic: Negative for HIV exposure and persistent infections.    EKGs/Labs/Other Studies Reviewed:    The following studies  were reviewed today:   EKG:  The ekg ordered today demonstrates sinus rhythm, heart rate 89 bpm  CT cardiac scoring 08/08/2020 ADDENDUM REPORT: 08/08/2021 22:03   CLINICAL DATA:  Cardiovascular Disease Risk stratification   EXAM: Coronary Calcium Score   TECHNIQUE: A gated, non-contrast computed tomography scan of the heart was performed using 11mm slice thickness. Axial images were analyzed on a dedicated workstation. Calcium scoring of the coronary arteries was performed using the Agatston method.   FINDINGS: Coronary arteries: Normal origins.   Coronary Calcium Score:   Left main: 91   Left anterior descending artery: 74   Left circumflex artery: 31   Right coronary artery: 347   Total: 543   Percentile: 80th   Pericardium: Normal.   Ascending Aorta: Mild dilatation measuring 38mm   Non-cardiac: See separate report from [facility].   IMPRESSION: 1. Coronary calcium score of 543. This was 80th percentile for age-, race-, and sex-matched controls.   2.  Mild dilatation of ascending aorta measuring 61mm  Recent Labs: No results found for requested labs within last 8760 hours.  Recent Lipid Panel No results found for: CHOL, TRIG, HDL, CHOLHDL, VLDL, LDLCALC, LDLDIRECT  Physical Exam:    VS:  BP 130/76    Pulse 69    Ht 5\' 10"  (1.778 m)    Wt 259 lb (117.5 kg)    SpO2 96%    BMI 37.16 kg/m     Wt Readings from Last 3 Encounters:  08/10/21 259 lb (117.5 kg)  05/18/20 255 lb (115.7 kg)  09/08/18 261 lb 12.8 oz (118.8 kg)     GEN: Well nourished, well developed in no acute distress HEENT: Normal NECK: No JVD; No carotid bruits LYMPHATICS: No lymphadenopathy CARDIAC: S1S2 noted,RRR, no murmurs, rubs, gallops RESPIRATORY:  Clear to auscultation without rales, wheezing or rhonchi  ABDOMEN: Soft, non-tender, non-distended, +bowel sounds, no guarding. EXTREMITIES: No edema, No cyanosis, no clubbing MUSCULOSKELETAL:  No deformity  SKIN: Warm and  dry NEUROLOGIC:  Alert and oriented x 3, non-focal PSYCHIATRIC:  Normal affect, good insight  ASSESSMENT:    1. Coronary artery calcification seen on CT scan   2. Morbid obesity ([place])   3. OSA (obstructive sleep apnea)   4. Aortic dilatation (HCC)   5. Health education/counseling    PLAN:    We talked about his result from the CT coronary calcium scoring.  I explained to the patient about his result.  We went into details about the different arteries.  He denies any symptoms.  He notes that recently he is actually increase his activities and offer no complaints.  He has been started on aspirin 81 mg daily and Crestor 20 mg a day by his primary care provider I agree with this.  I explained to the patient about symptoms to look out for.  I explained the further next steps for potential testing if indicated.  In terms of his mild dilatation of the proximal ascending aorta.  Repeat testing with the echocardiogram in 1 year.  The patient understands the need to lose weight with diet and exercise. We have discussed specific strategies for this.  Continue use of CPAP.  The patient is in agreement with the above plan. The patient left the office in stable condition.  The patient will follow up in   Medication Adjustments/Labs and Tests Ordered: Current medicines are reviewed at length with the patient today.  Concerns regarding medicines are outlined above.  Orders Placed This Encounter  Procedures   EKG 12-Lead   No orders of the defined types were placed in this encounter.   Patient Instructions  Medication Instructions:  Your physician recommends that you continue on your current medications as directed. Please refer to the Current Medication list given to you today.  *If you need a refill on your cardiac medications before your next appointment, please call your pharmacy*   Lab Work: None If you have labs (blood work) drawn today and your tests are completely normal, you will  receive your results only by: [place] (if you have MyChart) OR A paper copy in the mail If you have any lab test that is abnormal or we need to change your treatment, we will call you to review the results.   Testing/Procedures: None   Follow-Up: At [facility], you and your health needs are our priority.  As part of our continuing mission to provide you with exceptional heart care, we have created designated Provider Care Teams.  These Care Teams include your primary Cardiologist (physician) and Advanced Practice Providers (APPs -  Physician Assistants and Nurse Practitioners) who all work together to provide you with the care you need, when you need it.  We recommend signing up for the patient portal called "MyChart".  Sign up information is provided on this After Visit Summary.  MyChart is used to connect with patients for Virtual Visits (Telemedicine).  Patients are able to view lab/test results, encounter notes, upcoming appointments, etc.  Non-urgent messages can be sent to your provider as well.   To learn more about what you can do with MyChart, go to [email or web address].    Your next appointment:   1 year(s)  The format for your next appointment:   In Person  Provider:   Berniece Salines, DO     Other Instructions     Adopting a Healthy Lifestyle.  Know what a healthy weight is for you (roughly BMI <25) and aim to maintain this   Aim for 7+ servings of fruits and vegetables daily   65-80+ fluid ounces of water or unsweet tea for healthy kidneys   Limit to max 1 drink of alcohol per day; avoid smoking/tobacco   Limit animal fats in diet for cholesterol and heart health - choose grass fed whenever available   Avoid highly processed foods, and foods high in saturated/trans fats   Aim for low stress - take time to unwind and care for your mental health   Aim for 150 min of moderate intensity exercise weekly for heart health, and weights twice weekly for  bone health   Aim for 7-9 hours of sleep daily   When it comes to diets, agreement about the perfect plan isnt easy to find, even among the experts. Experts at the [employer] developed an idea known as the Healthy Eating Plate. Just imagine a plate divided into logical, healthy portions.   The emphasis is on diet quality:   Load up on vegetables and fruits - one-half of your plate:  Aim for color and variety, and remember that potatoes dont count.   Go for whole grains - one-quarter of your plate: Whole wheat, barley, wheat berries, quinoa, oats, brown rice, and foods made with them. If you want pasta, go with whole wheat pasta.   Protein power - one-quarter of your plate: Fish, chicken, beans, and nuts are all healthy, versatile protein sources. Limit red meat.   The diet, however, does go beyond the plate, offering a few other suggestions.   Use healthy plant oils, such as olive, canola, soy, corn, sunflower and peanut. Check the labels, and avoid partially hydrogenated oil, which have unhealthy trans fats.   If youre thirsty, drink water. Coffee and tea are good in moderation, but skip sugary drinks and limit milk and dairy products to one or two daily servings.   The type of carbohydrate in the diet is more important than the amount. Some sources of carbohydrates, such as vegetables, fruits, whole grains, and beans-are healthier than others.   Finally, stay active  Signed, Berniece Salines, DO  08/10/2021 8:47 AM    [facility]

## 2021-08-10 NOTE — Patient Instructions (Signed)

## 2021-12-12 ENCOUNTER — Other Ambulatory Visit: Payer: Self-pay

## 2021-12-12 ENCOUNTER — Encounter: Payer: Self-pay | Admitting: Emergency Medicine

## 2021-12-12 ENCOUNTER — Ambulatory Visit
Admission: EM | Admit: 2021-12-12 | Discharge: 2021-12-12 | Disposition: A | Discharge status: Home / Self Care | Payer: Medicare Other | Attending: Family Medicine | Admitting: Family Medicine

## 2021-12-12 DIAGNOSIS — S29019A Strain of muscle and tendon of unspecified wall of thorax, initial encounter: Secondary | ICD-10-CM

## 2021-12-12 MED ORDER — CYCLOBENZAPRINE HCL 10 MG PO TABS: 10.0000 mg | ORAL_TABLET | Freq: Three times a day (TID) | ORAL | 0 refills | Status: DC | PRN

## 2021-12-12 NOTE — ED Provider Notes (Signed)
[facility]    CSN: [id number] Arrival date & time: 12/12/21  1604      History   Chief Complaint Chief Complaint  Patient presents with   Shoulder Pain    HPI Richard Nolan. is a 66 y.o. male.   Patient presenting today with new onset right upper back pain to the inner shoulder blade the past 2 days after [place].  He states movement makes the pain significantly worse.  The pain does not radiate elsewhere, no known direct injury, swelling, discoloration, rashes, weakness.  Has been trying massage, heat with mild temporary relief and states it does tend to get better as he gets up and moving through the day.    Past Medical History:  Diagnosis Date   OSA (obstructive sleep apnea)    CPAP nightly   Stenosing tenosynovitis of finger of left hand    left index and left middle fingers    Patient Active Problem List   Diagnosis Date Noted   Coronary artery calcification seen on CT scan 08/10/2021   Morbid obesity (HCC) 08/10/2021   Aortic dilatation (HCC) 08/10/2021   Health education/counseling 08/10/2021   Allergic rhinitis 05/19/2017   Acquired trigger finger 05/22/2015   Other disorders of synovium, tendon, and bursa(727.89) 12/14/2012   Disorder of synovium 12/14/2012   OSA (obstructive sleep apnea) 10/22/2010    Past Surgical History:  Procedure Laterality Date   COLONOSCOPY N/A 06/27/2015   Procedure: COLONOSCOPY;  Surgeon: Malissa Hippo, MD;  [facility];  Service: Endoscopy;  Laterality: N/A;  1030   HERNIA REPAIR     TRIGGER FINGER RELEASE Left 05/09/2015   Procedure: RELEASE TRIGGER FINGER/A-1 PULLEY, left index finger and left middle finger;  Surgeon: Cindee Salt, MD;  Location: [facility];  Service: Orthopedics;  Laterality: Left;   UMBILICAL HERNIA REPAIR         Home Medications    Prior to Admission medications   Medication Sig Start Date End Date Taking? Authorizing  Provider  cyclobenzaprine (FLEXERIL) 10 MG tablet Take 1 tablet (10 mg total) by mouth 3 (three) times daily as needed for muscle spasms. Do not drink alcohol or drive while taking this medication.  May cause drowsiness. 12/12/21  Yes Particia Nearing, PA-C  allopurinol (ZYLOPRIM) 100 MG tablet Take 100 mg by mouth daily. 06/18/21   [provider]  ASPIRIN 81 PO Take by mouth daily.    [provider]  rosuvastatin (CRESTOR) 20 MG tablet Take 20 mg by mouth at bedtime. 08/09/21   [provider]    Family History Family History  Problem Relation Age of Onset   Heart disease Father     Social History Social History   Tobacco Use   Smoking status: Never   Smokeless tobacco: Never  Vaping Use   Vaping Use: Never used  Substance Use Topics   Alcohol use: No    Alcohol/week: 0.0 standard drinks of alcohol   Drug use: No     Allergies   Vioxx [rofecoxib]   Review of Systems Review of Systems Per HPI  Physical Exam Triage Vital Signs ED Triage Vitals [12/12/21 1657]  Enc Vitals Group     BP (!) 143/80     Pulse Rate 85     Resp 18     Temp 98.5 F (36.9 C)     Temp Source Oral     SpO2 94 %  Weight      Height      Head Circumference      Peak Flow      Pain Score 0     Pain Loc      Pain Edu?      Excl. in GC?    No data found.  Updated Vital Signs BP (!) 143/80 (BP Location: Right Arm)   Pulse 85   Temp 98.5 F (36.9 C) (Oral)   Resp 18   SpO2 94%   Visual Acuity Right Eye Distance:   Left Eye Distance:   Bilateral Distance:    Right Eye Near:   Left Eye Near:    Bilateral Near:     Physical Exam Vitals and nursing note reviewed.  Constitutional:      Appearance: Normal appearance.  HENT:     Head: Atraumatic.  Eyes:     Extraocular Movements: Extraocular movements intact.     Conjunctiva/sclera: Conjunctivae normal.  Cardiovascular:     Rate and Rhythm: Normal rate and regular rhythm.  Pulmonary:      Effort: Pulmonary effort is normal.     Breath sounds: Normal breath sounds.  Musculoskeletal:        General: Tenderness present. No swelling or deformity. Normal range of motion.     Cervical back: Normal range of motion and neck supple.     Comments: Localized tenderness to palpation and spasm medial scapular border on the right  Skin:    General: Skin is warm and dry.  Neurological:     General: No focal deficit present.     Mental Status: He is oriented to person, place, and time.     Motor: No weakness.     Gait: Gait normal.     Comments: Bilateral upper extremities neurovascular intact  Psychiatric:        Mood and Affect: Mood normal.        Thought Content: Thought content normal.        Judgment: Judgment normal.      UC Treatments / Results  Labs (all labs ordered are listed, but only abnormal results are displayed) Labs Reviewed - No data to display  EKG   Radiology No results found.  Procedures Procedures (including critical care time)  Medications Ordered in UC Medications - No data to display  Initial Impression / Assessment and Plan / UC Course  I have reviewed the triage vital signs and the nursing notes.  Pertinent labs & imaging results that were available during my care of the patient were reviewed by me and considered in my medical decision making (see chart for details).     Treat with Flexeril, over-the-counter pain relievers, heat, stretches, massage.  Return for worsening symptoms.  Final Clinical Impressions(s) / UC Diagnoses   Final diagnoses:  Thoracic myofascial strain, initial encounter     Discharge Instructions      Take over-the-counter pain relievers, use heat, massage, stretches additionally.  May also try lidocaine patches to the area    ED Prescriptions     Medication Sig Dispense Auth. Provider   cyclobenzaprine (FLEXERIL) 10 MG tablet Take 1 tablet (10 mg total) by mouth 3 (three) times daily as needed for muscle  spasms. Do not drink alcohol or drive while taking this medication.  May cause drowsiness. 15 tablet Particia Nearing, [place]      PDMP not reviewed this encounter.   Particia Nearing, [place] 12/12/21 1749

## 2021-12-12 NOTE — ED Triage Notes (Signed)
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

## 2021-12-12 NOTE — Discharge Instructions (Addendum)
Take over-the-counter pain relievers, use heat, massage, stretches additionally.  May also try lidocaine patches to the area

## 2022-06-01 ENCOUNTER — Ambulatory Visit
Admission: EM | Admit: 2022-06-01 | Discharge: 2022-06-01 | Disposition: A | Discharge status: Home / Self Care | Payer: Medicare Other | Attending: Family Medicine | Admitting: Family Medicine

## 2022-06-01 DIAGNOSIS — H66002 Acute suppurative otitis media without spontaneous rupture of ear drum, left ear: Secondary | ICD-10-CM

## 2022-06-01 DIAGNOSIS — J309 Allergic rhinitis, unspecified: Secondary | ICD-10-CM | POA: Diagnosis not present

## 2022-06-01 MED ORDER — AMOXICILLIN-POT CLAVULANATE 875-125 MG PO TABS: 1.0000 | ORAL_TABLET | Freq: Two times a day (BID) | ORAL | 0 refills | Status: DC

## 2022-06-01 NOTE — ED Triage Notes (Signed)
Pt reports he got into a lot of dust. It made him congested, produces yellow mucus fro nose headache, coughing, vomiting, ear fullness which started Tuesday. Took mucinex and sudafed which helped relief some sinus pressure.

## 2022-06-01 NOTE — ED Provider Notes (Signed)
[facility]    CSN: [id number] Arrival date & time: 06/01/22  4098      History   Chief Complaint No chief complaint on file.   HPI Richard Nolan. is a 66 y.o. male.   Patient presenting today with going on a week of nasal congestion, left-sided sinus pain and pressure, left ear pain, muffled hearing, cough, 1 episode of vomiting, body aches.  States just prior to onset of symptoms he had gotten into a lot of dust which he thinks irritated his allergies.  Denies fever, chest pain, shortness of breath, diarrhea.  Trying Mucinex and Sudafed with mild temporary relief of symptoms.    Past Medical History:  Diagnosis Date   OSA (obstructive sleep apnea)    CPAP nightly   Stenosing tenosynovitis of finger of left hand    left index and left middle fingers    Patient Active Problem List   Diagnosis Date Noted   Coronary artery calcification seen on CT scan 08/10/2021   Morbid obesity (HCC) 08/10/2021   Aortic dilatation (HCC) 08/10/2021   Health education/counseling 08/10/2021   Allergic rhinitis 05/19/2017   Acquired trigger finger 05/22/2015   Other disorders of synovium, tendon, and bursa(727.89) 12/14/2012   Disorder of synovium 12/14/2012   OSA (obstructive sleep apnea) 10/22/2010    Past Surgical History:  Procedure Laterality Date   COLONOSCOPY N/A 06/27/2015   Procedure: COLONOSCOPY;  Surgeon: Malissa Hippo, MD;  Location: [facility];  Service: Endoscopy;  Laterality: N/A;  1030   HERNIA REPAIR     TRIGGER FINGER RELEASE Left 05/09/2015   Procedure: RELEASE TRIGGER FINGER/A-1 PULLEY, left index finger and left middle finger;  Surgeon: Cindee Salt, MD;  Location: [facility];  Service: Orthopedics;  Laterality: Left;   UMBILICAL HERNIA REPAIR         Home Medications    Prior to Admission medications   Medication Sig Start Date End Date Taking? Authorizing Provider  amoxicillin-clavulanate (AUGMENTIN) 875-125 MG tablet Take 1  tablet by mouth every 12 (twelve) hours. 06/01/22  Yes Particia Nearing, PA-C  allopurinol (ZYLOPRIM) 100 MG tablet Take 100 mg by mouth daily. 06/18/21   [provider]  ASPIRIN 81 PO Take by mouth daily.    [provider]  cyclobenzaprine (FLEXERIL) 10 MG tablet Take 1 tablet (10 mg total) by mouth 3 (three) times daily as needed for muscle spasms. Do not drink alcohol or drive while taking this medication.  May cause drowsiness. 12/12/21   Particia Nearing, PA-C  rosuvastatin (CRESTOR) 20 MG tablet Take 20 mg by mouth at bedtime. 08/09/21   [provider]    Family History Family History  Problem Relation Age of Onset   Heart disease Father     Social History Social History   Tobacco Use   Smoking status: Never   Smokeless tobacco: Never  Vaping Use   Vaping Use: Never used  Substance Use Topics   Alcohol use: No    Alcohol/week: 0.0 standard drinks of alcohol   Drug use: No     Allergies   Vioxx [rofecoxib]   Review of Systems Review of Systems Per HPI  Physical Exam Triage Vital Signs ED Triage Vitals  Enc Vitals Group     BP 06/01/22 0912 135/80     Pulse Rate 06/01/22 0912 80     Resp 06/01/22 0912 20     Temp 06/01/22 0912 98.3 F (36.8 C)  Temp Source 06/01/22 0912 Oral     SpO2 06/01/22 0912 96 %     Weight --      Height --      Head Circumference --      Peak Flow --      Pain Score 06/01/22 0916 0     Pain Loc --      Pain Edu? --      Excl. in GC? --    No data found.  Updated Vital Signs BP 135/80 (BP Location: Right Arm)   Pulse 80   Temp 98.3 F (36.8 C) (Oral)   Resp 20   SpO2 96%   Visual Acuity Right Eye Distance:   Left Eye Distance:   Bilateral Distance:    Right Eye Near:   Left Eye Near:    Bilateral Near:     Physical Exam Vitals and nursing note reviewed.  Constitutional:      Appearance: He is well-developed.  HENT:     Head: Atraumatic.     Right Ear: Tympanic membrane  and external ear normal.     Left Ear: External ear normal.     Ears:     Comments: Left TM erythematous, edematous    Nose: Congestion present.     Mouth/Throat:     Pharynx: Posterior oropharyngeal erythema present. No oropharyngeal exudate.  Eyes:     Conjunctiva/sclera: Conjunctivae normal.     Pupils: Pupils are equal, round, and reactive to light.  Cardiovascular:     Rate and Rhythm: Normal rate and regular rhythm.  Pulmonary:     Effort: Pulmonary effort is normal. No respiratory distress.     Breath sounds: No wheezing or rales.  Musculoskeletal:        General: Normal range of motion.     Cervical back: Normal range of motion and neck supple.  Lymphadenopathy:     Cervical: No cervical adenopathy.  Skin:    General: Skin is warm and dry.  Neurological:     Mental Status: He is alert and oriented to person, place, and time.  Psychiatric:        Behavior: Behavior normal.      UC Treatments / Results  Labs (all labs ordered are listed, but only abnormal results are displayed) Labs Reviewed - No data to display  EKG   Radiology No results found.  Procedures Procedures (including critical care time)  Medications Ordered in UC Medications - No data to display  Initial Impression / Assessment and Plan / UC Course  I have reviewed the triage vital signs and the nursing notes.  Pertinent labs & imaging results that were available during my care of the patient were reviewed by me and considered in my medical decision making (see chart for details).     With Augmentin for left ear infection and sinusitis, Flonase, Zyrtec, decongestants, sinus rinses.  Return for worsening symptoms.  Final Clinical Impressions(s) / UC Diagnoses   Final diagnoses:  Acute suppurative otitis media of left ear without spontaneous rupture of tympanic membrane, recurrence not specified  Allergic sinusitis     Discharge Instructions      Take Flonase twice daily,  decongestants, use sinus rinses with saline and take the antibiotic for the ear infection.    ED Prescriptions     Medication Sig Dispense Auth. Provider   amoxicillin-clavulanate (AUGMENTIN) 875-125 MG tablet Take 1 tablet by mouth every 12 (twelve) hours. 14 tablet Particia Nearing, [place]  PDMP not reviewed this encounter.   Roosvelt Maser [place], [place] 06/01/22 [phone]

## 2022-06-01 NOTE — Discharge Instructions (Signed)
Take Flonase twice daily, decongestants, use sinus rinses with saline and take the antibiotic for the ear infection.

## 2022-08-01 DIAGNOSIS — I77819 Aortic ectasia, unspecified site: Secondary | ICD-10-CM

## 2022-08-01 HISTORY — DX: Aortic ectasia, unspecified site: I77.819

## 2022-08-15 ENCOUNTER — Ambulatory Visit: Payer: Medicare Other | Attending: Cardiology | Admitting: Cardiology

## 2022-08-15 ENCOUNTER — Encounter: Payer: Self-pay | Admitting: Cardiology

## 2022-08-15 VITALS — BP 128/76 | HR 67 | Ht 69.0 in | Wt 259.4 lb

## 2022-08-15 DIAGNOSIS — Z01812 Encounter for preprocedural laboratory examination: Secondary | ICD-10-CM | POA: Insufficient documentation

## 2022-08-15 DIAGNOSIS — R079 Chest pain, unspecified: Secondary | ICD-10-CM | POA: Insufficient documentation

## 2022-08-15 DIAGNOSIS — G4733 Obstructive sleep apnea (adult) (pediatric): Secondary | ICD-10-CM | POA: Diagnosis present

## 2022-08-15 DIAGNOSIS — I2510 Atherosclerotic heart disease of native coronary artery without angina pectoris: Secondary | ICD-10-CM | POA: Diagnosis not present

## 2022-08-15 DIAGNOSIS — R072 Precordial pain: Secondary | ICD-10-CM | POA: Insufficient documentation

## 2022-08-15 LAB — LIPID PANEL
Chol/HDL Ratio: 2.4 ratio (ref 0.0–5.0)
Cholesterol, Total: 101 mg/dL (ref 100–199)
HDL: 42 mg/dL (ref >39)
LDL Chol Calc (NIH): 42 mg/dL (ref 0–99)
Triglycerides: 87 mg/dL (ref 0–149)
VLDL Cholesterol Cal: 17 mg/dL (ref 5–40)

## 2022-08-15 LAB — BASIC METABOLIC PANEL
BUN/Creatinine Ratio: 11 (ref 10–24)
BUN: 14 mg/dL (ref 8–27)
CO2: 22 mmol/L (ref 20–29)
Calcium: 9.2 mg/dL (ref 8.6–10.2)
Chloride: 104 mmol/L (ref 96–106)
Creatinine, Ser: 1.29 mg/dL — ABNORMAL HIGH (ref 0.76–1.27)
Glucose: 123 mg/dL — ABNORMAL HIGH (ref 70–99)
Potassium: 4.3 mmol/L (ref 3.5–5.2)
Sodium: 140 mmol/L (ref 134–144)
eGFR: 60 mL/min/{1.73_m2} (ref >59)

## 2022-08-15 LAB — MAGNESIUM: Magnesium: 2 mg/dL (ref 1.6–2.3)

## 2022-08-15 MED ORDER — METOPROLOL TARTRATE 100 MG PO TABS: ORAL_TABLET | ORAL | 0 refills | Status: DC

## 2022-08-15 NOTE — Patient Instructions (Signed)
Medication Instructions:  Your physician recommends that you continue on your current medications as directed. Please refer to the Current Medication list given to you today.  *If you need a refill on your cardiac medications before your next appointment, please call your pharmacy*   Lab Work: Your physician recommends that you have labs drawn today: BMET, Mag, Lipids If you have labs (blood work) drawn today and your tests are completely normal, you will receive your results only by: MyChart Message (if you have MyChart) OR A paper copy in the mail If you have any lab test that is abnormal or we need to change your treatment, we will call you to review the results.   Testing/Procedures:   Your cardiac CT will be scheduled at one of the below locations:   [facility] [address] [place], [place] [address] [phone]  If scheduled at [facility], please arrive at the [facility] (Entrance C2) of [facility] 30 minutes prior to test start time. You can use the FREE valet parking offered at entrance C (encouraged to control the heart rate for the test)  Proceed to the [facility] Radiology Department (first floor) to check-in and test prep.  All radiology patients and guests should use entrance C2 at [facility], accessed from [facility], even though the hospital's physical address listed is [address]     Please follow these instructions carefully (unless otherwise directed):  Hold all erectile dysfunction medications at least 3 days (72 hrs) prior to test. (Ie viagra, cialis, sildenafil, tadalafil, etc) We will administer nitroglycerin during this exam.   On the Night Before the Test: Be sure to Drink plenty of water. Do not consume any caffeinated/decaffeinated beverages or chocolate 12 hours prior to your test. Do not take any antihistamines 12 hours prior to your test.  On the Day of  the Test: Drink plenty of water until 1 hour prior to the test. Do not eat any food 1 hour prior to test. You may take your regular medications prior to the test.  Take metoprolol (Lopressor) two hours prior to test. If you take Furosemide/Hydrochlorothiazide/Spironolactone, please HOLD on the morning of the test.      After the Test: Drink plenty of water. After receiving IV contrast, you may experience a mild flushed feeling. This is normal. On occasion, you may experience a mild rash up to 24 hours after the test. This is not dangerous. If this occurs, you can take Benadryl 25 mg and increase your fluid intake. If you experience trouble breathing, this can be serious. If it is severe call 911 IMMEDIATELY. If it is mild, please call our office. If you take any of these medications: Glipizide/Metformin, Avandament, Glucavance, please do not take 48 hours after completing test unless otherwise instructed.  We will call to schedule your test 2-4 weeks out understanding that some insurance companies will need an authorization prior to the service being performed.   For non-scheduling related questions, please contact the cardiac imaging nurse navigator should you have any questions/concerns: Marchia Bond, Cardiac Imaging Nurse Navigator Gordy Clement, Cardiac Imaging Nurse Navigator [facility] Direct Office Dial: [phone]   For scheduling needs, including cancellations and rescheduling, please call [place], [phone].    Follow-Up: At [facility], you and your health needs are our priority.  As part of our continuing mission to provide you with exceptional heart care, we have created  designated Provider Care Teams.  These Care Teams include your primary Cardiologist (physician) and Advanced Practice Providers (APPs -  Physician Assistants and Nurse Practitioners) who all work together to provide you with the care you need, when you need  it.  Your next appointment:   4 month(s)  Provider:   Berniece Salines, DO

## 2022-08-15 NOTE — Progress Notes (Signed)
Cardiology Office Note:    Date:  08/15/2022   ID:  [name], DOB [birth year], MRN [id number]  PCP:  Asencion Noble, MD  Cardiologist:  Berniece Salines, DO  Electrophysiologist:  None   Referring MD: Asencion Noble, MD   " I recently had a scan of the heart"  History of Present Illness:    Richard Nolan. is a 67 y.o. male with a hx of obstructive sleep apnea on CPAP nightly, obesity, recently had a CT cardiac scoring showing coronary calcification with calcium score of 543 placing him at 80th percentile and mildly dilated proximal ascending aorta.  He is here today with his wife.  He tells me that he has been experiencing intermittent chest discomfort.  He notes that he is having chest midsternal chest pain on exertion.  He notes that not every day but whenever he exert himself these episodes occurs.  He describes as a midsternal discomfort lasting for about 30 minutes during this times.  He admits to associated shortness of breath.   Past Medical History:  Diagnosis Date   OSA (obstructive sleep apnea)    CPAP nightly   Stenosing tenosynovitis of finger of left hand    left index and left middle fingers    Past Surgical History:  Procedure Laterality Date   COLONOSCOPY N/A 06/27/2015   Procedure: COLONOSCOPY;  Surgeon: Rogene Houston, MD;  [facility];  Service: Endoscopy;  Laterality: N/A;  [place] Left 05/09/2015   Procedure: RELEASE TRIGGER FINGER/A-1 PULLEY, left index finger and left middle finger;  Surgeon: Daryll Brod, MD;  Location: [facility];  Service: Orthopedics;  Laterality: Left;   UMBILICAL HERNIA REPAIR      Current Medications: Current Meds  Medication Sig   allopurinol (ZYLOPRIM) 100 MG tablet Take 100 mg by mouth daily.   ASPIRIN 81 PO Take by mouth daily.   cyclobenzaprine (FLEXERIL) 10 MG tablet Take 10 mg by mouth 3 (three) times daily as needed for muscle spasms (Knee pain).   metoprolol  tartrate (LOPRESSOR) 100 MG tablet Take 2 hours prior to CT   rosuvastatin (CRESTOR) 20 MG tablet Take 20 mg by mouth at bedtime.     Allergies:   Vioxx [rofecoxib]   Social History   Socioeconomic History   Marital status: Married    Spouse name: Not on file   Number of children: Y   Years of education: Not on file   Highest education level: Not on file  Occupational History   Occupation: truck driver  Tobacco Use   Smoking status: Never   Smokeless tobacco: Never  Vaping Use   Vaping Use: Never used  Substance and Sexual Activity   Alcohol use: No    Alcohol/week: 0.0 standard drinks of alcohol   Drug use: No   Sexual activity: Never  Other Topics Concern   Not on file  Social History Narrative   Not on file   Social Determinants of Health   Financial Resource Strain: Not on file  Food Insecurity: Not on file  Transportation Needs: Not on file  Physical Activity: Not on file  Stress: Not on file  Social Connections: Not on file     Family History: The patient's family history includes Heart disease in his father.  ROS:   Review of Systems  Constitution: Negative for decreased appetite, fever and weight gain.  HENT: Negative for congestion, ear  discharge, hoarse voice and sore throat.   Eyes: Negative for discharge, redness, vision loss in right eye and visual halos.  Cardiovascular: Negative for chest pain, dyspnea on exertion, leg swelling, orthopnea and palpitations.  Respiratory: Negative for cough, hemoptysis, shortness of breath and snoring.   Endocrine: Negative for heat intolerance and polyphagia.  Hematologic/Lymphatic: Negative for bleeding problem. Does not bruise/bleed easily.  Skin: Negative for flushing, nail changes, rash and suspicious lesions.  Musculoskeletal: Negative for arthritis, joint pain, muscle cramps, myalgias, neck pain and stiffness.  Gastrointestinal: Negative for abdominal pain, bowel incontinence, diarrhea and excessive appetite.   Genitourinary: Negative for decreased libido, genital sores and incomplete emptying.  Neurological: Negative for brief paralysis, focal weakness, headaches and loss of balance.  Psychiatric/Behavioral: Negative for altered mental status, depression and suicidal ideas.  Allergic/Immunologic: Negative for HIV exposure and persistent infections.    EKGs/Labs/Other Studies Reviewed:    The following studies were reviewed today:   EKG:  The ekg ordered today demonstrates sinus rhythm, heart rate 67 beats per minute  CT cardiac scoring 08/08/2020 ADDENDUM REPORT: 08/08/2021 22:03   CLINICAL DATA:  Cardiovascular Disease Risk stratification   EXAM: Coronary Calcium Score   TECHNIQUE: A gated, non-contrast computed tomography scan of the heart was performed using 55m slice thickness. Axial images were analyzed on a dedicated workstation. Calcium scoring of the coronary arteries was performed using the Agatston method.   FINDINGS: Coronary arteries: Normal origins.   Coronary Calcium Score:   Left main: 91   Left anterior descending artery: 74   Left circumflex artery: 31   Right coronary artery: 347   Total: 543   Percentile: 80th   Pericardium: Normal.   Ascending Aorta: Mild dilatation measuring 322m  [facility]-cardiac: [facility]   IMPRESSION: 1. Coronary calcium score of 543. This was 80th percentile for age-, race-, and sex-matched controls.   2.  Mild dilatation of ascending aorta measuring 3811mRecent Labs: No results found for requested labs within last 365 days.  Recent Lipid Panel No results found for: "CHOL", "TRIG", "HDL", "CHOLHDL", "VLDL", "LDLCALC", "LDLDIRECT"  Physical Exam:    VS:  BP 128/76   Pulse 67   Ht 5' 9"$  (1.753 m)   Wt 117.7 kg   SpO2 97%   BMI 38.31 kg/m     Wt Readings from Last 3 Encounters:  08/15/22 117.7 kg  08/10/21 117.5 kg  05/18/20 115.7 kg     GEN: Well nourished, well developed  in no acute distress HEENT: Normal NECK: No JVD; No carotid bruits LYMPHATICS: No lymphadenopathy CARDIAC: S1S2 noted,RRR, no murmurs, rubs, gallops RESPIRATORY:  Clear to auscultation without rales, wheezing or rhonchi  ABDOMEN: Soft, non-tender, non-distended, +bowel sounds, no guarding. EXTREMITIES: No edema, No cyanosis, no clubbing MUSCULOSKELETAL:  No deformity  SKIN: Warm and dry NEUROLOGIC:  Alert and oriented x 3, non-focal PSYCHIATRIC:  Normal affect, good insight  ASSESSMENT:    1. Chest pain, unspecified type   2. Pre-procedure lab exam   3. Chest pain, precordial   4. Coronary artery calcification seen on CT scan   5. OSA (obstructive sleep apnea)     PLAN:    The symptoms chest pain is concerning, this patient does have intermediate risk for coronary artery disease and at this time I would like to pursue an ischemic evaluation in this patient.  Shared decision a coronary CTA at this time is appropriate.  I have discussed with the patient about the  testing.  The patient has no IV contrast allergy and is agreeable to proceed with this test.    Continue aspirin 81 mg daily and Crestor 20 mg a day.  Will get blood work today with a lipid profile.  The patient understands the need to lose weight with diet and exercise. We have discussed specific strategies for this.  Continue use of CPAP.  The patient is in agreement with the above plan. The patient left the office in stable condition.  The patient will follow up in   Medication Adjustments/Labs and Tests Ordered: Current medicines are reviewed at length with the patient today.  Concerns regarding medicines are outlined above.  Orders Placed This Encounter  Procedures   CT CORONARY MORPH W/CTA COR W/SCORE W/CA W/CM &/OR WO/CM   Basic metabolic panel   Lipid panel   Magnesium   EKG 12-Lead   Meds ordered this encounter  Medications   metoprolol tartrate (LOPRESSOR) 100 MG tablet    Sig: Take 2 hours prior to CT     Dispense:  1 tablet    Refill:  0    Patient Instructions  Medication Instructions:  Your physician recommends that you continue on your current medications as directed. Please refer to the Current Medication list given to you today.  *If you need a refill on your cardiac medications before your next appointment, please call your pharmacy*   Lab Work: Your physician recommends that you have labs drawn today: BMET, Mag, Lipids If you have labs (blood work) drawn today and your tests are completely normal, you will receive your results only by: MyChart Message (if you have MyChart) OR A paper copy in the mail If you have any lab test that is abnormal or we need to change your treatment, we will call you to review the results.   Testing/Procedures:   Your cardiac CT will be scheduled at one of the below locations:   [facility] [address] [place], [place] [address] [phone]  If scheduled at [facility] please arrive at the [facility] and Children's Entrance (Entrance C2) of [facility] 30 minutes prior to test start time. You can use the FREE valet parking offered at entrance C (encouraged to control the heart rate for the test)  Proceed to the [facility] Radiology Department (first floor) to check-in and test prep.  All radiology patients and guests should use entrance C2 at [facility], accessed from [facility], even though the hospital's physical address listed is [address]     Please follow these instructions carefully (unless otherwise directed):  Hold all erectile dysfunction medications at least 3 days (72 hrs) prior to test. (Ie viagra, cialis, sildenafil, tadalafil, etc) We will administer nitroglycerin during this exam.   On the Night Before the Test: Be sure to Drink plenty of water. Do not consume any caffeinated/decaffeinated beverages or chocolate 12 hours prior to your test. Do not take  any antihistamines 12 hours prior to your test.  On the Day of the Test: Drink plenty of water until 1 hour prior to the test. Do not eat any food 1 hour prior to test. You may take your regular medications prior to the test.  Take metoprolol (Lopressor) two hours prior to test. If you take Furosemide/Hydrochlorothiazide/Spironolactone, please HOLD on the morning of the test.      After the Test: Drink plenty of water. After receiving IV contrast, you may experience a mild  flushed feeling. This is normal. On occasion, you may experience a mild rash up to 24 hours after the test. This is not dangerous. If this occurs, you can take Benadryl 25 mg and increase your fluid intake. If you experience trouble breathing, this can be serious. If it is severe call 911 IMMEDIATELY. If it is mild, please call our office. If you take any of these medications: Glipizide/Metformin, Avandament, Glucavance, please do not take 48 hours after completing test unless otherwise instructed.  We will call to schedule your test 2-4 weeks out understanding that some insurance companies will need an authorization prior to the service being performed.   For non-scheduling related questions, please contact the cardiac imaging nurse navigator should you have any questions/concerns: Marchia Bond, Cardiac Imaging Nurse Navigator Gordy Clement, Cardiac Imaging Nurse Navigator [facility] Direct Office Dial: [phone]   For scheduling needs, including cancellations and rescheduling, please call [place], [phone].    Follow-Up: At [facility], you and your health needs are our priority.  As part of our continuing mission to provide you with exceptional heart care, we have created designated Provider Care Teams.  These Care Teams include your primary Cardiologist (physician) and Advanced Practice Providers (APPs -  Physician Assistants and Nurse Practitioners) who all work  together to provide you with the care you need, when you need it.  Your next appointment:   4 month(s)  Provider:   Berniece Salines, DO    Adopting a Healthy Lifestyle.  Know what a healthy weight is for you (roughly BMI <25) and aim to maintain this   Aim for 7+ servings of fruits and vegetables daily   65-80+ fluid ounces of water or unsweet tea for healthy kidneys   Limit to max 1 drink of alcohol per day; avoid smoking/tobacco   Limit animal fats in diet for cholesterol and heart health - choose grass fed whenever available   Avoid highly processed foods, and foods high in saturated/trans fats   Aim for low stress - take time to unwind and care for your mental health   Aim for 150 min of moderate intensity exercise weekly for heart health, and weights twice weekly for bone health   Aim for 7-9 hours of sleep daily   When it comes to diets, agreement about the perfect plan isnt easy to find, even among the experts. Experts at the [employer] developed an idea known as the Healthy Eating Plate. Just imagine a plate divided into logical, healthy portions.   The emphasis is on diet quality:   Load up on vegetables and fruits - one-half of your plate: Aim for color and variety, and remember that potatoes dont count.   Go for whole grains - one-quarter of your plate: Whole wheat, barley, wheat berries, quinoa, oats, brown rice, and foods made with them. If you want pasta, go with whole wheat pasta.   Protein power - one-quarter of your plate: Fish, chicken, beans, and nuts are all healthy, versatile protein sources. Limit red meat.   The diet, however, does go beyond the plate, offering a few other suggestions.   Use healthy plant oils, such as olive, canola, soy, corn, sunflower and peanut. Check the labels, and avoid partially hydrogenated oil, which have unhealthy trans fats.   If youre thirsty, drink water. Coffee and tea are good in moderation, but  skip sugary drinks and limit milk and dairy products to one or two daily servings.  The type of carbohydrate in the diet is more important than the amount. Some sources of carbohydrates, such as vegetables, fruits, whole grains, and beans-are healthier than others.   Finally, stay active  Signed, Berniece Salines, DO  08/15/2022 8:38 AM    [facility]

## 2022-08-21 ENCOUNTER — Telehealth (HOSPITAL_COMMUNITY): Payer: Self-pay | Admitting: Emergency Medicine

## 2022-08-21 NOTE — Telephone Encounter (Signed)
Reaching out to patient to offer assistance regarding upcoming cardiac imaging study; pt verbalizes understanding of appt date/time, parking situation and where to check in, pre-test NPO status and medications ordered, and verified current allergies; name and call back number provided for further questions should they arise Marchia Bond RN Navigator Cardiac Imaging [facility] [phone] office [phone] cell   Arrival [address] entrance Denies IV issues 139m metoprolol  Aware contrast / nitro

## 2022-08-22 ENCOUNTER — Ambulatory Visit (HOSPITAL_BASED_OUTPATIENT_CLINIC_OR_DEPARTMENT_OTHER)
Admission: RE | Admit: 2022-08-22 | Discharge: 2022-08-22 | Disposition: A | Discharge status: Home / Self Care | Payer: Medicare Other | Source: Ambulatory Visit | Attending: Cardiology | Admitting: Cardiology

## 2022-08-22 ENCOUNTER — Ambulatory Visit (HOSPITAL_COMMUNITY)
Admission: RE | Admit: 2022-08-22 | Discharge: 2022-08-22 | Disposition: A | Discharge status: Home / Self Care | Payer: Medicare Other | Source: Ambulatory Visit | Attending: Cardiology | Admitting: Cardiology

## 2022-08-22 ENCOUNTER — Other Ambulatory Visit: Payer: Self-pay | Admitting: Cardiology

## 2022-08-22 DIAGNOSIS — R931 Abnormal findings on diagnostic imaging of heart and coronary circulation: Secondary | ICD-10-CM

## 2022-08-22 DIAGNOSIS — I2510 Atherosclerotic heart disease of native coronary artery without angina pectoris: Secondary | ICD-10-CM

## 2022-08-22 DIAGNOSIS — R079 Chest pain, unspecified: Secondary | ICD-10-CM | POA: Diagnosis present

## 2022-08-22 DIAGNOSIS — R072 Precordial pain: Secondary | ICD-10-CM | POA: Diagnosis present

## 2022-08-22 MED ORDER — NITROGLYCERIN 0.4 MG SL SUBL
SUBLINGUAL_TABLET | SUBLINGUAL | Status: AC
  Filled 2022-08-22: qty 2

## 2022-08-22 MED ORDER — IOHEXOL 350 MG/ML SOLN
95.0000 mL | Freq: Once | INTRAVENOUS | Status: AC | PRN
  Administered 2022-08-22: 95 mL via INTRAVENOUS

## 2022-08-22 MED ORDER — NITROGLYCERIN 0.4 MG SL SUBL
0.8000 mg | SUBLINGUAL_TABLET | Freq: Once | SUBLINGUAL | Status: AC
  Administered 2022-08-22: 0.8 mg via SUBLINGUAL

## 2022-08-26 ENCOUNTER — Encounter: Payer: Self-pay | Admitting: Cardiology

## 2022-08-26 ENCOUNTER — Ambulatory Visit: Payer: Medicare Other | Attending: Cardiology | Admitting: Cardiology

## 2022-08-26 VITALS — BP 124/60 | HR 81 | Ht 69.0 in | Wt 260.4 lb

## 2022-08-26 DIAGNOSIS — Z79899 Other long term (current) drug therapy: Secondary | ICD-10-CM

## 2022-08-26 DIAGNOSIS — Z01812 Encounter for preprocedural laboratory examination: Secondary | ICD-10-CM

## 2022-08-26 NOTE — Progress Notes (Signed)
Pt will be seen today.

## 2022-08-26 NOTE — Patient Instructions (Signed)
Medication Instructions:  Your physician recommends that you continue on your current medications as directed. Please refer to the Current Medication list given to you today.  *If you need a refill on your cardiac medications before your next appointment, please call your pharmacy*   Lab Work: Your physician recommends that you have labs drawn: BMET, Mag, CBC If you have labs (blood work) drawn today and your tests are completely normal, you will receive your results only by: MyChart Message (if you have MyChart) OR A paper copy in the mail If you have any lab test that is abnormal or we need to change your treatment, we will call you to review the results.   Testing/Procedures:  You are scheduled for a Cardiac Catheterization on Thursday, February 29 with Dr. Lauree Chandler.  1. Please arrive at the [facility] (Main Entrance A) at [facility]: [address] [place], [place] [address] at 5:30 AM (This time is two hours before your procedure to ensure your preparation). Free valet parking service is available.   Special note: Every effort is made to have your procedure done on time. Please understand that emergencies sometimes delay scheduled procedures.  2. Diet: Do not eat solid foods after midnight.  The patient may have clear liquids until 5am upon the day of the procedure.  3. Labs: You will need to have blood drawn   4. Medication instructions in preparation for your procedure:   Contrast Allergy: No  On the morning of your procedure, take your Aspirin 81 mg and any morning medicines NOT listed above.  You may use sips of water.  5. Plan for one night stay--bring personal belongings. 6. Bring a current list of your medications and current insurance cards. 7. You MUST have a responsible person to drive you home. 8. Someone MUST be with you the first 24 hours after you arrive home or your discharge will be delayed. 9. Please wear clothes that are easy to get on  and off and wear slip-on shoes.  Thank you for allowing [place] to care for you!   -- [facility]    Follow-Up: At [facility], you and your health needs are our priority.  As part of our continuing mission to provide you with exceptional heart care, we have created designated Provider Care Teams.  These Care Teams include your primary Cardiologist (physician) and Advanced Practice Providers (APPs -  Physician Assistants and Nurse Practitioners) who all work together to provide you with the care you need, when you need it.   Your next appointment:   4-6 week(s)  Provider:   Berniece Salines, DO

## 2022-08-26 NOTE — Progress Notes (Signed)
Cardiology Office Note:    Date:  08/26/2022   ID:  Richard Allan., DOB Mar 11, 1956, MRN [id number]  PCP:  Asencion Noble, MD  Cardiologist:  Berniece Salines, DO  Electrophysiologist:  None   Referring MD: Asencion Noble, MD   " I am okay"  History of Present Illness:    Richard Silvan. is a 67 y.o. male with a hx of coronary artery disease seen on coronary CTA recently, obstructive sleep apnea on CPAP, obesity here today to discuss his coronary CT scan results.  He still is experiencing some midsternal chest pain on exertion.  No other complaints.  Past Medical History:  Diagnosis Date   OSA (obstructive sleep apnea)    CPAP nightly   Stenosing tenosynovitis of finger of left hand    left index and left middle fingers    Past Surgical History:  Procedure Laterality Date   COLONOSCOPY N/A 06/27/2015   Procedure: COLONOSCOPY;  Surgeon: Rogene Houston, MD;  [facility];  Service: Endoscopy;  Laterality: N/A;  [place] Left 05/09/2015   Procedure: RELEASE TRIGGER FINGER/A-1 PULLEY, left index finger and left middle finger;  Surgeon: Daryll Brod, MD;  Location: [place];  Service: Orthopedics;  Laterality: Left;   UMBILICAL HERNIA REPAIR      Current Medications: Current Meds  Medication Sig   allopurinol (ZYLOPRIM) 100 MG tablet Take 100 mg by mouth daily.   ASPIRIN 81 PO Take by mouth daily.   rosuvastatin (CRESTOR) 20 MG tablet Take 20 mg by mouth at bedtime.     Allergies:   Vioxx [rofecoxib]   Social History   Socioeconomic History   Marital status: Married    Spouse name: Not on file   Number of children: Y   Years of education: Not on file   Highest education level: Not on file  Occupational History   Occupation: truck driver  Tobacco Use   Smoking status: Never   Smokeless tobacco: Never  Vaping Use   Vaping Use: Never used  Substance and Sexual Activity   Alcohol use: No    Alcohol/week: 0.0  standard drinks of alcohol   Drug use: No   Sexual activity: Never  Other Topics Concern   Not on file  Social History Narrative   Not on file   Social Determinants of Health   Financial Resource Strain: Not on file  Food Insecurity: Not on file  Transportation Needs: Not on file  Physical Activity: Not on file  Stress: Not on file  Social Connections: Not on file     Family History: The patient's family history includes Heart disease in his father.  ROS:   Review of Systems  Constitution: Negative for decreased appetite, fever and weight gain.  HENT: Negative for congestion, ear discharge, hoarse voice and sore throat.   Eyes: Negative for discharge, redness, vision loss in right eye and visual halos.  Cardiovascular: Negative for chest pain, dyspnea on exertion, leg swelling, orthopnea and palpitations.  Respiratory: Negative for cough, hemoptysis, shortness of breath and snoring.   Endocrine: Negative for heat intolerance and polyphagia.  Hematologic/Lymphatic: Negative for bleeding problem. Does not bruise/bleed easily.  Skin: Negative for flushing, nail changes, rash and suspicious lesions.  Musculoskeletal: Negative for arthritis, joint pain, muscle cramps, myalgias, neck pain and stiffness.  Gastrointestinal: Negative for abdominal pain, bowel incontinence, diarrhea and excessive appetite.  Genitourinary: Negative for decreased libido,  genital sores and incomplete emptying.  Neurological: Negative for brief paralysis, focal weakness, headaches and loss of balance.  Psychiatric/Behavioral: Negative for altered mental status, depression and suicidal ideas.  Allergic/Immunologic: Negative for HIV exposure and persistent infections.    EKGs/Labs/Other Studies Reviewed:    The following studies were reviewed today:   EKG:  None today  Recent Labs: 08/15/2022: BUN 14; Creatinine, Ser 1.29; Magnesium 2.0; Potassium 4.3; Sodium 140  Recent Lipid Panel    Component  Value Date/Time   CHOL 101 08/15/2022 0846   TRIG 87 08/15/2022 0846   HDL 42 08/15/2022 0846   CHOLHDL 2.4 08/15/2022 0846   LDLCALC 42 08/15/2022 0846    Physical Exam:    VS:  BP 124/60   Pulse 81   Ht '5\' 9"'$  (1.753 m)   Wt 118.1 kg   SpO2 95%   BMI 38.45 kg/m     Wt Readings from Last 3 Encounters:  08/26/22 118.1 kg  08/15/22 117.7 kg  08/10/21 117.5 kg     GEN: Well nourished, well developed in no acute distress HEENT: Normal NECK: No JVD; No carotid bruits LYMPHATICS: No lymphadenopathy CARDIAC: S1S2 noted,RRR, no murmurs, rubs, gallops RESPIRATORY:  Clear to auscultation without rales, wheezing or rhonchi  ABDOMEN: Soft, non-tender, non-distended, +bowel sounds, no guarding. EXTREMITIES: No edema, No cyanosis, no clubbing MUSCULOSKELETAL:  No deformity  SKIN: Warm and dry NEUROLOGIC:  Alert and oriented x 3, non-focal PSYCHIATRIC:  Normal affect, good insight  ASSESSMENT:    1. Pre-procedure lab exam   2. Medication management    PLAN:    CAD-I discussed his coronary CT scan with him which show evidence of coronary artery disease with flow-limiting lesions.  Next best step is to move forward with a left heart catheterization.  He is agreeable. The patient understands that risks include but are not limited to stroke (1 in 1000), death (1 in 59), kidney failure [usually temporary] (1 in 500), bleeding (1 in 200), allergic reaction [possibly serious] (1 in 200), and agrees to proceed.  He is on aspirin 81 mg daily as well as statin.  I reviewed his most recent lipid profile showing his LDL at 42.  Hyperlipidemia - continue with current statin medication.  Prediabetes-lifestyle modification recommended.  The patient understands the need to lose weight with diet and exercise. We have discussed specific strategies for this.  Get blood work today.  The patient is in agreement with the above plan. The patient left the office in stable condition.  The patient  will follow up in   Medication Adjustments/Labs and Tests Ordered: Current medicines are reviewed at length with the patient today.  Concerns regarding medicines are outlined above.  Orders Placed This Encounter  Procedures   Basic Metabolic Panel (BMET)   Magnesium   CBC with Differential/Platelet   No orders of the defined types were placed in this encounter.   Patient Instructions  Medication Instructions:  Your physician recommends that you continue on your current medications as directed. Please refer to the Current Medication list given to you today.  *If you need a refill on your cardiac medications before your next appointment, please call your pharmacy*   Lab Work: Your physician recommends that you have labs drawn: BMET, Mag, CBC If you have labs (blood work) drawn today and your tests are completely normal, you will receive your results only by: [place] (if you have MyChart) OR A paper copy in the mail If you have any lab  test that is abnormal or we need to change your treatment, we will call you to review the results.   Testing/Procedures:  You are scheduled for a Cardiac Catheterization on Thursday, February 29 with Dr. Lauree Chandler.  1. Please arrive at the [facility] (Main Entrance A) at [facility]: [address] [place], [place] [address] at 5:30 AM (This time is two hours before your procedure to ensure your preparation). Free valet parking service is available.   Special note: Every effort is made to have your procedure done on time. Please understand that emergencies sometimes delay scheduled procedures.  2. Diet: Do not eat solid foods after midnight.  The patient may have clear liquids until 5am upon the day of the procedure.  3. Labs: You will need to have blood drawn   4. Medication instructions in preparation for your procedure:   Contrast Allergy: No  On the morning of your procedure, take your Aspirin 81 mg and any  morning medicines NOT listed above.  You may use sips of water.  5. Plan for one night stay--bring personal belongings. 6. Bring a current list of your medications and current insurance cards. 7. You MUST have a responsible person to drive you home. 8. Someone MUST be with you the first 24 hours after you arrive home or your discharge will be delayed. 9. Please wear clothes that are easy to get on and off and wear slip-on shoes.  Thank you for allowing [place] to care for you!   -- [facility]    Follow-Up: At [facility], you and your health needs are our priority.  As part of our continuing mission to provide you with exceptional heart care, we have created designated Provider Care Teams.  These Care Teams include your primary Cardiologist (physician) and Advanced Practice Providers (APPs -  Physician Assistants and Nurse Practitioners) who all work together to provide you with the care you need, when you need it.   Your next appointment:   4-6 week(s)  Provider:   Berniece Salines, DO      Adopting a Healthy Lifestyle.  Know what a healthy weight is for you (roughly BMI <25) and aim to maintain this   Aim for 7+ servings of fruits and vegetables daily   65-80+ fluid ounces of water or unsweet tea for healthy kidneys   Limit to max 1 drink of alcohol per day; avoid smoking/tobacco   Limit animal fats in diet for cholesterol and heart health - choose grass fed whenever available   Avoid highly processed foods, and foods high in saturated/trans fats   Aim for low stress - take time to unwind and care for your mental health   Aim for 150 min of moderate intensity exercise weekly for heart health, and weights twice weekly for bone health   Aim for 7-9 hours of sleep daily   When it comes to diets, agreement about the perfect plan isnt easy to find, even among the experts. Experts at the [employer] developed an idea known  as the Healthy Eating Plate. Just imagine a plate divided into logical, healthy portions.   The emphasis is on diet quality:   Load up on vegetables and fruits - one-half of your plate: Aim for color and variety, and remember that potatoes dont count.   Go for whole grains - one-quarter of your plate: Whole wheat, barley, wheat berries, quinoa, oats, brown rice, and foods made with  them. If you want pasta, go with whole wheat pasta.   Protein power - one-quarter of your plate: Fish, chicken, beans, and nuts are all healthy, versatile protein sources. Limit red meat.   The diet, however, does go beyond the plate, offering a few other suggestions.   Use healthy plant oils, such as olive, canola, soy, corn, sunflower and peanut. Check the labels, and avoid partially hydrogenated oil, which have unhealthy trans fats.   If youre thirsty, drink water. Coffee and tea are good in moderation, but skip sugary drinks and limit milk and dairy products to one or two daily servings.   The type of carbohydrate in the diet is more important than the amount. Some sources of carbohydrates, such as vegetables, fruits, whole grains, and beans-are healthier than others.   Finally, stay active  Richard Pancake, DO  08/26/2022 4:57 PM    [facility]

## 2022-08-26 NOTE — H&P (View-Only) (Signed)
Cardiology Office Note:    Date:  08/26/2022   ID:  Richard Allan., DOB [birth year], MRN [id number]  PCP:  Richard Noble, MD  Cardiologist:  Richard Salines, DO  Electrophysiologist:  None   Referring MD: Richard Noble, MD   " I am okay"  History of Present Illness:    Richard Mclane. is a 67 y.o. male with a hx of coronary artery disease seen on coronary CTA recently, obstructive sleep apnea on CPAP, obesity here today to discuss his coronary CT scan results.  He still is experiencing some midsternal chest pain on exertion.  No other complaints.  Past Medical History:  Diagnosis Date   OSA (obstructive sleep apnea)    CPAP nightly   Stenosing tenosynovitis of finger of left hand    left index and left middle fingers    Past Surgical History:  Procedure Laterality Date   COLONOSCOPY N/A 06/27/2015   Procedure: COLONOSCOPY;  Surgeon: Rogene Houston, MD;  [facility];  Service: Endoscopy;  Laterality: N/A;  [place] Left 05/09/2015   Procedure: RELEASE TRIGGER FINGER/A-1 PULLEY, left index finger and left middle finger;  Surgeon: Daryll Brod, MD;  Location: [facility];  Service: Orthopedics;  Laterality: Left;   UMBILICAL HERNIA REPAIR      Current Medications: Current Meds  Medication Sig   allopurinol (ZYLOPRIM) 100 MG tablet Take 100 mg by mouth daily.   ASPIRIN 81 PO Take by mouth daily.   rosuvastatin (CRESTOR) 20 MG tablet Take 20 mg by mouth at bedtime.     Allergies:   Vioxx [rofecoxib]   Social History   Socioeconomic History   Marital status: Married    Spouse name: Not on file   Number of children: Y   Years of education: Not on file   Highest education level: Not on file  Occupational History   Occupation: truck driver  Tobacco Use   Smoking status: Never   Smokeless tobacco: Never  Vaping Use   Vaping Use: Never used  Substance and Sexual Activity   Alcohol use: No    Alcohol/week: 0.0  standard drinks of alcohol   Drug use: No   Sexual activity: Never  Other Topics Concern   Not on file  Social History Narrative   Not on file   Social Determinants of Health   Financial Resource Strain: Not on file  Food Insecurity: Not on file  Transportation Needs: Not on file  Physical Activity: Not on file  Stress: Not on file  Social Connections: Not on file     Family History: The patient's family history includes Heart disease in his father.  ROS:   Review of Systems  Constitution: Negative for decreased appetite, fever and weight gain.  HENT: Negative for congestion, ear discharge, hoarse voice and sore throat.   Eyes: Negative for discharge, redness, vision loss in right eye and visual halos.  Cardiovascular: Negative for chest pain, dyspnea on exertion, leg swelling, orthopnea and palpitations.  Respiratory: Negative for cough, hemoptysis, shortness of breath and snoring.   Endocrine: Negative for heat intolerance and polyphagia.  Hematologic/Lymphatic: Negative for bleeding problem. Does not bruise/bleed easily.  Skin: Negative for flushing, nail changes, rash and suspicious lesions.  Musculoskeletal: Negative for arthritis, joint pain, muscle cramps, myalgias, neck pain and stiffness.  Gastrointestinal: Negative for abdominal pain, bowel incontinence, diarrhea and excessive appetite.  Genitourinary: Negative for decreased libido,  genital sores and incomplete emptying.  Neurological: Negative for brief paralysis, focal weakness, headaches and loss of balance.  Psychiatric/Behavioral: Negative for altered mental status, depression and suicidal ideas.  Allergic/Immunologic: Negative for HIV exposure and persistent infections.    EKGs/Labs/Other Studies Reviewed:    The following studies were reviewed today:   EKG:  None today  Recent Labs: 08/15/2022: BUN 14; Creatinine, Ser 1.29; Magnesium 2.0; Potassium 4.3; Sodium 140  Recent Lipid Panel    Component  Value Date/Time   CHOL 101 08/15/2022 0846   TRIG 87 08/15/2022 0846   HDL 42 08/15/2022 0846   CHOLHDL 2.4 08/15/2022 0846   LDLCALC 42 08/15/2022 0846    Physical Exam:    VS:  BP 124/60   Pulse 81   Ht '5\' 9"'$  (1.753 m)   Wt 118.1 kg   SpO2 95%   BMI 38.45 kg/m     Wt Readings from Last 3 Encounters:  08/26/22 118.1 kg  08/15/22 117.7 kg  08/10/21 117.5 kg     GEN: Well nourished, well developed in no acute distress HEENT: Normal NECK: No JVD; No carotid bruits LYMPHATICS: No lymphadenopathy CARDIAC: S1S2 noted,RRR, no murmurs, rubs, gallops RESPIRATORY:  Clear to auscultation without rales, wheezing or rhonchi  ABDOMEN: Soft, non-tender, non-distended, +bowel sounds, no guarding. EXTREMITIES: No edema, No cyanosis, no clubbing MUSCULOSKELETAL:  No deformity  SKIN: Warm and dry NEUROLOGIC:  Alert and oriented x 3, non-focal PSYCHIATRIC:  Normal affect, good insight  ASSESSMENT:    1. Pre-procedure lab exam   2. Medication management    PLAN:    CAD-I discussed his coronary CT scan with him which show evidence of coronary artery disease with flow-limiting lesions.  Next best step is to move forward with a left heart catheterization.  He is agreeable. The patient understands that risks include but are not limited to stroke (1 in 1000), death (1 in 17), kidney failure [usually temporary] (1 in 500), bleeding (1 in 200), allergic reaction [possibly serious] (1 in 200), and agrees to proceed.  He is on aspirin 81 mg daily as well as statin.  I reviewed his most recent lipid profile showing his LDL at 42.  Hyperlipidemia - continue with current statin medication.  Prediabetes-lifestyle modification recommended.  The patient understands the need to lose weight with diet and exercise. We have discussed specific strategies for this.  Get blood work today.  The patient is in agreement with the above plan. The patient left the office in stable condition.  The patient  will follow up in   Medication Adjustments/Labs and Tests Ordered: Current medicines are reviewed at length with the patient today.  Concerns regarding medicines are outlined above.  Orders Placed This Encounter  Procedures   Basic Metabolic Panel (BMET)   Magnesium   CBC with Differential/Platelet   No orders of the defined types were placed in this encounter.   Patient Instructions  Medication Instructions:  Your physician recommends that you continue on your current medications as directed. Please refer to the Current Medication list given to you today.  *If you need a refill on your cardiac medications before your next appointment, please call your pharmacy*   Lab Work: Your physician recommends that you have labs drawn: BMET, Mag, CBC If you have labs (blood work) drawn today and your tests are completely normal, you will receive your results only by: [place] (if you have MyChart) OR A paper copy in the mail If you have any lab  test that is abnormal or we need to change your treatment, we will call you to review the results.   Testing/Procedures:  You are scheduled for a Cardiac Catheterization on Thursday, February 29 with Richard Nolan.  1. Please arrive at the [facility] (Main Entrance A) at [facility]: [address] [place], [place] [address] at 5:30 AM (This time is two hours before your procedure to ensure your preparation). Free valet parking service is available.   Special note: Every effort is made to have your procedure done on time. Please understand that emergencies sometimes delay scheduled procedures.  2. Diet: Do not eat solid foods after midnight.  The patient may have clear liquids until 5am upon the day of the procedure.  3. Labs: You will need to have blood drawn   4. Medication instructions in preparation for your procedure:   Contrast Allergy: No  On the morning of your procedure, take your Aspirin 81 mg and any  morning medicines NOT listed above.  You may use sips of water.  5. Plan for one night stay--bring personal belongings. 6. Bring a current list of your medications and current insurance cards. 7. You MUST have a responsible person to drive you home. 8. Someone MUST be with you the first 24 hours after you arrive home or your discharge will be delayed. 9. Please wear clothes that are easy to get on and off and wear slip-on shoes.  Thank you for allowing [place] to care for you!   -- [place] [facility]    Follow-Up: At [facility], you and your health needs are our priority.  As part of our continuing mission to provide you with exceptional heart care, we have created designated Provider Care Teams.  These Care Teams include your primary Cardiologist (physician) and Advanced Practice Providers (APPs -  Physician Assistants and Nurse Practitioners) who all work together to provide you with the care you need, when you need it.   Your next appointment:   4-6 week(s)  Provider:   Berniece Salines, DO      Adopting a Healthy Lifestyle.  Know what a healthy weight is for you (roughly BMI <25) and aim to maintain this   Aim for 7+ servings of fruits and vegetables daily   65-80+ fluid ounces of water or unsweet tea for healthy kidneys   Limit to max 1 drink of alcohol per day; avoid smoking/tobacco   Limit animal fats in diet for cholesterol and heart health - choose grass fed whenever available   Avoid highly processed foods, and foods high in saturated/trans fats   Aim for low stress - take time to unwind and care for your mental health   Aim for 150 min of moderate intensity exercise weekly for heart health, and weights twice weekly for bone health   Aim for 7-9 hours of sleep daily   When it comes to diets, agreement about the perfect plan isnt easy to find, even among the experts. Experts at the [employer] developed an idea known  as the Healthy Eating Plate. Just imagine a plate divided into logical, healthy portions.   The emphasis is on diet quality:   Load up on vegetables and fruits - one-half of your plate: Aim for color and variety, and remember that potatoes dont count.   Go for whole grains - one-quarter of your plate: Whole wheat, barley, wheat berries, quinoa, oats, brown rice, and foods made with  them. If you want pasta, go with whole wheat pasta.   Protein power - one-quarter of your plate: Fish, chicken, beans, and nuts are all healthy, versatile protein sources. Limit red meat.   The diet, however, does go beyond the plate, offering a few other suggestions.   Use healthy plant oils, such as olive, canola, soy, corn, sunflower and peanut. Check the labels, and avoid partially hydrogenated oil, which have unhealthy trans fats.   If youre thirsty, drink water. Coffee and tea are good in moderation, but skip sugary drinks and limit milk and dairy products to one or two daily servings.   The type of carbohydrate in the diet is more important than the amount. Some sources of carbohydrates, such as vegetables, fruits, whole grains, and beans-are healthier than others.   Finally, stay active  Rolly Pancake, DO  08/26/2022 4:57 PM    [facility]

## 2022-08-27 ENCOUNTER — Telehealth: Payer: Self-pay | Admitting: *Deleted

## 2022-08-27 NOTE — Telephone Encounter (Signed)
Cardiac Catheterization scheduled at [facility] for: Thursday August 29, 2022 7:30 AM Arrival time and place: [place] Entrance A at: 5:30 AM  Nothing to eat after midnight prior to procedure, clear liquids until 5 AM day of procedure.  Medication instructions: -Usual morning medications can be taken with sips of water including aspirin 81 mg.  Confirmed patient has responsible adult to drive home post procedure and be with patient first 24 hours after arriving home.  Patient reports no new symptoms concerning for COVID-19 in the past 10 days.  Reviewed procedure instructions with patient.

## 2022-08-28 LAB — CBC WITH DIFFERENTIAL/PLATELET
Basophils Absolute: 0 10*3/uL (ref 0.0–0.2)
Basos: 0 %
EOS (ABSOLUTE): 0.3 10*3/uL (ref 0.0–0.4)
Eos: 3 %
Hematocrit: 50.1 % (ref 37.5–51.0)
Hemoglobin: 16.8 g/dL (ref 13.0–17.7)
Immature Grans (Abs): 0.1 10*3/uL (ref 0.0–0.1)
Immature Granulocytes: 1 %
Lymphocytes Absolute: 2.8 10*3/uL (ref 0.7–3.1)
Lymphs: 31 %
MCH: 30.8 pg (ref 26.6–33.0)
MCHC: 33.5 g/dL (ref 31.5–35.7)
MCV: 92 fL (ref 79–97)
Monocytes Absolute: 0.7 10*3/uL (ref 0.1–0.9)
Monocytes: 8 %
Neutrophils Absolute: 5.1 10*3/uL (ref 1.4–7.0)
Neutrophils: 57 %
Platelets: 225 10*3/uL (ref 150–450)
RBC: 5.46 x10E6/uL (ref 4.14–5.80)
RDW: 13.5 % (ref 11.6–15.4)
WBC: 9 10*3/uL (ref 3.4–10.8)

## 2022-08-28 LAB — BASIC METABOLIC PANEL
BUN/Creatinine Ratio: 9 — ABNORMAL LOW (ref 10–24)
BUN: 14 mg/dL (ref 8–27)
CO2: 22 mmol/L (ref 20–29)
Calcium: 9.6 mg/dL (ref 8.6–10.2)
Chloride: 102 mmol/L (ref 96–106)
Creatinine, Ser: 1.51 mg/dL — ABNORMAL HIGH (ref 0.76–1.27)
Glucose: 107 mg/dL — ABNORMAL HIGH (ref 70–99)
Potassium: 4.6 mmol/L (ref 3.5–5.2)
Sodium: 138 mmol/L (ref 134–144)
eGFR: 50 mL/min/{1.73_m2} — ABNORMAL LOW (ref >59)

## 2022-08-28 LAB — MAGNESIUM: Magnesium: 1.9 mg/dL (ref 1.6–2.3)

## 2022-08-29 ENCOUNTER — Encounter: Payer: Self-pay | Admitting: Radiology

## 2022-08-29 ENCOUNTER — Other Ambulatory Visit (HOSPITAL_COMMUNITY): Payer: Self-pay

## 2022-08-29 ENCOUNTER — Encounter (HOSPITAL_COMMUNITY)
Admission: RE | Disposition: A | Discharge status: Home / Self Care | Payer: Self-pay | Source: Home / Self Care | Attending: Cardiovascular Disease

## 2022-08-29 ENCOUNTER — Ambulatory Visit (HOSPITAL_COMMUNITY)
Admission: RE | Admit: 2022-08-29 | Discharge: 2022-08-29 | Disposition: A | Discharge status: Home / Self Care | Payer: Medicare Other | Attending: Cardiovascular Disease | Admitting: Cardiovascular Disease

## 2022-08-29 DIAGNOSIS — I10 Essential (primary) hypertension: Secondary | ICD-10-CM | POA: Diagnosis not present

## 2022-08-29 DIAGNOSIS — E785 Hyperlipidemia, unspecified: Secondary | ICD-10-CM | POA: Diagnosis not present

## 2022-08-29 DIAGNOSIS — R7303 Prediabetes: Secondary | ICD-10-CM | POA: Insufficient documentation

## 2022-08-29 DIAGNOSIS — Z955 Presence of coronary angioplasty implant and graft: Secondary | ICD-10-CM | POA: Diagnosis not present

## 2022-08-29 DIAGNOSIS — I2510 Atherosclerotic heart disease of native coronary artery without angina pectoris: Secondary | ICD-10-CM

## 2022-08-29 DIAGNOSIS — I25110 Atherosclerotic heart disease of native coronary artery with unstable angina pectoris: Secondary | ICD-10-CM | POA: Diagnosis present

## 2022-08-29 HISTORY — PX: CORONARY STENT INTERVENTION: CATH118234

## 2022-08-29 HISTORY — PX: LEFT HEART CATH AND CORONARY ANGIOGRAPHY: CATH118249

## 2022-08-29 HISTORY — DX: Atherosclerotic heart disease of native coronary artery without angina pectoris: I25.10

## 2022-08-29 LAB — BASIC METABOLIC PANEL
Anion gap: 10 (ref 5–15)
BUN: 16 mg/dL (ref 8–23)
CO2: 23 mmol/L (ref 22–32)
Calcium: 8.9 mg/dL (ref 8.9–10.3)
Chloride: 105 mmol/L (ref 98–111)
Creatinine, Ser: 1.47 mg/dL — ABNORMAL HIGH (ref 0.61–1.24)
GFR, Estimated: 52 mL/min — ABNORMAL LOW (ref >60)
Glucose, Bld: 111 mg/dL — ABNORMAL HIGH (ref 70–99)
Potassium: 3.9 mmol/L (ref 3.5–5.1)
Sodium: 138 mmol/L (ref 135–145)

## 2022-08-29 LAB — POCT ACTIVATED CLOTTING TIME: Activated Clotting Time: 331 seconds

## 2022-08-29 SURGERY — LEFT HEART CATH AND CORONARY ANGIOGRAPHY
Anesthesia: LOCAL

## 2022-08-29 MED ORDER — SODIUM CHLORIDE 0.9 % IV SOLN: 250.0000 mL | INTRAVENOUS | Status: DC | PRN

## 2022-08-29 MED ORDER — CLOPIDOGREL BISULFATE 300 MG PO TABS
ORAL_TABLET | ORAL | Status: AC
  Filled 2022-08-29: qty 2

## 2022-08-29 MED ORDER — HEPARIN SODIUM (PORCINE) 1000 UNIT/ML IJ SOLN
INTRAMUSCULAR | Status: AC
  Filled 2022-08-29: qty 10

## 2022-08-29 MED ORDER — HYDRALAZINE HCL 20 MG/ML IJ SOLN: 10.0000 mg | INTRAMUSCULAR | Status: DC | PRN

## 2022-08-29 MED ORDER — SODIUM CHLORIDE 0.9 % WEIGHT BASED INFUSION: 1.0000 mL/kg/h | INTRAVENOUS | Status: DC

## 2022-08-29 MED ORDER — CLOPIDOGREL BISULFATE 75 MG PO TABS
75.0000 mg | ORAL_TABLET | Freq: Every day | ORAL | 3 refills | Status: DC
  Filled 2022-08-29: qty 90, 90d supply, fill #0
  Filled 2022-08-29: qty 30, 30d supply, fill #0

## 2022-08-29 MED ORDER — HEPARIN SODIUM (PORCINE) 1000 UNIT/ML IJ SOLN
INTRAMUSCULAR | Status: DC | PRN
  Administered 2022-08-29 (×2): 6000 [IU] via INTRAVENOUS

## 2022-08-29 MED ORDER — FAMOTIDINE IN NACL 20-0.9 MG/50ML-% IV SOLN
INTRAVENOUS | Status: AC
  Filled 2022-08-29: qty 50

## 2022-08-29 MED ORDER — CLOPIDOGREL BISULFATE 75 MG PO TABS: 75.0000 mg | ORAL_TABLET | Freq: Every day | ORAL | Status: DC

## 2022-08-29 MED ORDER — NITROGLYCERIN 0.4 MG SL SUBL
0.4000 mg | SUBLINGUAL_TABLET | SUBLINGUAL | 1 refills | Status: DC | PRN
  Filled 2022-08-29: qty 25, 7d supply, fill #0

## 2022-08-29 MED ORDER — SODIUM CHLORIDE 0.9 % WEIGHT BASED INFUSION
3.0000 mL/kg/h | INTRAVENOUS | Status: AC
  Administered 2022-08-29: 3 mL/kg/h via INTRAVENOUS

## 2022-08-29 MED ORDER — SODIUM CHLORIDE 0.9% FLUSH: 3.0000 mL | INTRAVENOUS | Status: DC | PRN

## 2022-08-29 MED ORDER — ONDANSETRON HCL 4 MG/2ML IJ SOLN: 4.0000 mg | Freq: Four times a day (QID) | INTRAMUSCULAR | Status: DC | PRN

## 2022-08-29 MED ORDER — VERAPAMIL HCL 2.5 MG/ML IV SOLN
INTRAVENOUS | Status: DC | PRN
  Administered 2022-08-29: 10 mL via INTRA_ARTERIAL

## 2022-08-29 MED ORDER — FENTANYL CITRATE (PF) 100 MCG/2ML IJ SOLN
INTRAMUSCULAR | Status: AC
  Filled 2022-08-29: qty 2

## 2022-08-29 MED ORDER — FAMOTIDINE IN NACL 20-0.9 MG/50ML-% IV SOLN
INTRAVENOUS | Status: DC | PRN
  Administered 2022-08-29: 20 mg via INTRAVENOUS

## 2022-08-29 MED ORDER — SODIUM CHLORIDE 0.9% FLUSH: 3.0000 mL | Freq: Two times a day (BID) | INTRAVENOUS | Status: DC

## 2022-08-29 MED ORDER — HEPARIN (PORCINE) IN NACL 1000-0.9 UT/500ML-% IV SOLN
INTRAVENOUS | Status: DC | PRN
  Administered 2022-08-29 (×2): 500 mL

## 2022-08-29 MED ORDER — LIDOCAINE HCL (PF) 1 % IJ SOLN
INTRAMUSCULAR | Status: DC | PRN
  Administered 2022-08-29: 2 mL via INTRADERMAL

## 2022-08-29 MED ORDER — MIDAZOLAM HCL 2 MG/2ML IJ SOLN
INTRAMUSCULAR | Status: DC | PRN
  Administered 2022-08-29: 2 mg via INTRAVENOUS
  Administered 2022-08-29: 1 mg via INTRAVENOUS

## 2022-08-29 MED ORDER — ACETAMINOPHEN 325 MG PO TABS: 650.0000 mg | ORAL_TABLET | ORAL | Status: DC | PRN

## 2022-08-29 MED ORDER — LIDOCAINE HCL (PF) 1 % IJ SOLN
INTRAMUSCULAR | Status: AC
  Filled 2022-08-29: qty 30

## 2022-08-29 MED ORDER — ALUM & MAG HYDROXIDE-SIMETH 200-200-20 MG/5ML PO SUSP
ORAL | Status: DC | PRN
  Administered 2022-08-29: 30 mL via ORAL

## 2022-08-29 MED ORDER — CLOPIDOGREL BISULFATE 300 MG PO TABS
ORAL_TABLET | ORAL | Status: DC | PRN
  Administered 2022-08-29: 600 mg via ORAL

## 2022-08-29 MED ORDER — ASPIRIN 81 MG PO TBEC: 81.0000 mg | DELAYED_RELEASE_TABLET | Freq: Every morning | ORAL | Status: DC

## 2022-08-29 MED ORDER — LABETALOL HCL 5 MG/ML IV SOLN: 10.0000 mg | INTRAVENOUS | Status: DC | PRN

## 2022-08-29 MED ORDER — MIDAZOLAM HCL 2 MG/2ML IJ SOLN
INTRAMUSCULAR | Status: AC
  Filled 2022-08-29: qty 2

## 2022-08-29 MED ORDER — SODIUM CHLORIDE 0.9 % IV SOLN: INTRAVENOUS | Status: DC

## 2022-08-29 MED ORDER — METOPROLOL SUCCINATE ER 25 MG PO TB24
25.0000 mg | ORAL_TABLET | Freq: Every day | ORAL | 11 refills | Status: DC
  Filled 2022-08-29: qty 30, 30d supply, fill #0

## 2022-08-29 MED ORDER — ALUM & MAG HYDROXIDE-SIMETH 200-200-20 MG/5ML PO SUSP
ORAL | Status: AC
  Filled 2022-08-29: qty 30

## 2022-08-29 MED ORDER — IOHEXOL 350 MG/ML SOLN
INTRAVENOUS | Status: DC | PRN
  Administered 2022-08-29: 140 mL

## 2022-08-29 MED ORDER — ROSUVASTATIN CALCIUM 20 MG PO TABS: 20.0000 mg | ORAL_TABLET | Freq: Every day | ORAL | Status: DC

## 2022-08-29 MED ORDER — ALLOPURINOL 100 MG PO TABS: 100.0000 mg | ORAL_TABLET | Freq: Every morning | ORAL | Status: DC

## 2022-08-29 MED ORDER — NITROGLYCERIN 1 MG/10 ML FOR IR/CATH LAB
INTRA_ARTERIAL | Status: AC
  Filled 2022-08-29: qty 10

## 2022-08-29 MED ORDER — VERAPAMIL HCL 2.5 MG/ML IV SOLN
INTRAVENOUS | Status: AC
  Filled 2022-08-29: qty 2

## 2022-08-29 MED ORDER — FENTANYL CITRATE (PF) 100 MCG/2ML IJ SOLN
INTRAMUSCULAR | Status: DC | PRN
  Administered 2022-08-29: 25 ug via INTRAVENOUS
  Administered 2022-08-29: 50 ug via INTRAVENOUS

## 2022-08-29 SURGICAL SUPPLY — 18 items
BALLN EMERGE MR 2.0X12 (BALLOONS) ×1
BALLOON EMERGE MR 2.0X12 (BALLOONS) IMPLANT
BAND CMPR LRG ZPHR (HEMOSTASIS) ×1
BAND ZEPHYR COMPRESS 30 LONG (HEMOSTASIS) IMPLANT
CATH 5FR JL3.5 JR4 ANG PIG MP (CATHETERS) IMPLANT
CATH VISTA GUIDE 6FR XBLAD3.5 (CATHETERS) IMPLANT
DEVICE RAD COMP TR BAND LRG (VASCULAR PRODUCTS) IMPLANT
GLIDESHEATH SLEND SS 6F .021 (SHEATH) IMPLANT
GUIDEWIRE INQWIRE 1.5J.035X260 (WIRE) IMPLANT
INQWIRE 1.5J .035X260CM (WIRE) ×1
KIT ENCORE 26 ADVANTAGE (KITS) IMPLANT
KIT HEART LEFT (KITS) ×1 IMPLANT
PACK CARDIAC CATHETERIZATION (CUSTOM PROCEDURE TRAY) ×1 IMPLANT
STENT ONYX FRONTIER 2.25X12 (Permanent Stent) IMPLANT
SYR MEDRAD MARK 7 150ML (SYRINGE) ×1 IMPLANT
TRANSDUCER W/STOPCOCK (MISCELLANEOUS) ×1 IMPLANT
TUBING CIL FLEX 10 FLL-RA (TUBING) ×1 IMPLANT
WIRE COUGAR XT STRL 190CM (WIRE) IMPLANT

## 2022-08-29 NOTE — Discharge Summary (Signed)
Discharge Summary for Same Day PCI   Patient ID: Richard Nolan. MRN: [id number]; DOB: Feb 18, 1956  Admit date: 08/29/2022 Discharge date: 08/29/2022  Primary Care Provider: Asencion Noble, MD  Primary Cardiologist: Berniece Salines, DO  Primary Electrophysiologist:  None   Discharge Diagnoses    Active Problems:   Coronary artery disease involving native coronary artery of native heart with unstable angina pectoris [facility])  Diagnostic Studies/Procedures    Cardiac Catheterization 08/29/2022:    Prox RCA lesion is 20% stenosed.   Dist RCA lesion is 20% stenosed.   Mid Cx lesion is 50% stenosed.   Prox LAD to Mid LAD lesion is 40% stenosed.   1st Diag lesion is 90% stenosed.   Ost LM to Mid LM lesion is 20% stenosed.   A drug-eluting stent was successfully placed using a STENT ONYX FRONTIER 2.25X12.   Post intervention, there is a 0% residual stenosis.   Mild stenosis in the mid segment of the left main Mild mid LAD stenosis just beyond the takeoff of the first Diagonal branch. Severe stenosis in the proximal segment of the moderate caliber first diagonal branch.  Moderate mid Circumflex stenosis. This does not appears to be flow limiting.  There is a small first OM branch and a moderate caliber second OM branch.  Large dominant RCA with mild proximal and distal stenosis.  Successful PTCA/DES x 1 first Diagonal branch LVEDP=11 mmHg   Recommendations: Will plan to use DAPT with ASA and Plavix for at least six months. Continue statin. Same day post PCI discharge.    Diagnostic Dominance: Right  Intervention   _____________   History of Present Illness     Richard Nolan. is a 67 y.o. male with HTN, HLD and prediabetes who presented to the office with Dr. Harriet Masson for follow up of recent outpatient coronary CTA. He was having exertional symptoms at office on 2/15 and referred for outpatient cardiac cath.    Hospital Course     The patient underwent cardiac cath as noted above with  successful PCI/DES x1 to 1st diag for severe ISR restenosis. Diffuse mild moderate nonobstructive disease in the RCA, Lcx and LAD. Plan for DAPT with ASA/plavix for at least 6 months. The patient was seen by cardiac rehab while in short stay. There were no observed complications post cath. Radial cath site was re-evaluated prior to discharge and found to be stable without any complications. Instructions/precautions regarding cath site care were given prior to discharge.  Richard Allan. was seen by Dr. Angelena Form and determined stable for discharge home. Follow up with our office has been arranged. Medications are listed below. Pertinent changes include addition of plavix, Toprol, SL nitro.    _____________  Cath/PCI Registry Performance & Quality Measures: Aspirin prescribed? - Yes ADP Receptor Inhibitor (Plavix/Clopidogrel, Brilinta/Ticagrelor or Effient/Prasugrel) prescribed (includes medically managed patients)? - Yes High Intensity Statin (Lipitor 40-'80mg'$  or Crestor 20-'40mg'$ ) prescribed? - Yes For EF <40%, was ACEI/ARB prescribed? - Not Applicable (EF >/= [id number]) For EF <40%, Aldosterone Antagonist (Spironolactone or Eplerenone) prescribed? - Not Applicable (EF >/= [id number]) Cardiac Rehab Phase II ordered (Included Medically managed Patients)? - Yes  _____________   Discharge Vitals Blood pressure 138/73, pulse 69, temperature (!) 96.8 F (36 C), temperature source Temporal, resp. rate (!) 21, height '5\' 10"'$  (1.778 m), weight 115.7 kg, SpO2 94 %.  Filed Weights   08/29/22 0558  Weight: 115.7 kg    Last Labs & Radiologic Studies  CBC Recent Labs    08/27/22 0818  WBC 9.0  NEUTROABS 5.1  HGB 16.8  HCT 50.1  MCV 92  PLT 123456   Basic Metabolic Panel Recent Labs    08/27/22 0818 08/29/22 0601  NA 138 138  K 4.6 3.9  CL 102 105  CO2 22 23  GLUCOSE 107* 111*  BUN 14 16  CREATININE 1.51* 1.47*  CALCIUM 9.6 8.9  MG 1.9  --    Liver Function Tests No results for input(s):  "AST", "ALT", "ALKPHOS", "BILITOT", "PROT", "ALBUMIN" in the last 72 hours. No results for input(s): "LIPASE", "AMYLASE" in the last 72 hours. High Sensitivity Troponin:   No results for input(s): "TROPONINIHS" in the last 720 hours.  BNP Invalid input(s): "POCBNP" D-Dimer No results for input(s): "DDIMER" in the last 72 hours. Hemoglobin A1C No results for input(s): "HGBA1C" in the last 72 hours. Fasting Lipid Panel No results for input(s): "CHOL", "HDL", "LDLCALC", "TRIG", "CHOLHDL", "LDLDIRECT" in the last 72 hours. Thyroid Function Tests No results for input(s): "TSH", "T4TOTAL", "T3FREE", "THYROIDAB" in the last 72 hours.  Invalid input(s): "FREET3" _____________  CARDIAC CATHETERIZATION  Result Date: 08/29/2022   Prox RCA lesion is 20% stenosed.   Dist RCA lesion is 20% stenosed.   Mid Cx lesion is 50% stenosed.   Prox LAD to Mid LAD lesion is 40% stenosed.   1st Diag lesion is 90% stenosed.   Ost LM to Mid LM lesion is 20% stenosed.   A drug-eluting stent was successfully placed using a STENT ONYX FRONTIER 2.25X12.   Post intervention, there is a 0% residual stenosis. Mild stenosis in the mid segment of the left main Mild mid LAD stenosis just beyond the takeoff of the first Diagonal branch. Severe stenosis in the proximal segment of the moderate caliber first diagonal branch. Moderate mid Circumflex stenosis. This does not appears to be flow limiting.  There is a small first OM branch and a moderate caliber second OM branch. Large dominant RCA with mild proximal and distal stenosis. Successful PTCA/DES x 1 first Diagonal branch LVEDP=11 mmHg Recommendations: Will plan to use DAPT with ASA and Plavix for at least six months. Continue statin. Same day post PCI discharge.   CT CORONARY MORPH W/CTA COR W/SCORE W/CA W/CM &/OR WO/CM  Addendum Date: 08/24/2022   ADDENDUM REPORT: 08/24/2022 11:28 CLINICAL DATA:  This over-read does not include interpretation of cardiac or coronary anatomy or  pathology. The coronary CTA interpretation by the cardiologist is attached. COMPARISON:  None available. FINDINGS: No suspicious nodules, masses, or infiltrates are identified in the visualized portion of the lungs. No pleural fluid seen. The visualized portions of the mediastinum and hilar regions are unremarkable. IMPRESSION: No significant non-cardiac abnormality identified. Electronically Signed   By: Marlaine Hind M.D.   On: 08/24/2022 11:28   Result Date: 08/24/2022 CLINICAL DATA:  Chest pain EXAM: Cardiac/Coronary CTA TECHNIQUE: A non-contrast, gated CT scan was obtained with axial slices of 3 mm through the heart for calcium scoring. Calcium scoring was performed using the Agatston method. A 120 kV prospective, gated, contrast cardiac scan was obtained. Gantry rotation speed was 250 msecs and collimation was 0.6 mm. Two sublingual nitroglycerin tablets (0.8 mg) were given. The 3D data set was reconstructed in 5% intervals of the 35-75% of the R-R cycle. Diastolic phases were analyzed on a dedicated workstation using MPR, MIP, and VRT modes. The patient received 95 cc of contrast. FINDINGS: Image quality: Excellent. Noise artifact is: Limited. Coronary Arteries:  Normal coronary origin.  Right dominance. Left main: The left main is a large caliber vessel with a normal take off from the left coronary cusp that bifurcates to form a left anterior descending artery and a left circumflex artery. There is moderate calcified plaque in the mid to distal LM with associated stenosis of 50-69%. Left anterior descending artery: The LAD gives off 2 patent diagonal branches. There is moderate calcified plaque in the proximal and mid LAD involving the takeoff of the D1 with associated stenosis of 50-69%. There is mild calcified plaque in the proximal D1 with associated stenosis of 25-49%. Left circumflex artery: The LCX is non-dominant and gives off 2 patent obtuse marginal branches. There is minimal calcified plaque in  the ostial LCx with associated stenosis of < 25%. There is moderate calcified plaque in the mid LCx at the takeoff of a large OM2 branch with associated stenosis of 50-69%. Right coronary artery: The RCA is dominant with normal take off from the right coronary cusp. There is minimal calcified plaque in the proximal RCA with associated stenosis of <25% followed by diffuse mild to moderate calcified plaque in the proximal RCA with associated stenosis of at least 25-49% and in some areas 50-69%. The RCA terminates as a PDA and right posterolateral branch with mild calcified plaque in the distal RCA with associated stenosis of 25-49%. Right Atrium: Right atrial size is within normal limits. Right Ventricle: The right ventricular cavity is within normal limits. Left Atrium: Left atrial size is normal in size with no left atrial appendage filling defect. Left Ventricle: The ventricular cavity size is within normal limits. There are no stigmata of prior infarction. There is no abnormal filling defect. Pulmonary arteries: Normal in size without proximal filling defect. Pulmonary veins: Normal pulmonary venous drainage. Pericardium: Normal thickness with no significant effusion or calcium present. Cardiac valves: The aortic valve is trileaflet without significant calcification. The mitral valve is normal structure without significant calcification. Aorta: Normal caliber with no significant disease. PFO present Extra-cardiac findings: See attached radiology report for non-cardiac structures. IMPRESSION: 1. Coronary calcium score of 968. This was 87th percentile for age-, sex, and race-matched controls. 2.  Normal coronary origin with right dominance. 3.  Moderate atherosclerosis of the LM, LAD, LCx and RCA. 4. Consider symptom-guided anti-ischemic and preventive pharmacotherapy as well as risk factor modification per guideline-directed care. 5.  This study has been submitted for FFR analysis. 6.  Small PFO. RECOMMENDATIONS:  1. CAD-RADS 0: No evidence of CAD (0%). Consider non-atherosclerotic causes of chest pain. 2. CAD-RADS 1: Minimal non-obstructive CAD (0-24%). Consider non-atherosclerotic causes of chest pain. Consider preventive therapy and risk factor modification. 3. CAD-RADS 2: Mild non-obstructive CAD (25-49%). Consider non-atherosclerotic causes of chest pain. Consider preventive therapy and risk factor modification. 4. CAD-RADS 3: Moderate stenosis. Consider symptom-guided anti-ischemic pharmacotherapy as well as risk factor modification per guideline directed care. Additional analysis with CT FFR will be submitted. 5. CAD-RADS 4: Severe stenosis. (70-99% or > 50% left main). Cardiac catheterization or CT FFR is recommended. Consider symptom-guided anti-ischemic pharmacotherapy as well as risk factor modification per guideline directed care. Invasive coronary angiography recommended with revascularization per published guideline statements. 6. CAD-RADS 5: Total coronary occlusion (100%). Consider cardiac catheterization or viability assessment. Consider symptom-guided anti-ischemic pharmacotherapy as well as risk factor modification per guideline directed care. 7. CAD-RADS N: Non-diagnostic study. Obstructive CAD can't be excluded. Alternative evaluation is recommended. Fransico Him, MD Electronically Signed: By: Fransico Him M.D. On: 08/22/2022 09:52  CT CORONARY FRACTIONAL FLOW RESERVE FLUID ANALYSIS  Result Date: 08/23/2022 EXAM: FFRCT ANALYSIS FINDINGS: FFRct analysis was performed on the original cardiac CT angiogram dataset. Diagrammatic representation of the FFRct analysis is provided in a separate PDF document in PACS. This dictation was created using the PDF document and an interactive 3D model of the results. 3D model is not available in the EMR/PACS. Normal FFR range is >0.80. 1. Left Main: No significant stenosis LM FFR = 0.96. 2. LAD: Probable flow limiting lesion in the proximal to mid LAD and diagonal .  Proximal FFR = 0.88, Mid FFR = 0.78. The distal LAD was not modeled. Proximal Diagonal FFR = 0.88, Mid diagonal FFR = 0.73, Distal diagonal FFR = 0.72. 3. LCX: Probable flow limiting lesion in the proximal LCx. Proximal FFR = 0.94,, Mid FFR = 0.77, Distal FFR = 0.69 4. RCA: No significant stenosis. Proximal FFR = 0.99, Mid FFR = 0.92, 0.88. IMPRESSION: 1. Coronary CTA FFR analysis demonstrates possible flow limiting lesions in the proximal to mid LAD (FFR 0.88>0.78), Diagonal (FFR 0.88>0.72) and Proximal LCx (FFR 0.94>0.69). Findings correspond to CT images with at least 50-69% stenosis in the proximal to mid LAD at the takeoff of the diagonal as well as mid LCx lesion of at least 50-69%. 2.  Recommend cardiac catheterization. Fransico Him, MD Electronically Signed   By: Fransico Him M.D.   On: 08/23/2022 11:20    Disposition   Pt is being discharged home today in good condition.  Follow-up Plans & Appointments     Follow-up Information     Deberah Pelton, NP Follow up on 09/09/2022.   Specialty: Cardiology Why: at 10:55am for your follow up appt with Dr. Quintella Reichert' NP [place] [facility] information: [address] [place] [address] [phone]                   Discharge Medications   Allergies as of 08/29/2022       Reactions   Vioxx [rofecoxib] Anaphylaxis   Throat swelling        Medication List     TAKE these medications    allopurinol 100 MG tablet Commonly known as: ZYLOPRIM Take 100 mg by mouth in the morning.   aspirin EC 81 MG tablet Take 81 mg by mouth in the morning.   aspirin-acetaminophen-caffeine 250-250-65 MG tablet Commonly known as: EXCEDRIN MIGRAINE Take 0.5 tablets by mouth daily as needed for headache.   calcium carbonate 500 MG chewable tablet Commonly known as: TUMS - dosed in mg elemental calcium Chew 1 tablet by mouth daily as needed for indigestion or heartburn.   clopidogrel 75 MG tablet Commonly known as: Plavix Take  1 tablet (75 mg total) by mouth daily.   metoprolol succinate 25 MG 24 hr tablet Commonly known as: Toprol XL Take 1 tablet (25 mg total) by mouth daily.   nitroGLYCERIN 0.4 MG SL tablet Commonly known as: Nitrostat Place 1 tablet (0.4 mg total) under the tongue every 5 (five) minutes as needed for chest pain.   rosuvastatin 20 MG tablet Commonly known as: CRESTOR Take 20 mg by mouth at bedtime.           Allergies Allergies  Allergen Reactions   Vioxx [Rofecoxib] Anaphylaxis    Throat swelling    Outstanding Labs/Studies   N/a   Duration of Discharge Encounter   Greater than 30 minutes including physician time.  Signed, Reino Bellis, NP 08/29/2022, 1:44 PM

## 2022-08-29 NOTE — Progress Notes (Signed)
CARDIAC REHAB PHASE I     Post stent education including site care, restrictions, risk factors, exercise guidelines, antiplatelet therapy importance, heart healthy diet and CRP2 reviewed with pt and wife. All questions and concerns addressed. Will refer to AP for CRP2. Plan for home later today.   1300-1400  Vanessa Barbara, RN BSN 08/29/2022 1:55 PM

## 2022-08-29 NOTE — Interval H&P Note (Signed)
History and Physical Interval Note:  08/29/2022 7:11 AM  [name]  has presented today for surgery, with the diagnosis of cad with angina.  The various methods of treatment have been discussed with the patient and family. After consideration of risks, benefits and other options for treatment, the patient has consented to  Procedure(s): LEFT HEART CATH AND CORONARY ANGIOGRAPHY (N/A) as a surgical intervention.  The patient's history has been reviewed, patient examined, no change in status, stable for surgery.  I have reviewed the patient's chart and labs.  Questions were answered to the patient's satisfaction.    Cath Lab Visit (complete for each Cath Lab visit)  Clinical Evaluation Leading to the Procedure:   ACS: No.  Non-ACS:    Anginal Classification: CCS II  Anti-ischemic medical therapy: No Therapy  Non-Invasive Test Results: High-risk stress test findings: cardiac mortality >3%/year (Coronary CTA with possible flow limiting lesions in the LAD, Diagonal and Circumflex)  Prior CABG: No previous CABG        Lauree Chandler

## 2022-08-30 ENCOUNTER — Encounter (HOSPITAL_COMMUNITY): Payer: Self-pay | Admitting: Cardiovascular Disease

## 2022-09-08 ENCOUNTER — Encounter: Payer: Self-pay | Admitting: General Practice

## 2022-09-08 NOTE — Progress Notes (Unsigned)
Cardiology Office Note:    Date:  09/09/2022   ID:  Richard Nolan., DOB June 19, 1956, MRN [id number]  PCP:  Asencion Noble, MD   [facility] Cardiologist:  Berniece Salines, DO     Referring MD: Asencion Noble, MD   No chief complaint on file.   History of Present Illness:    Sergey Craigen. is a 67 y.o. male with a hx of  CAD (PTCA/DES x 1 first Diagonal branch 08/29/22), hypertension, HLD, prediabetes, aortic dilatation (7m 08/08/21), OSA, obesity.  He initially establish care with Dr. THarriet Massonon 08/10/2021, he had no specific complaints however he had just undergone coronary calcium score by his PCP which was 543.  He was then referred to cardiology for follow-up.   He was evaluated by Dr. THarriet Massonon 08/15/2022 after experiencing intermittent chest discomfort on exertion.  The decision was made to send him for coronary CTA.  On 08/24/2022 he had a coronary CTA which showed a calcium score of 968, placing him in the [place] for age, sex, race matched controls, moderate atherosclerosis of the LM, LAD, LCx, and RCA.  CTA FFR demonstrated possible flow-limiting lesion in the proximal to mid LAD, diagonal, and proximal LCx with recommendations to proceed with cardiac catheterization.  He underwent a left heart cath on 08/29/2022, DES x 1 to first diagonal for severe ISR restenosis, diffuse mild to moderate nonobstructive disease in the RCA, LCx, LAD.  Plan for DAPT with aspirin/Plavix for 6 months.  He presents today accompanied by his wife for follow up after LHC. He states he is doing well and had recently started to work outside again. He denies chest pain, palpitations, dyspnea, pnd, orthopnea, n, v, dizziness, syncope, edema, weight gain, or early satiety. He is eager to start cardiac rehab.   Past Medical History:  Diagnosis Date   Aortic dilatation ([place] 08/2022   386m  CAD (coronary artery disease) 08/29/2022   DES 1st dx   Hyperlipidemia    Hypertension    OSA  (obstructive sleep apnea)    CPAP nightly   Stenosing tenosynovitis of finger of left hand    left index and left middle fingers    Past Surgical History:  Procedure Laterality Date   COLONOSCOPY N/A 06/27/2015   Procedure: COLONOSCOPY;  Surgeon: NaRogene HoustonMD;  [facility];  Service: Endoscopy;  Laterality: N/A;  1030   CORONARY STENT INTERVENTION N/A 08/29/2022   Procedure: CORONARY STENT INTERVENTION;  Surgeon: McBurnell BlanksMD;  [facility];  Service: Cardiovascular;  Laterality: N/A;   HERNIA REPAIR     LEFT HEART CATH AND CORONARY ANGIOGRAPHY N/A 08/29/2022   Procedure: LEFT HEART CATH AND CORONARY ANGIOGRAPHY;  Surgeon: McBurnell BlanksMD;  [facility];  Service: Cardiovascular;  Laterality: N/A;   TRIGGER FINGER RELEASE Left 05/09/2015   Procedure: RELEASE TRIGGER FINGER/A-1 PULLEY, left index finger and left middle finger;  Surgeon: GaDaryll BrodMD;  [facility] Service: [facility]ics;  Laterality: Left;   UMBILICAL HERNIA REPAIR      Current Medications: Current Meds  Medication Sig   allopurinol (ZYLOPRIM) 100 MG tablet Take 100 mg by mouth in the morning.   aspirin EC 81 MG tablet Take 81 mg by mouth in the morning.   aspirin-acetaminophen-caffeine (EXCEDRIN MIGRAINE) 250-250-65 MG tablet Take 0.5 tablets by mouth daily as needed for headache.   calcium carbonate (TUMS - DOSED  IN MG ELEMENTAL CALCIUM) 500 MG chewable tablet Chew 1 tablet by mouth daily as needed for indigestion or heartburn.   clopidogrel (PLAVIX) 75 MG tablet Take 1 tablet (75 mg total) by mouth daily.   metoprolol succinate (TOPROL XL) 25 MG 24 hr tablet Take 1 tablet (25 mg total) by mouth daily.   nitroGLYCERIN (NITROSTAT) 0.4 MG SL tablet Place 1 tablet (0.4 mg total) under the tongue every 5 (five) minutes as needed for chest pain.   rosuvastatin (CRESTOR) 20 MG tablet Take 20 mg by mouth at bedtime.      Allergies:   Vioxx [rofecoxib]   Social History   Socioeconomic History   Marital status: Married    Spouse name: Not on file   Number of children: Y   Years of education: Not on file   Highest education level: Not on file  Occupational History   Occupation: truck driver  Tobacco Use   Smoking status: Never   Smokeless tobacco: Never  Vaping Use   Vaping Use: Never used  Substance and Sexual Activity   Alcohol use: No    Alcohol/week: 0.0 standard drinks of alcohol   Drug use: No   Sexual activity: Never  Other Topics Concern   Not on file  Social History Narrative   Not on file   Social Determinants of Health   Financial Resource Strain: Not on file  Food Insecurity: Not on file  Transportation Needs: Not on file  Physical Activity: Not on file  Stress: Not on file  Social Connections: Not on file     Family History: The patient's family history includes Heart disease in his father.  ROS:   Please see the history of present illness.     All other systems reviewed and are negative.  EKGs/Labs/Other Studies Reviewed:    The following studies were reviewed today:  LHC 08/29/22  Prox RCA lesion is 20% stenosed.   Dist RCA lesion is 20% stenosed.   Mid Cx lesion is 50% stenosed.   Prox LAD to Mid LAD lesion is 40% stenosed.   1st Diag lesion is 90% stenosed.   Ost LM to Mid LM lesion is 20% stenosed.   A drug-eluting stent was successfully placed using a STENT ONYX FRONTIER 2.25X12.   Post intervention, there is a 0% residual stenosis.   Mild stenosis in the mid segment of the left main Mild mid LAD stenosis just beyond the takeoff of the first Diagonal branch. Severe stenosis in the proximal segment of the moderate caliber first diagonal branch.  Moderate mid Circumflex stenosis. This does not appears to be flow limiting.  There is a small first OM branch and a moderate caliber second OM branch.  Large dominant RCA with mild proximal and distal stenosis.   Successful PTCA/DES x 1 first Diagonal branch LVEDP=11 mmHg   Recommendations: Will plan to use DAPT with ASA and Plavix for at least six months. Continue statin. Same day post PCI discharge.    EKG:  EKG is  ordered today.  The ekg ordered today demonstrates SR, HR 65 bpm, similar with previous EKG tracings.   Recent Labs: 08/27/2022: Hemoglobin 16.8; Magnesium 1.9; Platelets 225 08/29/2022: BUN 16; Creatinine, Ser 1.47; Potassium 3.9; Sodium 138  Recent Lipid Panel    Component Value Date/Time   CHOL 101 08/15/2022 0846   TRIG 87 08/15/2022 0846   HDL 42 08/15/2022 0846   CHOLHDL 2.4 08/15/2022 0846   LDLCALC 42 08/15/2022 0846  Risk Assessment/Calculations:                Physical Exam:    VS:  BP 120/78   Pulse 65   Ht '5\' 9"'$  (1.753 m)   Wt 258 lb (117 kg)   SpO2 97%   BMI 38.10 kg/m     Wt Readings from Last 3 Encounters:  09/09/22 258 lb (117 kg)  08/29/22 255 lb (115.7 kg)  08/26/22 260 lb 6.4 oz (118.1 kg)     GEN:  Well nourished, well developed in no acute distress HEENT: Normal NECK: No JVD; No carotid bruits LYMPHATICS: No lymphadenopathy CARDIAC: RRR, no murmurs, rubs, gallops RESPIRATORY:  Clear to auscultation without rales, wheezing or rhonchi  ABDOMEN: Soft, non-tender, non-distended MUSCULOSKELETAL:  No edema; No deformity  SKIN: Warm and dry NEUROLOGIC:  Alert and oriented x 3 PSYCHIATRIC:  Normal affect   ASSESSMENT:    1. Coronary artery disease involving coronary bypass graft of native heart with angina pectoris ([place])   2. Hypertension, unspecified type   3. Hyperlipidemia, unspecified hyperlipidemia type   4. OSA (obstructive sleep apnea)   5. Aortic dilatation (HCC)   6. Stage 3a chronic kidney disease ([place])    PLAN:    In order of problems listed above:  1. CAD - LHC on 08/29/22 with PTCA/DES x 1 first Diagonal branch, Stable with no anginal symptoms. No indication for ischemic evaluation.  Continue ASA, Plavix, NTG as  needed (has not needed). He is eager to start cardiac rehab.   2. HTN - BP today 120/78, well controlled, continue current antihypertensive medication regimen.   3. Hyperlipidemia - LDL 42 on 08/15/22, well controlled, continue rosuvastatin.   4. OSA - complaint with his CPAP  5. Aortic dilatation - 64m on recent coronary CTA, consider repeat evaluation in 1-2 years.   6. CKD 3a - Cr 1.47 on 08/29/22, baseline is around 1.2, will repeat BMET  Disposition - BMET, already has f/u appt with Dr. THarriet Massonin June.      Cardiac Rehabilitation Eligibility Assessment            Medication Adjustments/Labs and Tests Ordered: Current medicines are reviewed at length with the patient today.  Concerns regarding medicines are outlined above.  Orders Placed This Encounter  Procedures   Basic metabolic panel   EKG [id number]  No orders of the defined types were placed in this encounter.   Patient Instructions  Medication Instructions:  The current medical regimen is effective;  continue present plan and medications as directed. Please refer to the Current Medication list given to you today.  *If you need a refill on your cardiac medications before your next appointment, please call your pharmacy*  Lab Work: BMET TODAY If you have labs (blood work) drawn today and your tests are completely normal, you will receive your results only by: [place](if you have MyChart) OR  A paper copy in the mail If you have any lab test that is abnormal or we need to change your treatment, we will call you to review the results.  Testing/Procedures: NONE  Follow-Up: [facility] and your [facility] needs are our priority.  As part of our continuing mission to provide you with exceptional heart care, we have created designated Provider Care Teams.  These Care Teams include your primary Cardiologist (physician) and Advanced Practice Providers (APPs -  Physician Assistants and Nurse  Practitioners) who all work together to provide you with the  care you need, when you need it.  Your next appointment:   KEEP SCHEDULED APPOINTMENT   Provider:   Berniece Salines, DO     Other Instructions     Signed, Trudi Ida, NP  09/09/2022 12:12 PM    [place]

## 2022-09-09 ENCOUNTER — Encounter: Payer: Self-pay | Admitting: General Practice

## 2022-09-09 ENCOUNTER — Ambulatory Visit: Payer: Medicare Other | Attending: General Practice | Admitting: Cardiology

## 2022-09-09 VITALS — BP 120/78 | HR 65 | Ht 69.0 in | Wt 258.0 lb

## 2022-09-09 DIAGNOSIS — I25709 Atherosclerosis of coronary artery bypass graft(s), unspecified, with unspecified angina pectoris: Secondary | ICD-10-CM | POA: Diagnosis present

## 2022-09-09 DIAGNOSIS — I77819 Aortic ectasia, unspecified site: Secondary | ICD-10-CM

## 2022-09-09 DIAGNOSIS — N1831 Chronic kidney disease, stage 3a: Secondary | ICD-10-CM

## 2022-09-09 DIAGNOSIS — G4733 Obstructive sleep apnea (adult) (pediatric): Secondary | ICD-10-CM | POA: Diagnosis not present

## 2022-09-09 DIAGNOSIS — I10 Essential (primary) hypertension: Secondary | ICD-10-CM | POA: Diagnosis not present

## 2022-09-09 DIAGNOSIS — E785 Hyperlipidemia, unspecified: Secondary | ICD-10-CM | POA: Diagnosis not present

## 2022-09-09 LAB — BASIC METABOLIC PANEL WITH GFR
BUN/Creatinine Ratio: 10 (ref 10–24)
BUN: 14 mg/dL (ref 8–27)
CO2: 22 mmol/L (ref 20–29)
Calcium: 9 mg/dL (ref 8.6–10.2)
Chloride: 106 mmol/L (ref 96–106)
Creatinine, Ser: 1.42 mg/dL — ABNORMAL HIGH (ref 0.76–1.27)
Glucose: 103 mg/dL — ABNORMAL HIGH (ref 70–99)
Potassium: 4.5 mmol/L (ref 3.5–5.2)
Sodium: 140 mmol/L (ref 134–144)
eGFR: 54 mL/min/1.73 — ABNORMAL LOW

## 2022-09-09 NOTE — Patient Instructions (Signed)
Medication Instructions:  The current medical regimen is effective;  continue present plan and medications as directed. Please refer to the Current Medication list given to you today.  *If you need a refill on your cardiac medications before your next appointment, please call your pharmacy*  Lab Work: BMET TODAY If you have labs (blood work) drawn today and your tests are completely normal, you will receive your results only by: [place] (if you have MyChart) OR  A paper copy in the mail If you have any lab test that is abnormal or we need to change your treatment, we will call you to review the results.  Testing/Procedures: NONE  Follow-Up: At [facility], you and your health needs are our priority.  As part of our continuing mission to provide you with exceptional heart care, we have created designated Provider Care Teams.  These Care Teams include your primary Cardiologist (physician) and Advanced Practice Providers (APPs -  Physician Assistants and Nurse Practitioners) who all work together to provide you with the care you need, when you need it.  Your next appointment:   KEEP SCHEDULED APPOINTMENT   Provider:   Berniece Salines, DO     Other Instructions

## 2022-09-12 ENCOUNTER — Telehealth: Payer: Self-pay | Admitting: Cardiology

## 2022-09-12 NOTE — Telephone Encounter (Signed)
Cath/stent on 08/29/22  Routed to Dr. Harriet Masson and Anderson Malta NP (who saw him 3/11) for review

## 2022-09-12 NOTE — Telephone Encounter (Signed)
Spoke with Beverlee Nims, PT and relayed message from [place] NP.

## 2022-09-12 NOTE — Telephone Encounter (Signed)
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  Thank you, Anderson Malta

## 2022-09-12 NOTE — Telephone Encounter (Signed)
New Message:     Beverlee Nims, Physical Therapist [facility] called. She said they are seeing patient for his hip. She wants to know if Dr Harriet Masson have any physical or exercise restrictions?Marland Kitchen

## 2022-09-23 ENCOUNTER — Telehealth: Payer: Self-pay | Admitting: Cardiology

## 2022-09-23 MED ORDER — CLOPIDOGREL BISULFATE 75 MG PO TABS: 75.0000 mg | ORAL_TABLET | Freq: Every day | ORAL | 1 refills | Status: DC

## 2022-09-23 NOTE — Telephone Encounter (Signed)
*  STAT* If patient is at the pharmacy, call can be transferred to refill team.   1. Which medications need to be refilled? (please list name of each medication and dose if known) clopidogrel (PLAVIX) 75 MG tablet   2. Which pharmacy/location (including street and city if local pharmacy) is medication to be sent to? [id number] - [place], [place] - [address] AT [address]   3. Do they need a 30 day or 90 day supply? 90  

## 2022-10-02 ENCOUNTER — Encounter (HOSPITAL_COMMUNITY)
Admission: RE | Admit: 2022-10-02 | Discharge: 2022-10-02 | Disposition: A | Discharge status: Home / Self Care | Payer: Medicare Other | Source: Ambulatory Visit | Attending: Cardiology | Admitting: Cardiology

## 2022-10-02 VITALS — Ht 69.0 in | Wt 257.5 lb

## 2022-10-02 DIAGNOSIS — Z955 Presence of coronary angioplasty implant and graft: Secondary | ICD-10-CM | POA: Insufficient documentation

## 2022-10-02 NOTE — Progress Notes (Signed)
Cardiac Individual Treatment Plan  Patient Details  Name: Richard Nolan. MRN: [id number] Date of Birth: [birth year] Referring Provider:   Flowsheet Row CARDIAC REHAB PHASE II ORIENTATION from 10/02/2022 in [facility]  Referring Provider Dr. Angelena Form       Initial Encounter Date:  Flowsheet Row CARDIAC REHAB PHASE II ORIENTATION from 10/02/2022 in [place]  Date 10/02/22       Visit Diagnosis: Status post coronary artery stent placement  Patient's Home Medications on Admission:  Current Outpatient Medications:    allopurinol (ZYLOPRIM) 100 MG tablet, Take 100 mg by mouth in the morning., Disp: , Rfl:    aspirin EC 81 MG tablet, Take 81 mg by mouth in the morning., Disp: , Rfl:    calcium carbonate (TUMS - DOSED IN MG ELEMENTAL CALCIUM) 500 MG chewable tablet, Chew 1 tablet by mouth daily as needed for indigestion or heartburn., Disp: , Rfl:    clopidogrel (PLAVIX) 75 MG tablet, Take 1 tablet (75 mg total) by mouth daily., Disp: 90 tablet, Rfl: 1   metoprolol succinate (TOPROL XL) 25 MG 24 hr tablet, Take 1 tablet (25 mg total) by mouth daily., Disp: 30 tablet, Rfl: 11   nitroGLYCERIN (NITROSTAT) 0.4 MG SL tablet, Place 1 tablet (0.4 mg total) under the tongue every 5 (five) minutes as needed for chest pain., Disp: 25 tablet, Rfl: 1   rosuvastatin (CRESTOR) 20 MG tablet, Take 20 mg by mouth at bedtime., Disp: , Rfl:   Past Medical History: Past Medical History:  Diagnosis Date   Aortic dilatation ([place]) 08/2022   78mm   CAD (coronary artery disease) 08/29/2022   DES 1st dx   Hyperlipidemia    Hypertension    OSA (obstructive sleep apnea)    CPAP nightly   Stenosing tenosynovitis of finger of left hand    left index and left middle fingers    Tobacco Use: Social History   Tobacco Use  Smoking Status Never  Smokeless Tobacco Never    Labs: Review Flowsheet       Latest Ref Rng & Units 08/15/2022  Labs for ITP Cardiac  and Pulmonary Rehab  Cholestrol 100 - 199 mg/dL 101   LDL (calc) 0 - 99 mg/dL 42   HDL-C >39 mg/dL 42   Trlycerides 0 - 149 mg/dL 87     Capillary Blood Glucose: No results found for: "GLUCAP"   Exercise Target Goals: Exercise Program Goal: Individual exercise prescription set using results from initial 6 min walk test and THRR while considering  patient's activity barriers and safety.   Exercise Prescription Goal: Starting with aerobic activity 30 plus minutes a day, 3 days per week for initial exercise prescription. Provide home exercise prescription and guidelines that participant acknowledges understanding prior to discharge.  Activity Barriers & Risk Stratification:  Activity Barriers & Cardiac Risk Stratification - 10/02/22 1257       Activity Barriers & Cardiac Risk Stratification   Activity Barriers Arthritis;Back Problems;Neck/Spine Problems;Joint Problems;Deconditioning;Shortness of Breath    Cardiac Risk Stratification High             6 Minute Walk:  6 Minute Walk     Row Name 10/02/22 1404         6 Minute Walk   Phase Initial     Distance 1500 feet     Walk Time 6 minutes     # of Rest Breaks 0     MPH 2.84  METS 2.62     RPE 11     VO2 Peak 9.18     Symptoms No     Resting HR 65 bpm     Resting BP 126/80     Resting Oxygen Saturation  96 %     Exercise Oxygen Saturation  during 6 min walk 94 %     Max Ex. HR 82 bpm     Max Ex. BP 140/80     2 Minute Post BP 120/70              Oxygen Initial Assessment:   Oxygen Re-Evaluation:   Oxygen Discharge (Final Oxygen Re-Evaluation):   Initial Exercise Prescription:  Initial Exercise Prescription - 10/02/22 1400       Date of Initial Exercise RX and Referring Provider   Date 10/02/22    Referring Provider Dr. Angelena Form    Expected Discharge Date 12/25/22      Treadmill   MPH 2    Grade 0    Minutes 17      Recumbant Elliptical   Level 1    RPM 45    Minutes 22       Prescription Details   Frequency (times per week) 3    Duration Progress to 30 minutes of continuous aerobic without signs/symptoms of physical distress      Intensity   THRR 40-80% of Max Heartrate 61-122    Ratings of Perceived Exertion 11-13      Resistance Training   Training Prescription Yes    Weight 3    Reps 10-15             Perform Capillary Blood Glucose checks as needed.  Exercise Prescription Changes:   Exercise Comments:   Exercise Goals and Review:   Exercise Goals     Row Name 10/02/22 1408             Exercise Goals   Increase Physical Activity Yes       Intervention Provide advice, education, support and counseling about physical activity/exercise needs.;Develop an individualized exercise prescription for aerobic and resistive training based on initial evaluation findings, risk stratification, comorbidities and participant's personal goals.       Expected Outcomes Short Term: Attend rehab on a regular basis to increase amount of physical activity.;Long Term: Add in home exercise to make exercise part of routine and to increase amount of physical activity.;Long Term: Exercising regularly at least 3-5 days a week.       Increase Strength and Stamina Yes       Intervention Provide advice, education, support and counseling about physical activity/exercise needs.;Develop an individualized exercise prescription for aerobic and resistive training based on initial evaluation findings, risk stratification, comorbidities and participant's personal goals.       Expected Outcomes Short Term: Increase workloads from initial exercise prescription for resistance, speed, and METs.;Short Term: Perform resistance training exercises routinely during rehab and add in resistance training at home;Long Term: Improve cardiorespiratory fitness, muscular endurance and strength as measured by increased METs and functional capacity (6MWT)       Able to understand and use rate of  perceived exertion (RPE) scale Yes       Intervention Provide education and explanation on how to use RPE scale       Expected Outcomes Short Term: Able to use RPE daily in rehab to express subjective intensity level;Long Term:  Able to use RPE to guide intensity level when exercising independently  Knowledge and understanding of Target Heart Rate Range (THRR) Yes       Intervention Provide education and explanation of THRR including how the numbers were predicted and where they are located for reference       Expected Outcomes Short Term: Able to state/look up THRR;Long Term: Able to use THRR to govern intensity when exercising independently;Short Term: Able to use daily as guideline for intensity in rehab       Able to check pulse independently Yes       Intervention Provide education and demonstration on how to check pulse in carotid and radial arteries.;Review the importance of being able to check your own pulse for safety during independent exercise       Expected Outcomes Long Term: Able to check pulse independently and accurately;Short Term: Able to explain why pulse checking is important during independent exercise       Understanding of Exercise Prescription Yes       Intervention Provide education, explanation, and written materials on patient's individual exercise prescription       Expected Outcomes Short Term: Able to explain program exercise prescription;Long Term: Able to explain home exercise prescription to exercise independently                Exercise Goals Re-Evaluation :    Discharge Exercise Prescription (Final Exercise Prescription Changes):   Nutrition:  Target Goals: Understanding of nutrition guidelines, daily intake of sodium 1500mg , cholesterol 200mg , calories 30% from fat and 7% or less from saturated fats, daily to have 5 or more servings of fruits and vegetables.  Biometrics:  Pre Biometrics - 10/02/22 1409       Pre Biometrics   Height 5\' 9"   (1.753 m)    Weight 116.8 kg    Waist Circumference 52 inches    Hip Circumference 49 inches    Waist to Hip Ratio 1.06 %    BMI (Calculated) 38.01    Triceps Skinfold 15 mm    % Body Fat 37 %    Grip Strength 25.7 kg    Flexibility 0 in    Single Leg Stand 20.21 seconds              Nutrition Therapy Plan and Nutrition Goals:  Nutrition Therapy & Goals - 10/02/22 1309       Personal Nutrition Goals   Comments Pt is eating a low sodium diet and trying to stay away from high sugar foods.      Intervention Plan   Intervention Nutrition handout(s) given to patient.;Prescribe, educate and counsel regarding individualized specific dietary modifications aiming towards targeted core components such as weight, hypertension, lipid management, diabetes, heart failure and other comorbidities.    Expected Outcomes Short Term Goal: Understand basic principles of dietary content, such as calories, fat, sodium, cholesterol and nutrients.;Long Term Goal: Adherence to prescribed nutrition plan.             Nutrition Assessments:  Nutrition Assessments - 10/02/22 1312       MEDFICTS Scores   Pre Score 27            MEDIFICTS Score Key: ?70 Need to make dietary changes  40-70 Heart Healthy Diet ? 40 Therapeutic Level Cholesterol Diet   Picture Your Plate Scores: [id number] Unhealthy dietary pattern with much room for improvement. 41-50 Dietary pattern unlikely to meet recommendations for good health and room for improvement. 51-60 More healthful dietary pattern, with some room for improvement.  >60 Healthy dietary pattern,  although there may be some specific behaviors that could be improved.    Nutrition Goals Re-Evaluation:   Nutrition Goals Discharge (Final Nutrition Goals Re-Evaluation):   Psychosocial: Target Goals: Acknowledge presence or absence of significant depression and/or stress, maximize coping skills, provide positive support system. Participant is able to  verbalize types and ability to use techniques and skills needed for reducing stress and depression.  Initial Review & Psychosocial Screening:  Initial Psych Review & Screening - 10/02/22 1305       Initial Review   Current issues with None Identified      Family Dynamics   Good Support System? Yes    Comments Pt has a good support system with his wife and his 3 children.      Barriers   Psychosocial barriers to participate in program There are no identifiable barriers or psychosocial needs.      Screening Interventions   Interventions Encouraged to exercise;To provide support and resources with identified psychosocial needs;Provide feedback about the scores to participant    Expected Outcomes Short Term goal: Identification and review with participant of any Quality of Life or Depression concerns found by scoring the questionnaire.;Long Term goal: The participant improves quality of Life and PHQ9 Scores as seen by post scores and/or verbalization of changes             Quality of Life Scores:  Quality of Life - 10/02/22 1412       Quality of Life   Select Quality of Life      Quality of Life Scores   Health/Function Pre 28.4 %    Socioeconomic Pre 30 %    Psych/Spiritual Pre 28.29 %    Family Pre 30 %    GLOBAL Pre 28.94 %            Scores of 19 and below usually indicate a poorer quality of life in these areas.  A difference of  2-3 points is a clinically meaningful difference.  A difference of 2-3 points in the total score of the Quality of Life Index has been associated with significant improvement in overall quality of life, self-image, physical symptoms, and general health in studies assessing change in quality of life.  PHQ-9: Review Flowsheet  More data exists      10/02/2022 05/09/2018 10/06/2017 07/23/2017 07/07/2015  Depression screen PHQ 2/9  Decreased Interest 0 0 0 0 0  Down, Depressed, Hopeless 0 0 0 0 0  PHQ - 2 Score 0 0 0 0 0  Altered sleeping 0 - - -  -  Tired, decreased energy 0 - - - -  Change in appetite 0 - - - -  Feeling bad or failure about yourself  0 - - - -  Trouble concentrating 0 - - - -  Moving slowly or fidgety/restless 0 - - - -  Suicidal thoughts 0 - - - -  PHQ-9 Score 0 - - - -  Difficult doing work/chores Not difficult at all - - - -   Interpretation of Total Score  Total Score Depression Severity:  1-4 = Minimal depression, 5-9 = Mild depression, 10-14 = Moderate depression, 15-19 = Moderately severe depression, 20-27 = Severe depression   Psychosocial Evaluation and Intervention:  Psychosocial Evaluation - 10/02/22 1334       Psychosocial Evaluation & Interventions   Comments Pt has no identifiable psychosocial barriers.  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  His PHQ-9 was a zero.  His goals for the program are to increase his strength/stamina and be able to breathe better.  He also wants to be able to play with his grandchildren.  We will continue to monitor his progress as he works toward these goals.    Expected Outcomes Pt will continue to have no identifiable psychosocial barriers.    Continue Psychosocial Services  No Follow up required             Psychosocial Re-Evaluation:   Psychosocial Discharge (Final Psychosocial Re-Evaluation):   Vocational Rehabilitation: Provide vocational rehab assistance to qualifying candidates.   Vocational Rehab Evaluation & Intervention:  Vocational Rehab - 10/02/22 1320       Initial Vocational Rehab Evaluation & Intervention   Assessment shows need for Vocational Rehabilitation No      Vocational Rehab Re-Evaulation   Comments pt is retired.             Education: Education Goals: Education classes will be provided on a weekly basis, covering required topics. Participant will state understanding/return demonstration of topics presented.  Learning Barriers/Preferences:  Learning  Barriers/Preferences - 10/02/22 1315       Learning Barriers/Preferences   Learning Barriers None    Learning Preferences Written Material;Verbal Instruction             Education Topics: Hypertension, Hypertension Reduction -Define heart disease and high blood pressure. Discus how high blood pressure affects the body and ways to reduce high blood pressure.   Exercise and Your Heart -Discuss why it is important to exercise, the FITT principles of exercise, normal and abnormal responses to exercise, and how to exercise safely.   Angina -Discuss definition of angina, causes of angina, treatment of angina, and how to decrease risk of having angina.   Cardiac Medications -Review what the following cardiac medications are used for, how they affect the body, and side effects that may occur when taking the medications.  Medications include Aspirin, Beta blockers, calcium channel blockers, ACE Inhibitors, angiotensin receptor blockers, diuretics, digoxin, and antihyperlipidemics.   Congestive Heart Failure -Discuss the definition of CHF, how to live with CHF, the signs and symptoms of CHF, and how keep track of weight and sodium intake.   Heart Disease and Intimacy -Discus the effect sexual activity has on the heart, how changes occur during intimacy as we age, and safety during sexual activity.   Smoking Cessation / COPD -Discuss different methods to quit smoking, the health benefits of quitting smoking, and the definition of COPD.   Nutrition I: Fats -Discuss the types of cholesterol, what cholesterol does to the heart, and how cholesterol levels can be controlled.   Nutrition II: Labels -Discuss the different components of food labels and how to read food label   Heart Parts/Heart Disease and PAD -Discuss the anatomy of the heart, the pathway of blood circulation through the heart, and these are affected by heart disease.   Stress I: Signs and Symptoms -Discuss the  causes of stress, how stress may lead to anxiety and depression, and ways to limit stress.   Stress II: Relaxation -Discuss different types of relaxation techniques to limit stress.   Warning Signs of Stroke / TIA -Discuss definition of a stroke, what the signs and symptoms are of a stroke, and how to identify when someone is having stroke.   Knowledge Questionnaire Score:  Knowledge Questionnaire Score - 10/02/22 1317       Knowledge Questionnaire Score  Pre Score 22/24             Core Components/Risk Factors/Patient Goals at Admission:  Personal Goals and Risk Factors at Admission - 10/02/22 1320       Core Components/Risk Factors/Patient Goals on Admission    Weight Management Yes;Weight Loss    Intervention Obesity: Provide education and appropriate resources to help participant work on and attain dietary goals.;Weight Management/Obesity: Establish reasonable short term and long term weight goals.;Weight Management: Provide education and appropriate resources to help participant work on and attain dietary goals.;Weight Management: Develop a combined nutrition and exercise program designed to reach desired caloric intake, while maintaining appropriate intake of nutrient and fiber, sodium and fats, and appropriate energy expenditure required for the weight goal.    Expected Outcomes Short Term: Continue to assess and modify interventions until short term weight is achieved;Long Term: Adherence to nutrition and physical activity/exercise program aimed toward attainment of established weight goal;Weight Maintenance: Understanding of the daily nutrition guidelines, which includes 25-35% calories from fat, 7% or less cal from saturated fats, less than 200mg  cholesterol, less than 1.5gm of sodium, & 5 or more servings of fruits and vegetables daily;Weight Loss: Understanding of general recommendations for a balanced deficit meal plan, which promotes 1-2 lb weight loss per week and  includes a negative energy balance of [phone] kcal/d;Understanding recommendations for meals to include 15-35% energy as protein, 25-35% energy from fat, 35-60% energy from carbohydrates, less than 200mg  of dietary cholesterol, 20-35 gm of total fiber daily;Understanding of distribution of calorie intake throughout the day with the consumption of 4-5 meals/snacks    Improve shortness of breath with ADL's Yes    Intervention Provide education, individualized exercise plan and daily activity instruction to help decrease symptoms of SOB with activities of daily living.    Expected Outcomes Short Term: Improve cardiorespiratory fitness to achieve a reduction of symptoms when performing ADLs;Long Term: Be able to perform more ADLs without symptoms or delay the onset of symptoms    Hypertension Yes    Intervention Provide education on lifestyle modifcations including regular physical activity/exercise, weight management, moderate sodium restriction and increased consumption of fresh fruit, vegetables, and low fat dairy, alcohol moderation, and smoking cessation.;Monitor prescription use compliance.    Expected Outcomes Short Term: Continued assessment and intervention until BP is < 140/7mm HG in hypertensive participants. < 130/79mm HG in hypertensive participants with diabetes, heart failure or chronic kidney disease.;Long Term: Maintenance of blood pressure at goal levels.    Lipids Yes    Intervention Provide education and support for participant on nutrition & aerobic/resistive exercise along with prescribed medications to achieve LDL 70mg , HDL >40mg .    Expected Outcomes Short Term: Participant states understanding of desired cholesterol values and is compliant with medications prescribed. Participant is following exercise prescription and nutrition guidelines.;Long Term: Cholesterol controlled with medications as prescribed, with individualized exercise RX and with personalized nutrition plan. Value  goals: LDL < 70mg , HDL > 40 mg.    Personal Goal Other Yes    Personal Goal Pt wants to be able to breathe better and increase his strength/stamina.    Intervention Pt will attend [facility] 3 times a week and continue his home exercise program.    Expected Outcomes Pt will meet both the program and his personal goals.             Core Components/Risk Factors/Patient Goals Review:    Core Components/Risk Factors/Patient Goals at Discharge (Final Review):    ITP Comments:  Comments: Marland KitchenMarland KitchenPatient arrived for 1st visit/orientation/education at 12:30. Patient was referred to CR by Dr. Angelena Form due to the pt being s/p coronary stent placement (Z95.5). During orientation advised patient on arrival and appointment times what to wear, what to do before, during and after exercise. Reviewed attendance and class policy.  Pt is scheduled to return Cardiac Rehab on 10/04/22 at 8:15. Pt was advised to come to class 15 minutes before class starts.  Discussed RPE/Dpysnea scales. Patient participated in warm up stretches. Patient was able to complete 6 minute walk test.  Telemetry:NSR.  Patient was measured for the equipment. Discussed equipment safety with patient. Took patient pre-anthropometric measurements. Patient finished visit at 14:00.

## 2022-10-04 ENCOUNTER — Encounter (HOSPITAL_COMMUNITY)
Admission: RE | Admit: 2022-10-04 | Discharge: 2022-10-04 | Disposition: A | Discharge status: Home / Self Care | Payer: Medicare Other | Source: Ambulatory Visit | Attending: Cardiology | Admitting: Cardiology

## 2022-10-04 DIAGNOSIS — Z955 Presence of coronary angioplasty implant and graft: Secondary | ICD-10-CM | POA: Diagnosis present

## 2022-10-04 NOTE — Progress Notes (Signed)
Daily Session Note  Patient Details  Name: Richard Nolan. MRN: [id number] Date of Birth: 12-05-1955 Referring Provider:   Flowsheet Row CARDIAC REHAB PHASE II ORIENTATION from 10/02/2022 in [facility] CARDIAC REHABILITATION  Referring Provider Dr. Clifton James       Encounter Date: 10/04/2022  Check In:  Session Check In - 10/04/22 0815       Check-In   Supervising physician immediately available to respond to emergencies [facility] MD immediately available    Physician(s) Dr. Jenene Slicker    Location [facility]    Staff Present Ross Ludwig, BS, Exercise Physiologist;Chantrell Apsey, RN;Daphyne Daphine Deutscher, RN, Neal Dy, RN, BSN    Virtual Visit No    Medication changes reported     No    Fall or balance concerns reported    No    Tobacco Cessation No Change    Warm-up and Cool-down Performed as group-led instruction    Resistance Training Performed No    VAD Patient? No    PAD/SET Patient? No      Pain Assessment   Currently in Pain? No/denies    Multiple Pain Sites No             Capillary Blood Glucose: No results found for this or any previous visit (from the past 24 hour(s)).    Social History   Tobacco Use  Smoking Status Never  Smokeless Tobacco Never    Goals Met:  Independence with exercise equipment Exercise tolerated well No report of concerns or symptoms today Strength training completed today  Goals Unmet:  Not Applicable  Comments: check out @ 9:15am   Dr. Dina Rich is Medical Director for [facility] Cardiac Rehab

## 2022-10-07 ENCOUNTER — Encounter (HOSPITAL_COMMUNITY)
Admission: RE | Admit: 2022-10-07 | Discharge: 2022-10-07 | Disposition: A | Discharge status: Home / Self Care | Payer: Medicare Other | Source: Ambulatory Visit | Attending: Cardiology | Admitting: Cardiology

## 2022-10-07 VITALS — Wt 260.1 lb

## 2022-10-07 DIAGNOSIS — Z955 Presence of coronary angioplasty implant and graft: Secondary | ICD-10-CM | POA: Diagnosis not present

## 2022-10-07 NOTE — Progress Notes (Signed)
Daily Session Note  Patient Details  Name: Richard Nolan. MRN: [id number] Date of Birth: 1956/06/17 Referring Provider:   Flowsheet Row CARDIAC REHAB PHASE II ORIENTATION from 10/02/2022 in [facility] CARDIAC REHABILITATION  Referring Provider Dr. Clifton James       Encounter Date: 10/07/2022  Check In:  Session Check In - 10/07/22 0815       Check-In   Supervising physician immediately available to respond to emergencies [facility] MD immediately available    Physician(s) Dr. Wyline Mood    Location [facility]    Staff Present Ross Ludwig, BS, Exercise Physiologist;Daphyne Daphine Deutscher, RN, BSN;Dalton Debbe Mounts, MS, ACSM-CEP    Virtual Visit No    Medication changes reported     No    Fall or balance concerns reported    No    Tobacco Cessation No Change    Warm-up and Cool-down Performed as group-led instruction    Resistance Training Performed Yes    VAD Patient? No    PAD/SET Patient? No      Pain Assessment   Currently in Pain? No/denies    Multiple Pain Sites No             Capillary Blood Glucose: No results found for this or any previous visit (from the past 24 hour(s)).    Social History   Tobacco Use  Smoking Status Never  Smokeless Tobacco Never    Goals Met:  Independence with exercise equipment Exercise tolerated well No report of concerns or symptoms today Strength training completed today  Goals Unmet:  Not Applicable  Comments: check out 0915   Dr. Dina Rich is Medical Director for [facility] Cardiac Rehab

## 2022-10-09 ENCOUNTER — Encounter (HOSPITAL_COMMUNITY)
Admission: RE | Admit: 2022-10-09 | Discharge: 2022-10-09 | Disposition: A | Discharge status: Home / Self Care | Payer: Medicare Other | Source: Ambulatory Visit | Attending: Cardiology | Admitting: Cardiology

## 2022-10-09 DIAGNOSIS — Z955 Presence of coronary angioplasty implant and graft: Secondary | ICD-10-CM | POA: Diagnosis not present

## 2022-10-09 NOTE — Progress Notes (Signed)
Daily Session Note  Patient Details  Name: Richard Nolan. MRN: [id number] Date of Birth: 1956/02/22 Referring Provider:   Flowsheet Row CARDIAC REHAB PHASE II ORIENTATION from 10/02/2022 in [facility] CARDIAC REHABILITATION  Referring Provider Dr. Clifton James       Encounter Date: 10/09/2022  Check In:  Session Check In - 10/09/22 0815       Check-In   Supervising physician immediately available to respond to emergencies [facility] MD immediately available    Physician(s) Dr. Wyline Mood    Location [facility]    Staff Present Ross Ludwig, BS, Exercise Physiologist;Claudie Rathbone Laural Benes, RN, Pleas Koch, RN, BSN    Virtual Visit No    Medication changes reported     No    Fall or balance concerns reported    No    Tobacco Cessation No Change    Warm-up and Cool-down Performed as group-led instruction    Resistance Training Performed Yes    VAD Patient? No    PAD/SET Patient? No      Pain Assessment   Currently in Pain? No/denies    Multiple Pain Sites No             Capillary Blood Glucose: No results found for this or any previous visit (from the past 24 hour(s)).    Social History   Tobacco Use  Smoking Status Never  Smokeless Tobacco Never    Goals Met:  Independence with exercise equipment Exercise tolerated well No report of concerns or symptoms today Strength training completed today  Goals Unmet:  Not Applicable  Comments: Check out 915.   Dr. Dina Rich is Medical Director for [facility] Cardiac Rehab

## 2022-10-11 ENCOUNTER — Encounter (HOSPITAL_COMMUNITY)
Admission: RE | Admit: 2022-10-11 | Discharge: 2022-10-11 | Disposition: A | Discharge status: Home / Self Care | Payer: Medicare Other | Source: Ambulatory Visit | Attending: Cardiology | Admitting: Cardiology

## 2022-10-11 DIAGNOSIS — Z955 Presence of coronary angioplasty implant and graft: Secondary | ICD-10-CM | POA: Diagnosis not present

## 2022-10-11 NOTE — Progress Notes (Signed)
Daily Session Note  Patient Details  Name: Richard Nolan. MRN: [id number] Date of Birth: [birth year] Referring Provider:   Flowsheet Row CARDIAC REHAB PHASE II ORIENTATION from 10/02/2022 in [facility] CARDIAC REHABILITATION  Referring Provider Dr. Clifton James       Encounter Date: 10/11/2022  Check In:  Session Check In - 10/11/22 0815       Check-In   Supervising physician immediately available to respond to emergencies [facility] immediately available    Physician(s) Dr. Wyline Mood    Location [facility]    Staff Present Ross Ludwig, BS, Exercise Physiologist;Julaine Zimny Laural Benes, RN, Pleas Koch, RN, BSN    Virtual Visit No    Medication changes reported     No    Fall or balance concerns reported    No    Tobacco Cessation No Change    Warm-up and Cool-down Performed as group-led instruction    Resistance Training Performed Yes    VAD Patient? No    PAD/SET Patient? No      Pain Assessment   Currently in Pain? No/denies    Multiple Pain Sites No             Capillary Blood Glucose: No results found for this or any previous visit (from the past 24 hour(s)).    Social History   Tobacco Use  Smoking Status Never  Smokeless Tobacco Never    Goals Met:  Independence with exercise equipment Exercise tolerated well No report of concerns or symptoms today Strength training completed today  Goals Unmet:  Not Applicable  Comments: Check out 915.   Dr. Dina Rich is Medical Director for [facility] Cardiac Rehab

## 2022-10-14 ENCOUNTER — Ambulatory Visit: Payer: Medicare Other | Admitting: Cardiology

## 2022-10-14 ENCOUNTER — Encounter (HOSPITAL_COMMUNITY)
Admission: RE | Admit: 2022-10-14 | Discharge: 2022-10-14 | Disposition: A | Discharge status: Home / Self Care | Payer: Medicare Other | Source: Ambulatory Visit | Attending: Cardiology | Admitting: Cardiology

## 2022-10-14 DIAGNOSIS — Z955 Presence of coronary angioplasty implant and graft: Secondary | ICD-10-CM | POA: Diagnosis not present

## 2022-10-14 NOTE — Progress Notes (Signed)
Daily Session Note  Patient Details  Name: Richard Nolan. MRN: [id number] Date of Birth: 11/05/1955 Referring Provider:   Flowsheet Row CARDIAC REHAB PHASE II ORIENTATION from 10/02/2022 in [facility] CARDIAC REHABILITATION  Referring Provider Dr. Clifton James       Encounter Date: 10/14/2022  Check In:  Session Check In - 10/14/22 0815       Check-In   Supervising physician immediately available to respond to emergencies [facility] immediately available    Physician(s) Dr. Diona Browner    Location [facility]    Staff Present Ross Ludwig, BS, Exercise Physiologist;Debra Laural Benes, RN, Pleas Koch, RN, BSN;Brycelyn Gambino, RN    Virtual Visit No    Medication changes reported     No    Fall or balance concerns reported    No    Tobacco Cessation No Change    Warm-up and Cool-down Performed as group-led instruction    Resistance Training Performed Yes    VAD Patient? No    PAD/SET Patient? No      Pain Assessment   Currently in Pain? No/denies    Multiple Pain Sites No             Capillary Blood Glucose: No results found for this or any previous visit (from the past 24 hour(s)).    Social History   Tobacco Use  Smoking Status Never  Smokeless Tobacco Never    Goals Met:  Independence with exercise equipment Exercise tolerated well No report of concerns or symptoms today Strength training completed today  Goals Unmet:  Not Applicable  Comments: check out @ 9:15am   Dr. Dina Rich is Medical Director for [facility] Cardiac Rehab

## 2022-10-16 ENCOUNTER — Encounter (HOSPITAL_COMMUNITY)
Admission: RE | Admit: 2022-10-16 | Discharge: 2022-10-16 | Disposition: A | Discharge status: Home / Self Care | Payer: Medicare Other | Source: Ambulatory Visit | Attending: Cardiology | Admitting: Cardiology

## 2022-10-16 DIAGNOSIS — Z955 Presence of coronary angioplasty implant and graft: Secondary | ICD-10-CM | POA: Diagnosis not present

## 2022-10-16 NOTE — Progress Notes (Signed)
Daily Session Note  Patient Details  Name: Richard Nolan. MRN: [id number] Date of Birth: 09-21-1955 Referring Provider:   Flowsheet Row CARDIAC REHAB PHASE II ORIENTATION from 10/02/2022 in [facility] CARDIAC REHABILITATION  Referring Provider Dr. Clifton James       Encounter Date: 10/16/2022  Check In:  Session Check In - 10/16/22 0813       Check-In   Supervising physician immediately available to respond to emergencies [facility] immediately available    Physician(s) Dr. Diona Browner    Location [facility]    Staff Present Ross Ludwig, BS, Exercise Physiologist;Suzi Hernan Laural Benes, RN, BSN;Hillary Troutman BSN, RN    Virtual Visit No    Medication changes reported     No    Fall or balance concerns reported    No    Tobacco Cessation No Change    Warm-up and Cool-down Performed as group-led Writer Performed Yes    VAD Patient? No    PAD/SET Patient? No      Pain Assessment   Currently in Pain? Yes    Pain Score 7     Pain Location Finger (Comment which one)    Pain Orientation Right    Pain Descriptors / Indicators Sharp    Pain Type Chronic pain    Pain Onset More than a month ago    Pain Frequency Constant    Aggravating Factors  None verbalized.    Multiple Pain Sites No             Capillary Blood Glucose: No results found for this or any previous visit (from the past 24 hour(s)).    Social History   Tobacco Use  Smoking Status Never  Smokeless Tobacco Never    Goals Met:  Independence with exercise equipment Exercise tolerated well No report of concerns or symptoms today Strength training completed today  Goals Unmet:  Not Applicable  Comments: Check out 915.   Dr. Dina Rich is Medical Director for [facility] Cardiac Rehab

## 2022-10-18 ENCOUNTER — Encounter (HOSPITAL_COMMUNITY)
Admission: RE | Admit: 2022-10-18 | Discharge: 2022-10-18 | Disposition: A | Discharge status: Home / Self Care | Payer: Medicare Other | Source: Ambulatory Visit | Attending: Cardiology | Admitting: Cardiology

## 2022-10-18 DIAGNOSIS — Z955 Presence of coronary angioplasty implant and graft: Secondary | ICD-10-CM

## 2022-10-18 NOTE — Progress Notes (Signed)
Daily Session Note  Patient Details  Name: Richard Nolan. MRN: [id number] Date of Birth: 11/26/1955 Referring Provider:   Flowsheet Row CARDIAC REHAB PHASE II ORIENTATION from 10/02/2022 in [facility] CARDIAC REHABILITATION  Referring Provider Dr. Clifton James       Encounter Date: 10/18/2022  Check In:  Session Check In - 10/18/22 0815       Check-In   Supervising physician immediately available to respond to emergencies [facility] immediately available    Physician(s) Dr. Diona Browner    Location [facility]    Staff Present Ross Ludwig, BS, Exercise Physiologist;Debra Laural Benes, RN, BSN;Other    Virtual Visit No    Medication changes reported     No    Fall or balance concerns reported    No    Tobacco Cessation No Change    Warm-up and Cool-down Performed as group-led instruction    Resistance Training Performed Yes    VAD Patient? No    PAD/SET Patient? No      Pain Assessment   Currently in Pain? No/denies    Pain Score 0-No pain    Multiple Pain Sites No             Capillary Blood Glucose: No results found for this or any previous visit (from the past 24 hour(s)).    Social History   Tobacco Use  Smoking Status Never  Smokeless Tobacco Never    Goals Met:  Independence with exercise equipment Exercise tolerated well No report of concerns or symptoms today Strength training completed today  Goals Unmet:  Not Applicable  Comments: check out 0915   Dr. Dina Rich is Medical Director for [facility] Cardiac Rehab

## 2022-10-21 ENCOUNTER — Encounter (HOSPITAL_COMMUNITY)
Admission: RE | Admit: 2022-10-21 | Discharge: 2022-10-21 | Disposition: A | Discharge status: Home / Self Care | Payer: Medicare Other | Source: Ambulatory Visit | Attending: Cardiology | Admitting: Cardiology

## 2022-10-21 VITALS — Wt 257.9 lb

## 2022-10-21 DIAGNOSIS — Z955 Presence of coronary angioplasty implant and graft: Secondary | ICD-10-CM

## 2022-10-21 NOTE — Progress Notes (Signed)
Daily Session Note  Patient Details  Name: Richard Nolan. MRN: [id number] Date of Birth: July 11, 1955 Referring Provider:   Flowsheet Row CARDIAC REHAB PHASE II ORIENTATION from 10/02/2022 in [facility] CARDIAC REHABILITATION  Referring Provider Dr. Clifton James       Encounter Date: 10/21/2022  Check In:  Session Check In - 10/21/22 0815       Check-In   Supervising physician immediately available to respond to emergencies [facility] immediately available    Physician(s) Dr. Jenene Slicker    Location [facility]    Staff Present Ross Ludwig, BS, Exercise Physiologist;Debra Laural Benes, RN, BSN;Other    Virtual Visit No    Medication changes reported     No    Fall or balance concerns reported    No    Tobacco Cessation No Change    Warm-up and Cool-down Performed as group-led instruction    Resistance Training Performed Yes    VAD Patient? No    PAD/SET Patient? No      Pain Assessment   Currently in Pain? No/denies    Pain Score 0-No pain    Multiple Pain Sites No             Capillary Blood Glucose: No results found for this or any previous visit (from the past 24 hour(s)).    Social History   Tobacco Use  Smoking Status Never  Smokeless Tobacco Never    Goals Met:  Independence with exercise equipment Exercise tolerated well No report of concerns or symptoms today Strength training completed today  Goals Unmet:  Not Applicable  Comments: check out 0915   Dr. Dina Rich is Medical Director for [facility] Cardiac Rehab

## 2022-10-23 ENCOUNTER — Encounter (HOSPITAL_COMMUNITY)
Admission: RE | Admit: 2022-10-23 | Discharge: 2022-10-23 | Disposition: A | Discharge status: Home / Self Care | Payer: Medicare Other | Source: Ambulatory Visit | Attending: Cardiology | Admitting: Cardiology

## 2022-10-23 DIAGNOSIS — Z955 Presence of coronary angioplasty implant and graft: Secondary | ICD-10-CM | POA: Diagnosis not present

## 2022-10-23 NOTE — Progress Notes (Signed)
Daily Session Note  Patient Details  Name: Richard Nolan. MRN: [id number] Date of Birth: 1956/01/12 Referring Provider:   Flowsheet Row CARDIAC REHAB PHASE II ORIENTATION from 10/02/2022 in [facility] CARDIAC REHABILITATION  Referring Provider Dr. Clifton James       Encounter Date: 10/23/2022  Check In:  Session Check In - 10/23/22 0815       Check-In   Supervising physician immediately available to respond to emergencies [facility] immediately available    Physician(s) Dr. Jenene Slicker    Location [facility]    Staff Present Ross Ludwig, BS, Exercise Physiologist;Debra Laural Benes, RN, BSN;Christy Edwards, RN, BSN    Virtual Visit No    Medication changes reported     No    Fall or balance concerns reported    No    Tobacco Cessation No Change    Warm-up and Cool-down Performed as group-led Writer Performed Yes    VAD Patient? No    PAD/SET Patient? No      Pain Assessment   Currently in Pain? No/denies    Pain Score 0-No pain    Multiple Pain Sites No             Capillary Blood Glucose: No results found for this or any previous visit (from the past 24 hour(s)).    Social History   Tobacco Use  Smoking Status Never  Smokeless Tobacco Never    Goals Met:  Independence with exercise equipment Personal goals reviewed No report of concerns or symptoms today Strength training completed today  Goals Unmet:  Not Applicable  Comments: check out 0915   Dr. Dina Rich is Medical Director for [facility] Cardiac Rehab

## 2022-10-23 NOTE — Progress Notes (Signed)
Cardiac Individual Treatment Plan  Patient Details  Name: Richard Nolan. MRN: [id number] Date of Birth: [birth year] Referring Provider:   Flowsheet Row CARDIAC REHAB PHASE II ORIENTATION from 10/02/2022 in [facility]  Referring Provider Dr. Angelena Form       Initial Encounter Date:  Flowsheet Row CARDIAC REHAB PHASE II ORIENTATION from 10/02/2022 in [facility]  Date 10/02/22       Visit Diagnosis: Status post coronary artery stent placement  Patient's Home Medications on Admission:  Current Outpatient Medications:    allopurinol (ZYLOPRIM) 100 MG tablet, Take 100 mg by mouth in the morning., Disp: , Rfl:    aspirin EC 81 MG tablet, Take 81 mg by mouth in the morning., Disp: , Rfl:    calcium carbonate (TUMS - DOSED IN MG ELEMENTAL CALCIUM) 500 MG chewable tablet, Chew 1 tablet by mouth daily as needed for indigestion or heartburn., Disp: , Rfl:    clopidogrel (PLAVIX) 75 MG tablet, Take 1 tablet (75 mg total) by mouth daily., Disp: 90 tablet, Rfl: 1   metoprolol succinate (TOPROL XL) 25 MG 24 hr tablet, Take 1 tablet (25 mg total) by mouth daily., Disp: 30 tablet, Rfl: 11   nitroGLYCERIN (NITROSTAT) 0.4 MG SL tablet, Place 1 tablet (0.4 mg total) under the tongue every 5 (five) minutes as needed for chest pain., Disp: 25 tablet, Rfl: 1   rosuvastatin (CRESTOR) 20 MG tablet, Take 20 mg by mouth at bedtime., Disp: , Rfl:   Past Medical History: Past Medical History:  Diagnosis Date   Aortic dilatation ([place]) 08/2022   78mm   CAD (coronary artery disease) 08/29/2022   DES 1st dx   Hyperlipidemia    Hypertension    OSA (obstructive sleep apnea)    CPAP nightly   Stenosing tenosynovitis of finger of left hand    left index and left middle fingers    Tobacco Use: Social History   Tobacco Use  Smoking Status Never  Smokeless Tobacco Never    Labs: Review Flowsheet       Latest Ref Rng & Units 08/15/2022  Labs for ITP Cardiac  and Pulmonary Rehab  Cholestrol 100 - 199 mg/dL 101   LDL (calc) 0 - 99 mg/dL 42   HDL-C >39 mg/dL 42   Trlycerides 0 - 149 mg/dL 87     Capillary Blood Glucose: No results found for: "GLUCAP"   Exercise Target Goals: Exercise Program Goal: Individual exercise prescription set using results from initial 6 min walk test and THRR while considering  patient's activity barriers and safety.   Exercise Prescription Goal: Starting with aerobic activity 30 plus minutes a day, 3 days per week for initial exercise prescription. Provide home exercise prescription and guidelines that participant acknowledges understanding prior to discharge.  Activity Barriers & Risk Stratification:  Activity Barriers & Cardiac Risk Stratification - 10/02/22 1257       Activity Barriers & Cardiac Risk Stratification   Activity Barriers Arthritis;Back Problems;Neck/Spine Problems;Joint Problems;Deconditioning;Shortness of Breath    Cardiac Risk Stratification High             6 Minute Walk:  6 Minute Walk     Row Name 10/02/22 1404         6 Minute Walk   Phase Initial     Distance 1500 feet     Walk Time 6 minutes     # of Rest Breaks 0     MPH 2.84  METS 2.62     RPE 11     VO2 Peak 9.18     Symptoms No     Resting HR 65 bpm     Resting BP 126/80     Resting Oxygen Saturation  96 %     Exercise Oxygen Saturation  during 6 min walk 94 %     Max Ex. HR 82 bpm     Max Ex. BP 140/80     2 Minute Post BP 120/70              Oxygen Initial Assessment:   Oxygen Re-Evaluation:   Oxygen Discharge (Final Oxygen Re-Evaluation):   Initial Exercise Prescription:  Initial Exercise Prescription - 10/02/22 1400       Date of Initial Exercise RX and Referring Provider   Date 10/02/22    Referring Provider Dr. Clifton James    Expected Discharge Date 12/25/22      Treadmill   MPH 2    Grade 0    Minutes 17      Recumbant Elliptical   Level 1    RPM 45    Minutes 22       Prescription Details   Frequency (times per week) 3    Duration Progress to 30 minutes of continuous aerobic without signs/symptoms of physical distress      Intensity   THRR 40-80% of Max Heartrate 61-122    Ratings of Perceived Exertion 11-13      Resistance Training   Training Prescription Yes    Weight 3    Reps 10-15             Perform Capillary Blood Glucose checks as needed.  Exercise Prescription Changes:   Exercise Prescription Changes     Row Name 10/07/22 1000 10/21/22 1300           Response to Exercise   Blood Pressure (Admit) 134/62 116/56      Blood Pressure (Exercise) 132/70 136/60      Blood Pressure (Exit) 142/60 126/60      Heart Rate (Admit) 71 bpm 67 bpm      Heart Rate (Exercise) 110 bpm 104 bpm      Heart Rate (Exit) 82 bpm 77 bpm      Rating of Perceived Exertion (Exercise) 12 11      Duration Continue with 30 min of aerobic exercise without signs/symptoms of physical distress. Continue with 30 min of aerobic exercise without signs/symptoms of physical distress.      Intensity THRR unchanged THRR unchanged        Progression   Progression Continue to progress workloads to maintain intensity without signs/symptoms of physical distress. Continue to progress workloads to maintain intensity without signs/symptoms of physical distress.        Resistance Training   Training Prescription Yes Yes      Weight 4 4      Reps 10-15 10-15      Time 10 Minutes 10 Minutes        Treadmill   MPH 2.4 2.4      Grade 0 1      Minutes 22 17      METs 2.84 3.17        Recumbant Elliptical   Level 2 5      RPM 65 60      Minutes 17 22      METs 2.8 5.3  Exercise Comments:   Exercise Goals and Review:   Exercise Goals     Row Name 10/02/22 1408 10/21/22 1323           Exercise Goals   Increase Physical Activity Yes Yes      Intervention Provide advice, education, support and counseling about physical activity/exercise  needs.;Develop an individualized exercise prescription for aerobic and resistive training based on initial evaluation findings, risk stratification, comorbidities and participant's personal goals. Provide advice, education, support and counseling about physical activity/exercise needs.;Develop an individualized exercise prescription for aerobic and resistive training based on initial evaluation findings, risk stratification, comorbidities and participant's personal goals.      Expected Outcomes Short Term: Attend rehab on a regular basis to increase amount of physical activity.;Long Term: Add in home exercise to make exercise part of routine and to increase amount of physical activity.;Long Term: Exercising regularly at least 3-5 days a week. Short Term: Attend rehab on a regular basis to increase amount of physical activity.;Long Term: Add in home exercise to make exercise part of routine and to increase amount of physical activity.;Long Term: Exercising regularly at least 3-5 days a week.      Increase Strength and Stamina Yes Yes      Intervention Provide advice, education, support and counseling about physical activity/exercise needs.;Develop an individualized exercise prescription for aerobic and resistive training based on initial evaluation findings, risk stratification, comorbidities and participant's personal goals. Provide advice, education, support and counseling about physical activity/exercise needs.;Develop an individualized exercise prescription for aerobic and resistive training based on initial evaluation findings, risk stratification, comorbidities and participant's personal goals.      Expected Outcomes Short Term: Increase workloads from initial exercise prescription for resistance, speed, and METs.;Short Term: Perform resistance training exercises routinely during rehab and add in resistance training at home;Long Term: Improve cardiorespiratory fitness, muscular endurance and strength as  measured by increased METs and functional capacity ( ) Short Term: Increase workloads from initial exercise prescription for resistance, speed, and METs.;Short Term: Perform resistance training exercises routinely during rehab and add in resistance training at home;Long Term: Improve cardiorespiratory fitness, muscular endurance and strength as measured by increased METs and functional capacity ( )      Able to understand and use rate of perceived exertion (RPE) scale Yes Yes      Intervention Provide education and explanation on how to use RPE scale Provide education and explanation on how to use RPE scale      Expected Outcomes Short Term: Able to use RPE daily in rehab to express subjective intensity level;Long Term:  Able to use RPE to guide intensity level when exercising independently Short Term: Able to use RPE daily in rehab to express subjective intensity level;Long Term:  Able to use RPE to guide intensity level when exercising independently      Knowledge and understanding of Target Heart Rate Range (THRR) Yes Yes      Intervention Provide education and explanation of THRR including how the numbers were predicted and where they are located for reference Provide education and explanation of THRR including how the numbers were predicted and where they are located for reference      Expected Outcomes Short Term: Able to state/look up THRR;Long Term: Able to use THRR to govern intensity when exercising independently;Short Term: Able to use daily as guideline for intensity in rehab Short Term: Able to state/look up THRR;Long Term: Able to use THRR to govern intensity when exercising independently;Short Term: Able to use daily  as guideline for intensity in rehab      Able to check pulse independently Yes Yes      Intervention Provide education and demonstration on how to check pulse in carotid and radial arteries.;Review the importance of being able to check your own pulse for safety during  independent exercise Provide education and demonstration on how to check pulse in carotid and radial arteries.;Review the importance of being able to check your own pulse for safety during independent exercise      Expected Outcomes Long Term: Able to check pulse independently and accurately;Short Term: Able to explain why pulse checking is important during independent exercise Long Term: Able to check pulse independently and accurately;Short Term: Able to explain why pulse checking is important during independent exercise      Understanding of Exercise Prescription Yes Yes      Intervention Provide education, explanation, and written materials on patient's individual exercise prescription Provide education, explanation, and written materials on patient's individual exercise prescription      Expected Outcomes Short Term: Able to explain program exercise prescription;Long Term: Able to explain home exercise prescription to exercise independently Short Term: Able to explain program exercise prescription;Long Term: Able to explain home exercise prescription to exercise independently               Exercise Goals Re-Evaluation :  Exercise Goals Re-Evaluation     Row Name 10/21/22 1324             Exercise Goal Re-Evaluation   Exercise Goals Review Increase Physical Activity;Increase Strength and Stamina;Able to understand and use rate of perceived exertion (RPE) scale;Knowledge and understanding of Target Heart Rate Range (THRR);Able to check pulse independently;Understanding of Exercise Prescription       Comments Pt has completed 9 sessions of cardiac rehab. He is tolerating exercise well and has started to increase his workload. He is currently exercising at 5.3 METs on the ellp. Will continue to monitor and progress as able.       Expected Outcomes Throught exercise at rehab and home, the patients will meet their stated goals.                 Discharge Exercise Prescription (Final  Exercise Prescription Changes):  Exercise Prescription Changes - 10/21/22 1300       Response to Exercise   Blood Pressure (Admit) 116/56    Blood Pressure (Exercise) 136/60    Blood Pressure (Exit) 126/60    Heart Rate (Admit) 67 bpm    Heart Rate (Exercise) 104 bpm    Heart Rate (Exit) 77 bpm    Rating of Perceived Exertion (Exercise) 11    Duration Continue with 30 min of aerobic exercise without signs/symptoms of physical distress.    Intensity THRR unchanged      Progression   Progression Continue to progress workloads to maintain intensity without signs/symptoms of physical distress.      Resistance Training   Training Prescription Yes    Weight 4    Reps 10-15    Time 10 Minutes      Treadmill   MPH 2.4    Grade 1    Minutes 17    METs 3.17      Recumbant Elliptical   Level 5    RPM 60    Minutes 22    METs 5.3             Nutrition:  Target Goals: Understanding of nutrition guidelines, daily intake of sodium <  1500mg , cholesterol 200mg , calories 30% from fat and 7% or less from saturated fats, daily to have 5 or more servings of fruits and vegetables.  Biometrics:  Pre Biometrics - 10/02/22 1409       Pre Biometrics   Height 5\' 9"  (1.753 m)    Weight 116.8 kg    Waist Circumference 52 inches    Hip Circumference 49 inches    Waist to Hip Ratio 1.06 %    BMI (Calculated) 38.01    Triceps Skinfold 15 mm    % Body Fat 37 %    Grip Strength 25.7 kg    Flexibility 0 in    Single Leg Stand 20.21 seconds              Nutrition Therapy Plan and Nutrition Goals:  Nutrition Therapy & Goals - 10/02/22 1309       Personal Nutrition Goals   Comments Pt is eating a low sodium diet and trying to stay away from high sugar foods.      Intervention Plan   Intervention Nutrition handout(s) given to patient.;Prescribe, educate and counsel regarding individualized specific dietary modifications aiming towards targeted core components such as weight,  hypertension, lipid management, diabetes, heart failure and other comorbidities.    Expected Outcomes Short Term Goal: Understand basic principles of dietary content, such as calories, fat, sodium, cholesterol and nutrients.;Long Term Goal: Adherence to prescribed nutrition plan.             Nutrition Assessments:  Nutrition Assessments - 10/02/22 1312       MEDFICTS Scores   Pre Score 27            MEDIFICTS Score Key: ?70 Need to make dietary changes  40-70 Heart Healthy Diet ? 40 Therapeutic Level Cholesterol Diet   Picture Your Plate Scores: <16 Unhealthy dietary pattern with much room for improvement. 41-50 Dietary pattern unlikely to meet recommendations for good health and room for improvement. 51-60 More healthful dietary pattern, with some room for improvement.  >60 Healthy dietary pattern, although there may be some specific behaviors that could be improved.    Nutrition Goals Re-Evaluation:   Nutrition Goals Discharge (Final Nutrition Goals Re-Evaluation):   Psychosocial: Target Goals: Acknowledge presence or absence of significant depression and/or stress, maximize coping skills, provide positive support system. Participant is able to verbalize types and ability to use techniques and skills needed for reducing stress and depression.  Initial Review & Psychosocial Screening:  Initial Psych Review & Screening - 10/02/22 1305       Initial Review   Current issues with None Identified      Family Dynamics   Good Support System? Yes    Comments Pt has a good support system with his wife and his 3 children.      Barriers   Psychosocial barriers to participate in program There are no identifiable barriers or psychosocial needs.      Screening Interventions   Interventions Encouraged to exercise;To provide support and resources with identified psychosocial needs;Provide feedback about the scores to participant    Expected Outcomes Short Term goal:  Identification and review with participant of any Quality of Life or Depression concerns found by scoring the questionnaire.;Long Term goal: The participant improves quality of Life and PHQ9 Scores as seen by post scores and/or verbalization of changes             Quality of Life Scores:  Quality of Life - 10/02/22 1412  Quality of Life   Select Quality of Life      Quality of Life Scores   Health/Function Pre 28.4 %    Socioeconomic Pre 30 %    Psych/Spiritual Pre 28.29 %    Family Pre 30 %    GLOBAL Pre 28.94 %            Scores of 19 and below usually indicate a poorer quality of life in these areas.  A difference of  2-3 points is a clinically meaningful difference.  A difference of 2-3 points in the total score of the Quality of Life Index has been associated with significant improvement in overall quality of life, self-image, physical symptoms, and general health in studies assessing change in quality of life.  PHQ-9: Review Flowsheet  More data exists      10/02/2022 05/09/2018 10/06/2017 07/23/2017 07/07/2015  Depression screen PHQ 2/9  Decreased Interest 0 0 0 0 0  Down, Depressed, Hopeless 0 0 0 0 0  PHQ - 2 Score 0 0 0 0 0  Altered sleeping 0 - - - -  Tired, decreased energy 0 - - - -  Change in appetite 0 - - - -  Feeling bad or failure about yourself  0 - - - -  Trouble concentrating 0 - - - -  Moving slowly or fidgety/restless 0 - - - -  Suicidal thoughts 0 - - - -  PHQ-9 Score 0 - - - -  Difficult doing work/chores Not difficult at all - - - -   Interpretation of Total Score  Total Score Depression Severity:  1-4 = Minimal depression, 5-9 = Mild depression, 10-14 = Moderate depression, 15-19 = Moderately severe depression, 20-27 = Severe depression   Psychosocial Evaluation and Intervention:  Psychosocial Evaluation - 10/02/22 1334       Psychosocial Evaluation & Interventions   Comments Pt has no identifiable psychosocial barriers.  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  His PHQ-9 was a zero.  His goals for the program are to increase his strength/stamina and be able to breathe better.  He also wants to be able to play with his grandchildren.  We will continue to monitor his progress as he works toward these goals.    Expected Outcomes Pt will continue to have no identifiable psychosocial barriers.    Continue Psychosocial Services  No Follow up required             Psychosocial Re-Evaluation:  Psychosocial Re-Evaluation     Row Name 10/14/22 1512             Psychosocial Re-Evaluation   Current issues with None Identified       Comments Patient is new to the program. He has completed 5 sessions. He continues to have no psychosocial barriers identified. He seems to enjoy the sessions and demonstrates an interest in improving his health. We will continue to monitor his progress.       Expected Outcomes Patient will complete the program meeting both personal and program goals.       Interventions Stress management education;Encouraged to attend Cardiac Rehabilitation for the exercise;Relaxation education       Continue Psychosocial Services  No Follow up required                Psychosocial Discharge (Final Psychosocial Re-Evaluation):  Psychosocial Re-Evaluation - 10/14/22 1512  Psychosocial Re-Evaluation   Current issues with None Identified    Comments Patient is new to the program. He has completed 5 sessions. He continues to have no psychosocial barriers identified. He seems to enjoy the sessions and demonstrates an interest in improving his health. We will continue to monitor his progress.    Expected Outcomes Patient will complete the program meeting both personal and program goals.    Interventions Stress management education;Encouraged to attend Cardiac Rehabilitation for the exercise;Relaxation education    Continue Psychosocial Services  No Follow  up required             Vocational Rehabilitation: Provide vocational rehab assistance to qualifying candidates.   Vocational Rehab Evaluation & Intervention:  Vocational Rehab - 10/02/22 1320       Initial Vocational Rehab Evaluation & Intervention   Assessment shows need for Vocational Rehabilitation No      Vocational Rehab Re-Evaulation   Comments pt is retired.             Education: Education Goals: Education classes will be provided on a weekly basis, covering required topics. Participant will state understanding/return demonstration of topics presented.  Learning Barriers/Preferences:  Learning Barriers/Preferences - 10/02/22 1315       Learning Barriers/Preferences   Learning Barriers None    Learning Preferences Written Material;Verbal Instruction             Education Topics: Hypertension, Hypertension Reduction -Define heart disease and high blood pressure. Discus how high blood pressure affects the body and ways to reduce high blood pressure. Flowsheet Row CARDIAC REHAB PHASE II EXERCISE from 10/23/2022 in [place] CARDIAC REHABILITATION  Date 10/09/22  Educator HB  Instruction Review Code 1- Verbalizes Understanding       Exercise and Your Heart -Discuss why it is important to exercise, the FITT principles of exercise, normal and abnormal responses to exercise, and how to exercise safely. Flowsheet Row CARDIAC REHAB PHASE II EXERCISE from 10/23/2022 in [place] CARDIAC REHABILITATION  Date 10/16/22  Educator HB  Instruction Review Code 1- Verbalizes Understanding       Angina -Discuss definition of angina, causes of angina, treatment of angina, and how to decrease risk of having angina. Flowsheet Row CARDIAC REHAB PHASE II EXERCISE from 10/23/2022 in [place] CARDIAC REHABILITATION  Date 10/23/22  Educator HB  Instruction Review Code 2- Demonstrated Understanding       Cardiac Medications -Review what the following cardiac  medications are used for, how they affect the body, and side effects that may occur when taking the medications.  Medications include Aspirin, Beta blockers, calcium channel blockers, ACE Inhibitors, angiotensin receptor blockers, diuretics, digoxin, and antihyperlipidemics.   Congestive Heart Failure -Discuss the definition of CHF, how to live with CHF, the signs and symptoms of CHF, and how keep track of weight and sodium intake.   Heart Disease and Intimacy -Discus the effect sexual activity has on the heart, how changes occur during intimacy as we age, and safety during sexual activity.   Smoking Cessation / COPD -Discuss different methods to quit smoking, the health benefits of quitting smoking, and the definition of COPD.   Nutrition I: Fats -Discuss the types of cholesterol, what cholesterol does to the heart, and how cholesterol levels can be controlled.   Nutrition II: Labels -Discuss the different components of food labels and how to read food label   Heart Parts/Heart Disease and PAD -Discuss the anatomy of the heart, the pathway of blood circulation through  the heart, and these are affected by heart disease.   Stress I: Signs and Symptoms -Discuss the causes of stress, how stress may lead to anxiety and depression, and ways to limit stress.   Stress II: Relaxation -Discuss different types of relaxation techniques to limit stress.   Warning Signs of Stroke / TIA -Discuss definition of a stroke, what the signs and symptoms are of a stroke, and how to identify when someone is having stroke.   Knowledge Questionnaire Score:  Knowledge Questionnaire Score - 10/02/22 1317       Knowledge Questionnaire Score   Pre Score 22/24             Core Components/Risk Factors/Patient Goals at Admission:  Personal Goals and Risk Factors at Admission - 10/02/22 1320       Core Components/Risk Factors/Patient Goals on Admission    Weight Management Yes;Weight Loss     Intervention Obesity: Provide education and appropriate resources to help participant work on and attain dietary goals.;Weight Management/Obesity: Establish reasonable short term and long term weight goals.;Weight Management: Provide education and appropriate resources to help participant work on and attain dietary goals.;Weight Management: Develop a combined nutrition and exercise program designed to reach desired caloric intake, while maintaining appropriate intake of nutrient and fiber, sodium and fats, and appropriate energy expenditure required for the weight goal.    Expected Outcomes Short Term: Continue to assess and modify interventions until short term weight is achieved;Long Term: Adherence to nutrition and physical activity/exercise program aimed toward attainment of established weight goal;Weight Maintenance: Understanding of the daily nutrition guidelines, which includes 25-35% calories from fat, 7% or less cal from saturated fats, less than 200mg  cholesterol, less than 1.5gm of sodium, & 5 or more servings of fruits and vegetables daily;Weight Loss: Understanding of general recommendations for a balanced deficit meal plan, which promotes 1-2 lb weight loss per week and includes a negative energy balance of [phone] kcal/d;Understanding recommendations for meals to include 15-35% energy as protein, 25-35% energy from fat, 35-60% energy from carbohydrates, less than 200mg  of dietary cholesterol, 20-35 gm of total fiber daily;Understanding of distribution of calorie intake throughout the day with the consumption of 4-5 meals/snacks    Improve shortness of breath with ADL's Yes    Intervention Provide education, individualized exercise plan and daily activity instruction to help decrease symptoms of SOB with activities of daily living.    Expected Outcomes Short Term: Improve cardiorespiratory fitness to achieve a reduction of symptoms when performing ADLs;Long Term: Be able to perform more ADLs  without symptoms or delay the onset of symptoms    Hypertension Yes    Intervention Provide education on lifestyle modifcations including regular physical activity/exercise, weight management, moderate sodium restriction and increased consumption of fresh fruit, vegetables, and low fat dairy, alcohol moderation, and smoking cessation.;Monitor prescription use compliance.    Expected Outcomes Short Term: Continued assessment and intervention until BP is < 140/28mm HG in hypertensive participants. < 130/33mm HG in hypertensive participants with diabetes, heart failure or chronic kidney disease.;Long Term: Maintenance of blood pressure at goal levels.    Lipids Yes    Intervention Provide education and support for participant on nutrition & aerobic/resistive exercise along with prescribed medications to achieve LDL 70mg , HDL >40mg .    Expected Outcomes Short Term: Participant states understanding of desired cholesterol values and is compliant with medications prescribed. Participant is following exercise prescription and nutrition guidelines.;Long Term: Cholesterol controlled with medications as prescribed, with individualized exercise RX and with  personalized nutrition plan. Value goals: LDL < , HDL > 40 mg.    Personal Goal Other Yes    Personal Goal Pt wants to be able to breathe better and increase his strength/stamina.    Intervention Pt will attend CR 3 times a week and continue his home exercise program.    Expected Outcomes Pt will meet both the program and his personal goals.             Core Components/Risk Factors/Patient Goals Review:   Goals and Risk Factor Review     Row Name 10/14/22 1514             Core Components/Risk Factors/Patient Goals Review   Personal Goals Review Weight Management/Obesity;Hypertension;Lipids;Improve shortness of breath with ADL's;Other       Review Patient was referred to CR with stent placement. He has multiple risk factors for CAD and is  participating in the program for risk modification. He has completed 5 sessions. His current weight is 259 lbs maintained since his orientation visit. His blood pressure is at goal. His is doing well in the program with consistent attendance and progressions. His personal goals for the program are to increase his strength and stamina; breathe better; and be able to play with his grandchildren. We will continue to monitor his progress as he works towards meeting these goals.       Expected Outcomes Patient will complete the program meeting both personal and program goals.                Core Components/Risk Factors/Patient Goals at Discharge (Final Review):   Goals and Risk Factor Review - 10/14/22 1514       Core Components/Risk Factors/Patient Goals Review   Personal Goals Review Weight Management/Obesity;Hypertension;Lipids;Improve shortness of breath with ADL's;Other    Review Patient was referred to CR with stent placement. He has multiple risk factors for CAD and is participating in the program for risk modification. He has completed 5 sessions. His current weight is 259 lbs maintained since his orientation visit. His blood pressure is at goal. His is doing well in the program with consistent attendance and progressions. His personal goals for the program are to increase his strength and stamina; breathe better; and be able to play with his grandchildren. We will continue to monitor his progress as he works towards meeting these goals.    Expected Outcomes Patient will complete the program meeting both personal and program goals.             ITP Comments:   Comments: ITP REVIEW Pt is making expected progress toward Cardiac Rehab goals after completing 10 sessions. Recommend continued exercise, life style modification, education, and increased stamina and strength.

## 2022-10-25 ENCOUNTER — Encounter (HOSPITAL_COMMUNITY)
Admission: RE | Admit: 2022-10-25 | Discharge: 2022-10-25 | Disposition: A | Discharge status: Home / Self Care | Payer: Medicare Other | Source: Ambulatory Visit | Attending: Cardiology | Admitting: Cardiology

## 2022-10-25 DIAGNOSIS — Z955 Presence of coronary angioplasty implant and graft: Secondary | ICD-10-CM | POA: Diagnosis not present

## 2022-10-25 NOTE — Progress Notes (Signed)
Daily Session Note  Patient Details  Name: Richard Nolan. MRN: [id number] Date of Birth: 1955-11-10 Referring Provider:   Flowsheet Row CARDIAC REHAB PHASE II ORIENTATION from 10/02/2022 in [facility] CARDIAC REHABILITATION  Referring Provider Dr. Clifton James       Encounter Date: 10/25/2022  Check In:  Session Check In - 10/25/22 0815       Check-In   Supervising physician immediately available to respond to emergencies [facility] MD immediately available    Physician(s) Dr. Jenene Slicker    Location [facility]    Staff Present Erskine Speed, RN;Debra Laural Benes, RN, Pleas Koch, RN, BSN;Hillary Troutman BSN, RN    Virtual Visit No    Medication changes reported     No    Fall or balance concerns reported    No    Tobacco Cessation No Change    Warm-up and Cool-down Performed as group-led Writer Performed Yes      Pain Assessment   Currently in Pain? No/denies    Pain Score 0-No pain             Capillary Blood Glucose: No results found for this or any previous visit (from the past 24 hour(s)).    Social History   Tobacco Use  Smoking Status Never  Smokeless Tobacco Never    Goals Met:  Independence with exercise equipment Exercise tolerated well No report of concerns or symptoms today Strength training completed today  Goals Unmet:  Not Applicable  Comments: Checkout at 0915.   Dr. Dina Rich is Medical Director for [facility] Cardiac Rehab

## 2022-10-28 ENCOUNTER — Encounter (HOSPITAL_COMMUNITY)
Admission: RE | Admit: 2022-10-28 | Discharge: 2022-10-28 | Disposition: A | Discharge status: Home / Self Care | Payer: Medicare Other | Source: Ambulatory Visit | Attending: Cardiology | Admitting: Cardiology

## 2022-10-28 DIAGNOSIS — Z955 Presence of coronary angioplasty implant and graft: Secondary | ICD-10-CM | POA: Diagnosis not present

## 2022-10-28 NOTE — Progress Notes (Signed)
Daily Session Note  Patient Details  Name: Richard Nolan. MRN: [id number] Date of Birth: 11-12-1955 Referring Provider:   Flowsheet Row CARDIAC REHAB PHASE II ORIENTATION from 10/02/2022 in [facility] CARDIAC REHABILITATION  Referring Provider Dr. Clifton James       Encounter Date: 10/28/2022  Check In:  Session Check In - 10/28/22 0815       Check-In   Supervising physician immediately available to respond to emergencies [facility] MD immediately available    Physician(s) Dr Wyline Mood    Location [facility]    Staff Present Rodena Medin, RN, Pleas Koch, RN, BSN    Virtual Visit No    Medication changes reported     No    Fall or balance concerns reported    No    Tobacco Cessation No Change    Warm-up and Cool-down Performed as group-led instruction    Resistance Training Performed Yes      Pain Assessment   Currently in Pain? No/denies    Pain Score 0-No pain             Capillary Blood Glucose: No results found for this or any previous visit (from the past 24 hour(s)).    Social History   Tobacco Use  Smoking Status Never  Smokeless Tobacco Never    Goals Met:  Independence with exercise equipment Exercise tolerated well No report of concerns or symptoms today Strength training completed today  Goals Unmet:  Not Applicable  Comments: Checkout at 0915.   Dr. Dina Rich is Medical Director for [facility] Cardiac Rehab

## 2022-10-30 ENCOUNTER — Encounter (HOSPITAL_COMMUNITY)
Admission: RE | Admit: 2022-10-30 | Discharge: 2022-10-30 | Disposition: A | Discharge status: Home / Self Care | Payer: Medicare Other | Source: Ambulatory Visit | Attending: Cardiology | Admitting: Cardiology

## 2022-10-30 DIAGNOSIS — Z955 Presence of coronary angioplasty implant and graft: Secondary | ICD-10-CM | POA: Insufficient documentation

## 2022-10-30 NOTE — Progress Notes (Signed)
Daily Session Note  Patient Details  Name: Richard Nolan. MRN: [id number] Date of Birth: [birth year] Referring Provider:   Flowsheet Row CARDIAC REHAB PHASE II ORIENTATION from 10/02/2022 in [facility] CARDIAC REHABILITATION  Referring Provider Dr. Clifton James       Encounter Date: 10/30/2022  Check In:  Session Check In - 10/30/22 0815       Check-In   Supervising physician immediately available to respond to emergencies [facility] MD immediately available    Physician(s) Dr. Dina Rich    Location [facility]    Staff Present Rodena Medin, RN, BSN;Hillary Troutman BSN, RN;Heather Fredric Mare, BS, Exercise Physiologist    Virtual Visit No    Medication changes reported     No    Fall or balance concerns reported    No    Tobacco Cessation No Change    Warm-up and Cool-down Performed as group-led instruction    Resistance Training Performed Yes    VAD Patient? No    PAD/SET Patient? No      Pain Assessment   Currently in Pain? No/denies    Pain Score 0-No pain    Multiple Pain Sites No             Capillary Blood Glucose: No results found for this or any previous visit (from the past 24 hour(s)).    Social History   Tobacco Use  Smoking Status Never  Smokeless Tobacco Never    Goals Met:  Independence with exercise equipment Exercise tolerated well No report of concerns or symptoms today Strength training completed today  Goals Unmet:  Not Applicable  Comments: Check out 0915.   Dr. Dina Rich is Medical Director for [facility] Cardiac Rehab

## 2022-11-01 ENCOUNTER — Encounter (HOSPITAL_COMMUNITY)
Admission: RE | Admit: 2022-11-01 | Discharge: 2022-11-01 | Disposition: A | Discharge status: Home / Self Care | Payer: Medicare Other | Source: Ambulatory Visit | Attending: Cardiology | Admitting: Cardiology

## 2022-11-01 DIAGNOSIS — Z955 Presence of coronary angioplasty implant and graft: Secondary | ICD-10-CM

## 2022-11-01 NOTE — Progress Notes (Signed)
Daily Session Note  Patient Details  Name: Richard Nolan. MRN: [id number] Date of Birth: [birth year] Referring Provider:   Flowsheet Row CARDIAC REHAB PHASE II ORIENTATION from 10/02/2022 in [facility] CARDIAC REHABILITATION  Referring Provider Dr. Clifton James       Encounter Date: 11/01/2022  Check In:  Session Check In - 11/01/22 0815       Check-In   Supervising physician immediately available to respond to emergencies [facility] MD immediately available    Physician(s) Dr. Dina Rich    Location [facility]    Staff Present Ross Ludwig, BS, Exercise Physiologist;Trenna Kiely Laural Benes, RN, Pleas Koch, RN, BSN    Virtual Visit No    Medication changes reported     No    Fall or balance concerns reported    No    Tobacco Cessation No Change    Warm-up and Cool-down Performed as group-led instruction    Resistance Training Performed Yes    VAD Patient? No    PAD/SET Patient? No      Pain Assessment   Currently in Pain? No/denies    Pain Score 0-No pain    Multiple Pain Sites No             Capillary Blood Glucose: No results found for this or any previous visit (from the past 24 hour(s)).    Social History   Tobacco Use  Smoking Status Never  Smokeless Tobacco Never    Goals Met:  Independence with exercise equipment Exercise tolerated well No report of concerns or symptoms today Strength training completed today  Goals Unmet:  Not Applicable  Comments: Check 915   Dr. Dina Rich is Medical Director for [facility] Cardiac Rehab

## 2022-11-04 ENCOUNTER — Encounter (HOSPITAL_COMMUNITY)
Admission: RE | Admit: 2022-11-04 | Discharge: 2022-11-04 | Disposition: A | Discharge status: Home / Self Care | Payer: Medicare Other | Source: Ambulatory Visit | Attending: Cardiology | Admitting: Cardiology

## 2022-11-04 VITALS — Wt 256.2 lb

## 2022-11-04 DIAGNOSIS — Z955 Presence of coronary angioplasty implant and graft: Secondary | ICD-10-CM | POA: Diagnosis not present

## 2022-11-04 NOTE — Progress Notes (Signed)
Daily Session Note  Patient Details  Name: Richard Nolan. MRN: [id number] Date of Birth: 10-31-1955 Referring Provider:   Flowsheet Row CARDIAC REHAB PHASE II ORIENTATION from 10/02/2022 in [facility] CARDIAC REHABILITATION  Referring Provider Dr. Clifton James       Encounter Date: 11/04/2022  Check In:  Session Check In - 11/04/22 0815       Check-In   Supervising physician immediately available to respond to emergencies [facility] MD immediately available    Physician(s) Dr. Dietrich Pates    Location [facility]    Staff Present Ross Ludwig, BS, Exercise Physiologist;Phyllis Billingsley, RN;Mckoy Bhakta Laural Benes, RN, BSN;Hillary Troutman BSN, RN    Virtual Visit No    Medication changes reported     No    Fall or balance concerns reported    No    Tobacco Cessation No Change    Warm-up and Cool-down Performed as group-led Writer Performed Yes    VAD Patient? No    PAD/SET Patient? No      Pain Assessment   Currently in Pain? No/denies    Pain Score 0-No pain    Multiple Pain Sites No             Capillary Blood Glucose: No results found for this or any previous visit (from the past 24 hour(s)).    Social History   Tobacco Use  Smoking Status Never  Smokeless Tobacco Never    Goals Met:  Independence with exercise equipment Exercise tolerated well No report of concerns or symptoms today Strength training completed today  Goals Unmet:  Not Applicable  Comments: Check out 915.   Dr. Dina Rich is Medical Director for [facility] Cardiac Rehab

## 2022-11-06 ENCOUNTER — Encounter (HOSPITAL_COMMUNITY)
Admission: RE | Admit: 2022-11-06 | Discharge: 2022-11-06 | Disposition: A | Discharge status: Home / Self Care | Payer: Medicare Other | Source: Ambulatory Visit | Attending: Cardiology | Admitting: Cardiology

## 2022-11-06 DIAGNOSIS — Z955 Presence of coronary angioplasty implant and graft: Secondary | ICD-10-CM

## 2022-11-06 NOTE — Progress Notes (Signed)
Daily Session Note  Patient Details  Name: Richard Nolan. MRN: [id number] Date of Birth: [birth year] Referring Provider:   Flowsheet Row CARDIAC REHAB PHASE II ORIENTATION from 10/02/2022 in [facility] CARDIAC REHABILITATION  Referring Provider Dr. Clifton James       Encounter Date: 11/06/2022  Check In:  Session Check In - 11/06/22 0813       Check-In   Supervising physician immediately available to respond to emergencies [facility] MD immediately available    Physician(s) Dr. Rennis Golden    Location [facility]    Staff Present Ross Ludwig, BS, Exercise Physiologist;Hillary Troutman BSN, RN;Danitra Payano Laural Benes, RN, BSN    Virtual Visit No    Medication changes reported     No    Fall or balance concerns reported    No    Tobacco Cessation No Change    Warm-up and Cool-down Performed as group-led instruction    Resistance Training Performed Yes    VAD Patient? No    PAD/SET Patient? No      Pain Assessment   Currently in Pain? No/denies    Pain Score 0-No pain    Multiple Pain Sites No             Capillary Blood Glucose: No results found for this or any previous visit (from the past 24 hour(s)).    Social History   Tobacco Use  Smoking Status Never  Smokeless Tobacco Never    Goals Met:  Independence with exercise equipment Exercise tolerated well No report of concerns or symptoms today Strength training completed today  Goals Unmet:  Not Applicable  Comments: Check out 915.   Dr. Dina Rich is Medical Director for [facility] Cardiac Rehab

## 2022-11-08 ENCOUNTER — Encounter (HOSPITAL_COMMUNITY)
Admission: RE | Admit: 2022-11-08 | Discharge: 2022-11-08 | Disposition: A | Discharge status: Home / Self Care | Payer: Medicare Other | Source: Ambulatory Visit | Attending: Cardiology | Admitting: Cardiology

## 2022-11-08 DIAGNOSIS — Z955 Presence of coronary angioplasty implant and graft: Secondary | ICD-10-CM

## 2022-11-08 NOTE — Progress Notes (Signed)
Daily Session Note  Patient Details  Name: Richard Nolan. MRN: [id number] Date of Birth: 1955/09/21 Referring Provider:   Flowsheet Row CARDIAC REHAB PHASE II ORIENTATION from 10/02/2022 in [facility] CARDIAC REHABILITATION  Referring Provider Dr. Clifton James       Encounter Date: 11/08/2022  Check In:  Session Check In - 11/08/22 0815       Check-In   Supervising physician immediately available to respond to emergencies [facility] MD immediately available    Physician(s) Dr. Diona Browner    Location [facility]    Staff Present Ross Ludwig, BS, Exercise Physiologist;Cordney Barstow Laural Benes, RN, Pleas Koch, RN, BSN;Phyllis Billingsley, RN    Virtual Visit No    Medication changes reported     No    Fall or balance concerns reported    No    Tobacco Cessation No Change    Warm-up and Cool-down Performed as group-led instruction    Resistance Training Performed Yes    VAD Patient? No    PAD/SET Patient? No      Pain Assessment   Currently in Pain? No/denies    Pain Score 0-No pain    Multiple Pain Sites No             Capillary Blood Glucose: No results found for this or any previous visit (from the past 24 hour(s)).    Social History   Tobacco Use  Smoking Status Never  Smokeless Tobacco Never    Goals Met:  Independence with exercise equipment Exercise tolerated well No report of concerns or symptoms today Strength training completed today  Goals Unmet:  Not Applicable  Comments: Check 915.   Dr. Dina Rich is Medical Director for [facility] Cardiac Rehab

## 2022-11-11 ENCOUNTER — Encounter (HOSPITAL_COMMUNITY)
Admission: RE | Admit: 2022-11-11 | Discharge: 2022-11-11 | Disposition: A | Discharge status: Home / Self Care | Payer: Medicare Other | Source: Ambulatory Visit | Attending: Cardiology | Admitting: Cardiology

## 2022-11-11 DIAGNOSIS — Z955 Presence of coronary angioplasty implant and graft: Secondary | ICD-10-CM | POA: Diagnosis not present

## 2022-11-11 NOTE — Progress Notes (Signed)
Daily Session Note  Patient Details  Name: Richard Nolan. MRN: [id number] Date of Birth: [birth year] Referring Provider:   Flowsheet Row CARDIAC REHAB PHASE II ORIENTATION from 10/02/2022 in [facility] CARDIAC REHABILITATION  Referring Provider Dr. Clifton James       Encounter Date: 11/11/2022  Check In:  Session Check In - 11/11/22 0815       Check-In   Supervising physician immediately available to respond to emergencies [facility] MD immediately available    Physician(s) Dr. Jenene Slicker    Location [facility]    Staff Present Ross Ludwig, BS, Exercise Physiologist;Dalton Debbe Mounts, MS, ACSM-CEP;Varick Keys Laural Benes, RN, BSN    Virtual Visit No    Medication changes reported     No    Fall or balance concerns reported    No    Tobacco Cessation No Change    Warm-up and Cool-down Performed as group-led instruction    Resistance Training Performed Yes    VAD Patient? No    PAD/SET Patient? No      Pain Assessment   Currently in Pain? No/denies    Pain Score 0-No pain    Multiple Pain Sites No             Capillary Blood Glucose: No results found for this or any previous visit (from the past 24 hour(s)).    Social History   Tobacco Use  Smoking Status Never  Smokeless Tobacco Never    Goals Met:  Independence with exercise equipment Exercise tolerated well No report of concerns or symptoms today Strength training completed today  Goals Unmet:  Not Applicable  Comments: Check out 915.   Dr. Dina Rich is Medical Director for [facility] Cardiac Rehab

## 2022-11-13 ENCOUNTER — Encounter (HOSPITAL_COMMUNITY)
Admission: RE | Admit: 2022-11-13 | Discharge: 2022-11-13 | Disposition: A | Discharge status: Home / Self Care | Payer: Medicare Other | Source: Ambulatory Visit | Attending: Cardiology | Admitting: Cardiology

## 2022-11-13 DIAGNOSIS — Z955 Presence of coronary angioplasty implant and graft: Secondary | ICD-10-CM

## 2022-11-13 NOTE — Progress Notes (Signed)
Daily Session Note  Patient Details  Name: Richard Nolan. MRN: [id number] Date of Birth: 1956/04/08 Referring Provider:   Flowsheet Row CARDIAC REHAB PHASE II ORIENTATION from 10/02/2022 in [facility] CARDIAC REHABILITATION  Referring Provider Dr. Clifton James       Encounter Date: 11/13/2022  Check In:  Session Check In - 11/13/22 0815       Check-In   Supervising physician immediately available to respond to emergencies [facility] MD immediately available    Physician(s) Dr. Jenene Slicker    Location [facility]    Staff Present Ross Ludwig, BS, Exercise Physiologist;Dalton Debbe Mounts, MS, ACSM-CEP    Virtual Visit No    Medication changes reported     No    Fall or balance concerns reported    No    Tobacco Cessation No Change    Warm-up and Cool-down Performed as group-led instruction    Resistance Training Performed Yes    VAD Patient? No    PAD/SET Patient? No      Pain Assessment   Currently in Pain? No/denies    Pain Score 0-No pain    Multiple Pain Sites No             Capillary Blood Glucose: No results found for this or any previous visit (from the past 24 hour(s)).    Social History   Tobacco Use  Smoking Status Never  Smokeless Tobacco Never    Goals Met:  Independence with exercise equipment Exercise tolerated well No report of concerns or symptoms today Strength training completed today  Goals Unmet:  Not Applicable  Comments: check out 0915   Dr. Dina Rich is Medical Director for [facility] Cardiac Rehab

## 2022-11-15 ENCOUNTER — Encounter (HOSPITAL_COMMUNITY)
Admission: RE | Admit: 2022-11-15 | Discharge: 2022-11-15 | Disposition: A | Discharge status: Home / Self Care | Payer: Medicare Other | Source: Ambulatory Visit | Attending: Cardiology | Admitting: Cardiology

## 2022-11-15 DIAGNOSIS — Z955 Presence of coronary angioplasty implant and graft: Secondary | ICD-10-CM

## 2022-11-15 NOTE — Progress Notes (Signed)
Daily Session Note  Patient Details  Name: Richard Nolan. MRN: [id number] Date of Birth: Aug 06, 1955 Referring Provider:   Flowsheet Row CARDIAC REHAB PHASE II ORIENTATION from 10/02/2022 in [facility] CARDIAC REHABILITATION  Referring Provider Dr. Clifton James       Encounter Date: 11/15/2022  Check In:  Session Check In - 11/15/22 0802       Check-In   Supervising physician immediately available to respond to emergencies [facility] MD immediately available    Physician(s) Dr. Jenene Slicker    Staff Present Cristopher Peru MHA, MS, ACSM-CEP;Staci Righter, RN, Neal Dy, RN, BSN    Virtual Visit No    Medication changes reported     No    Fall or balance concerns reported    No    Tobacco Cessation No Change    Warm-up and Cool-down Performed as group-led instruction    Resistance Training Performed Yes    VAD Patient? No    PAD/SET Patient? No      Pain Assessment   Currently in Pain? No/denies    Pain Score 0-No pain    Multiple Pain Sites No             Capillary Blood Glucose: No results found for this or any previous visit (from the past 24 hour(s)).    Social History   Tobacco Use  Smoking Status Never  Smokeless Tobacco Never    Goals Met:  Independence with exercise equipment Exercise tolerated well No report of concerns or symptoms today Strength training completed today  Goals Unmet:  Not Applicable  Comments: Check out 915.   Dr. Dina Rich is Medical Director for [facility] Cardiac Rehab

## 2022-11-18 ENCOUNTER — Encounter (HOSPITAL_COMMUNITY)
Admission: RE | Admit: 2022-11-18 | Discharge: 2022-11-18 | Disposition: A | Discharge status: Home / Self Care | Payer: Medicare Other | Source: Ambulatory Visit | Attending: Cardiology | Admitting: Cardiology

## 2022-11-18 VITALS — Wt 250.9 lb

## 2022-11-18 DIAGNOSIS — Z955 Presence of coronary angioplasty implant and graft: Secondary | ICD-10-CM

## 2022-11-18 NOTE — Progress Notes (Signed)
Daily Session Note  Patient Details  Name: Richard Nolan. MRN: [id number] Date of Birth: Sep 06, 1955 Referring Provider:   Flowsheet Row CARDIAC REHAB PHASE II ORIENTATION from 10/02/2022 in [facility] CARDIAC REHABILITATION  Referring Provider Dr. Clifton James       Encounter Date: 11/18/2022  Check In:  Session Check In - 11/18/22 0815       Check-In   Supervising physician immediately available to respond to emergencies [facility] MD immediately available    Physician(s) Dr. Wyline Mood    Location [facility]    Staff Present Cristopher Peru MHA, MS, ACSM-CEP;Staci Righter, RN, BSN;Heather Fredric Mare, BS, Exercise Physiologist    Virtual Visit No    Medication changes reported     No    Fall or balance concerns reported    No    Tobacco Cessation No Change    Warm-up and Cool-down Performed as group-led instruction    Resistance Training Performed Yes    VAD Patient? No    PAD/SET Patient? No      Pain Assessment   Currently in Pain? No/denies    Pain Score 0-No pain    Multiple Pain Sites No             Capillary Blood Glucose: No results found for this or any previous visit (from the past 24 hour(s)).    Social History   Tobacco Use  Smoking Status Never  Smokeless Tobacco Never    Goals Met:  Independence with exercise equipment Exercise tolerated well No report of concerns or symptoms today Strength training completed today  Goals Unmet:  Not Applicable  Comments: checkout time is 0915   Dr. Dina Rich is Medical Director for [facility] Cardiac Rehab

## 2022-11-20 ENCOUNTER — Encounter (HOSPITAL_COMMUNITY)
Admission: RE | Admit: 2022-11-20 | Discharge: 2022-11-20 | Disposition: A | Discharge status: Home / Self Care | Payer: Medicare Other | Source: Ambulatory Visit | Attending: Cardiology | Admitting: Cardiology

## 2022-11-20 DIAGNOSIS — Z955 Presence of coronary angioplasty implant and graft: Secondary | ICD-10-CM | POA: Diagnosis not present

## 2022-11-20 NOTE — Progress Notes (Signed)
Cardiac Individual Treatment Plan  Patient Details  Name: Richard Nolan. MRN: [id number] Date of Birth: 1955-07-22 Referring Provider:   Flowsheet Row CARDIAC REHAB PHASE II ORIENTATION from 10/02/2022 in [facility] CARDIAC REHABILITATION  Referring Provider Dr. Clifton James       Initial Encounter Date:  Flowsheet Row CARDIAC REHAB PHASE II ORIENTATION from 10/02/2022 in [place] CARDIAC REHABILITATION  Date 10/02/22       Visit Diagnosis: Status post coronary artery stent placement  Patient's Home Medications on Admission:  Current Outpatient Medications:    allopurinol (ZYLOPRIM) 100 MG tablet, Take 100 mg by mouth in the morning., Disp: , Rfl:    aspirin EC 81 MG tablet, Take 81 mg by mouth in the morning., Disp: , Rfl:    calcium carbonate (TUMS - DOSED IN MG ELEMENTAL CALCIUM) 500 MG chewable tablet, Chew 1 tablet by mouth daily as needed for indigestion or heartburn., Disp: , Rfl:    clopidogrel (PLAVIX) 75 MG tablet, Take 1 tablet (75 mg total) by mouth daily., Disp: 90 tablet, Rfl: 1   metoprolol succinate (TOPROL XL) 25 MG 24 hr tablet, Take 1 tablet (25 mg total) by mouth daily., Disp: 30 tablet, Rfl: 11   nitroGLYCERIN (NITROSTAT) 0.4 MG SL tablet, Place 1 tablet (0.4 mg total) under the tongue every 5 (five) minutes as needed for chest pain., Disp: 25 tablet, Rfl: 1   rosuvastatin (CRESTOR) 20 MG tablet, Take 20 mg by mouth at bedtime., Disp: , Rfl:   Past Medical History: Past Medical History:  Diagnosis Date   Aortic dilatation (HCC) 08/2022   38mm   CAD (coronary artery disease) 08/29/2022   DES 1st dx   Hyperlipidemia    Hypertension    OSA (obstructive sleep apnea)    CPAP nightly   Stenosing tenosynovitis of finger of left hand    left index and left middle fingers    Tobacco Use: Social History   Tobacco Use  Smoking Status Never  Smokeless Tobacco Never    Labs: Review Flowsheet       Latest Ref Rng & Units 08/15/2022  Labs for ITP Cardiac  and Pulmonary Rehab  Cholestrol 100 - 199 mg/dL 409   LDL (calc) 0 - 99 mg/dL 42   HDL-C >81 mg/dL 42   Trlycerides 0 - 191 mg/dL 87     Capillary Blood Glucose: No results found for: "GLUCAP"   Exercise Target Goals: Exercise Program Goal: Individual exercise prescription set using results from initial 6 min walk test and THRR while considering  patient's activity barriers and safety.   Exercise Prescription Goal: Starting with aerobic activity 30 plus minutes a day, 3 days per week for initial exercise prescription. Provide home exercise prescription and guidelines that participant acknowledges understanding prior to discharge.  Activity Barriers & Risk Stratification:  Activity Barriers & Cardiac Risk Stratification - 10/02/22 1257       Activity Barriers & Cardiac Risk Stratification   Activity Barriers Arthritis;Back Problems;Neck/Spine Problems;Joint Problems;Deconditioning;Shortness of Breath    Cardiac Risk Stratification High             6 Minute Walk:  6 Minute Walk     Row Name 10/02/22 1404         6 Minute Walk   Phase Initial     Distance 1500 feet     Walk Time 6 minutes     # of Rest Breaks 0     MPH 2.84  METS 2.62     RPE 11     VO2 Peak 9.18     Symptoms No     Resting HR 65 bpm     Resting BP 126/80     Resting Oxygen Saturation  96 %     Exercise Oxygen Saturation  during 6 min walk 94 %     Max Ex. HR 82 bpm     Max Ex. BP 140/80     2 Minute Post BP 120/70              Oxygen Initial Assessment:   Oxygen Re-Evaluation:   Oxygen Discharge (Final Oxygen Re-Evaluation):   Initial Exercise Prescription:  Initial Exercise Prescription - 10/02/22 1400       Date of Initial Exercise RX and Referring Provider   Date 10/02/22    Referring Provider Dr. Clifton James    Expected Discharge Date 12/25/22      Treadmill   MPH 2    Grade 0    Minutes 17      Recumbant Elliptical   Level 1    RPM 45    Minutes 22       Prescription Details   Frequency (times per week) 3    Duration Progress to 30 minutes of continuous aerobic without signs/symptoms of physical distress      Intensity   THRR 40-80% of Max Heartrate 61-122    Ratings of Perceived Exertion 11-13      Resistance Training   Training Prescription Yes    Weight 3    Reps 10-15             Perform Capillary Blood Glucose checks as needed.  Exercise Prescription Changes:   Exercise Prescription Changes     Row Name 10/07/22 1000 10/21/22 1300 11/04/22 1200 11/18/22 1100       Response to Exercise   Blood Pressure (Admit) 134/62 116/56 124/60 110/70    Blood Pressure (Exercise) 132/70 136/60 138/62 140/60    Blood Pressure (Exit) 142/60 126/60 118/60 132/74    Heart Rate (Admit) 71 bpm 67 bpm 71 bpm 65 bpm    Heart Rate (Exercise) 110 bpm 104 bpm 88 bpm 101 bpm    Heart Rate (Exit) 82 bpm 77 bpm 80 bpm 73 bpm    Rating of Perceived Exertion (Exercise) 12 11 11 11     Duration Continue with 30 min of aerobic exercise without signs/symptoms of physical distress. Continue with 30 min of aerobic exercise without signs/symptoms of physical distress. Continue with 30 min of aerobic exercise without signs/symptoms of physical distress. Continue with 30 min of aerobic exercise without signs/symptoms of physical distress.    Intensity THRR unchanged THRR unchanged THRR unchanged THRR unchanged      Progression   Progression Continue to progress workloads to maintain intensity without signs/symptoms of physical distress. Continue to progress workloads to maintain intensity without signs/symptoms of physical distress. Continue to progress workloads to maintain intensity without signs/symptoms of physical distress. Continue to progress workloads to maintain intensity without signs/symptoms of physical distress.      Resistance Training   Training Prescription Yes Yes Yes Yes    Weight 4 4 4 8     Reps 10-15 10-15 10-15 10-15    Time 10  Minutes 10 Minutes 10 Minutes 10 Minutes      Treadmill   MPH 2.4 2.4 2.2 2.2    Grade 0 1 4 5     Minutes 22 17 17  17    METs 2.84 3.17 3.9 4.2      Recumbant Elliptical   Level 2 5 5 5     RPM 65 60 57 62    Minutes 17 22 22 22     METs 2.8 5.3 3.3 5.9             Exercise Comments:   Exercise Goals and Review:   Exercise Goals     Row Name 10/02/22 1408 10/21/22 1323 11/18/22 1152         Exercise Goals   Increase Physical Activity Yes Yes Yes     Intervention Provide advice, education, support and counseling about physical activity/exercise needs.;Develop an individualized exercise prescription for aerobic and resistive training based on initial evaluation findings, risk stratification, comorbidities and participant's personal goals. Provide advice, education, support and counseling about physical activity/exercise needs.;Develop an individualized exercise prescription for aerobic and resistive training based on initial evaluation findings, risk stratification, comorbidities and participant's personal goals. Provide advice, education, support and counseling about physical activity/exercise needs.;Develop an individualized exercise prescription for aerobic and resistive training based on initial evaluation findings, risk stratification, comorbidities and participant's personal goals.     Expected Outcomes Short Term: Attend rehab on a regular basis to increase amount of physical activity.;Long Term: Add in home exercise to make exercise part of routine and to increase amount of physical activity.;Long Term: Exercising regularly at least 3-5 days a week. Short Term: Attend rehab on a regular basis to increase amount of physical activity.;Long Term: Add in home exercise to make exercise part of routine and to increase amount of physical activity.;Long Term: Exercising regularly at least 3-5 days a week. Short Term: Attend rehab on a regular basis to increase amount of physical  activity.;Long Term: Add in home exercise to make exercise part of routine and to increase amount of physical activity.;Long Term: Exercising regularly at least 3-5 days a week.     Increase Strength and Stamina Yes Yes Yes     Intervention Provide advice, education, support and counseling about physical activity/exercise needs.;Develop an individualized exercise prescription for aerobic and resistive training based on initial evaluation findings, risk stratification, comorbidities and participant's personal goals. Provide advice, education, support and counseling about physical activity/exercise needs.;Develop an individualized exercise prescription for aerobic and resistive training based on initial evaluation findings, risk stratification, comorbidities and participant's personal goals. Provide advice, education, support and counseling about physical activity/exercise needs.;Develop an individualized exercise prescription for aerobic and resistive training based on initial evaluation findings, risk stratification, comorbidities and participant's personal goals.     Expected Outcomes Short Term: Increase workloads from initial exercise prescription for resistance, speed, and METs.;Short Term: Perform resistance training exercises routinely during rehab and add in resistance training at home;Long Term: Improve cardiorespiratory fitness, muscular endurance and strength as measured by increased METs and functional capacity ( ) Short Term: Increase workloads from initial exercise prescription for resistance, speed, and METs.;Short Term: Perform resistance training exercises routinely during rehab and add in resistance training at home;Long Term: Improve cardiorespiratory fitness, muscular endurance and strength as measured by increased METs and functional capacity ( ) Short Term: Increase workloads from initial exercise prescription for resistance, speed, and METs.;Short Term: Perform resistance training  exercises routinely during rehab and add in resistance training at home;Long Term: Improve cardiorespiratory fitness, muscular endurance and strength as measured by increased METs and functional capacity ( )     Able to understand and use rate of perceived exertion (RPE) scale Yes Yes Yes  Intervention Provide education and explanation on how to use RPE scale Provide education and explanation on how to use RPE scale Provide education and explanation on how to use RPE scale     Expected Outcomes Short Term: Able to use RPE daily in rehab to express subjective intensity level;Long Term:  Able to use RPE to guide intensity level when exercising independently Short Term: Able to use RPE daily in rehab to express subjective intensity level;Long Term:  Able to use RPE to guide intensity level when exercising independently Short Term: Able to use RPE daily in rehab to express subjective intensity level;Long Term:  Able to use RPE to guide intensity level when exercising independently     Knowledge and understanding of Target Heart Rate Range (THRR) Yes Yes Yes     Intervention Provide education and explanation of THRR including how the numbers were predicted and where they are located for reference Provide education and explanation of THRR including how the numbers were predicted and where they are located for reference Provide education and explanation of THRR including how the numbers were predicted and where they are located for reference     Expected Outcomes Short Term: Able to state/look up THRR;Long Term: Able to use THRR to govern intensity when exercising independently;Short Term: Able to use daily as guideline for intensity in rehab Short Term: Able to state/look up THRR;Long Term: Able to use THRR to govern intensity when exercising independently;Short Term: Able to use daily as guideline for intensity in rehab Short Term: Able to state/look up THRR;Long Term: Able to use THRR to govern intensity  when exercising independently;Short Term: Able to use daily as guideline for intensity in rehab     Able to check pulse independently Yes Yes Yes     Intervention Provide education and demonstration on how to check pulse in carotid and radial arteries.;Review the importance of being able to check your own pulse for safety during independent exercise Provide education and demonstration on how to check pulse in carotid and radial arteries.;Review the importance of being able to check your own pulse for safety during independent exercise Provide education and demonstration on how to check pulse in carotid and radial arteries.;Review the importance of being able to check your own pulse for safety during independent exercise     Expected Outcomes Long Term: Able to check pulse independently and accurately;Short Term: Able to explain why pulse checking is important during independent exercise Long Term: Able to check pulse independently and accurately;Short Term: Able to explain why pulse checking is important during independent exercise Long Term: Able to check pulse independently and accurately;Short Term: Able to explain why pulse checking is important during independent exercise     Understanding of Exercise Prescription Yes Yes Yes     Intervention Provide education, explanation, and written materials on patient's individual exercise prescription Provide education, explanation, and written materials on patient's individual exercise prescription Provide education, explanation, and written materials on patient's individual exercise prescription     Expected Outcomes Short Term: Able to explain program exercise prescription;Long Term: Able to explain home exercise prescription to exercise independently Short Term: Able to explain program exercise prescription;Long Term: Able to explain home exercise prescription to exercise independently Short Term: Able to explain program exercise prescription;Long Term: Able to  explain home exercise prescription to exercise independently              Exercise Goals Re-Evaluation :  Exercise Goals Re-Evaluation     Row Name  10/21/22 1324 11/18/22 1153           Exercise Goal Re-Evaluation   Exercise Goals Review Increase Physical Activity;Increase Strength and Stamina;Able to understand and use rate of perceived exertion (RPE) scale;Knowledge and understanding of Target Heart Rate Range (THRR);Able to check pulse independently;Understanding of Exercise Prescription Increase Physical Activity;Increase Strength and Stamina;Able to understand and use rate of perceived exertion (RPE) scale;Knowledge and understanding of Target Heart Rate Range (THRR);Able to check pulse independently;Understanding of Exercise Prescription      Comments Pt has completed 9 sessions of cardiac rehab. He is tolerating exercise well and has started to increase his workload. He is currently exercising at 5.3 METs on the ellp. Will continue to monitor and progress as able. Pt has completed 21 sessions of cardiac rehab. He continues to do well in the program and increasing his workloads. He sometimes uses just his arms on the ellp and has been asked if he would like to use the arm ergometer instead of the ellp but he does not. He is currently exercising at 5.9 METs on the ellp. Will continue to monitor and progress as able,      Expected Outcomes Throught exercise at rehab and home, the patients will meet their stated goals. Throught exercise at rehab and home, the patients will meet their stated goals.                Discharge Exercise Prescription (Final Exercise Prescription Changes):  Exercise Prescription Changes - 11/18/22 1100       Response to Exercise   Blood Pressure (Admit) 110/70    Blood Pressure (Exercise) 140/60    Blood Pressure (Exit) 132/74    Heart Rate (Admit) 65 bpm    Heart Rate (Exercise) 101 bpm    Heart Rate (Exit) 73 bpm    Rating of Perceived Exertion  (Exercise) 11    Duration Continue with 30 min of aerobic exercise without signs/symptoms of physical distress.    Intensity THRR unchanged      Progression   Progression Continue to progress workloads to maintain intensity without signs/symptoms of physical distress.      Resistance Training   Training Prescription Yes    Weight 8    Reps 10-15    Time 10 Minutes      Treadmill   MPH 2.2    Grade 5    Minutes 17    METs 4.2      Recumbant Elliptical   Level 5    RPM 62    Minutes 22    METs 5.9             Nutrition:  Target Goals: Understanding of nutrition guidelines, daily intake of sodium 1500mg , cholesterol 200mg , calories 30% from fat and 7% or less from saturated fats, daily to have 5 or more servings of fruits and vegetables.  Biometrics:  Pre Biometrics - 10/02/22 1409       Pre Biometrics   Height 5\' 9"  (1.753 m)    Weight 116.8 kg    Waist Circumference 52 inches    Hip Circumference 49 inches    Waist to Hip Ratio 1.06 %    BMI (Calculated) 38.01    Triceps Skinfold 15 mm    % Body Fat 37 %    Grip Strength 25.7 kg    Flexibility 0 in    Single Leg Stand 20.21 seconds              Nutrition  Therapy Plan and Nutrition Goals:  Nutrition Therapy & Goals - 10/02/22 1309       Personal Nutrition Goals   Comments Pt is eating a low sodium diet and trying to stay away from high sugar foods.      Intervention Plan   Intervention Nutrition handout(s) given to patient.;Prescribe, educate and counsel regarding individualized specific dietary modifications aiming towards targeted core components such as weight, hypertension, lipid management, diabetes, heart failure and other comorbidities.    Expected Outcomes Short Term Goal: Understand basic principles of dietary content, such as calories, fat, sodium, cholesterol and nutrients.;Long Term Goal: Adherence to prescribed nutrition plan.             Nutrition Assessments:  Nutrition  Assessments - 10/02/22 1312       MEDFICTS Scores   Pre Score 27            MEDIFICTS Score Key: ?70 Need to make dietary changes  40-70 Heart Healthy Diet ? 40 Therapeutic Level Cholesterol Diet   Picture Your Plate Scores: <16 Unhealthy dietary pattern with much room for improvement. 41-50 Dietary pattern unlikely to meet recommendations for good health and room for improvement. 51-60 More healthful dietary pattern, with some room for improvement.  >60 Healthy dietary pattern, although there may be some specific behaviors that could be improved.    Nutrition Goals Re-Evaluation:   Nutrition Goals Discharge (Final Nutrition Goals Re-Evaluation):   Psychosocial: Target Goals: Acknowledge presence or absence of significant depression and/or stress, maximize coping skills, provide positive support system. Participant is able to verbalize types and ability to use techniques and skills needed for reducing stress and depression.  Initial Review & Psychosocial Screening:  Initial Psych Review & Screening - 10/02/22 1305       Initial Review   Current issues with None Identified      Family Dynamics   Good Support System? Yes    Comments Pt has a good support system with his wife and his 3 children.      Barriers   Psychosocial barriers to participate in program There are no identifiable barriers or psychosocial needs.      Screening Interventions   Interventions Encouraged to exercise;To provide support and resources with identified psychosocial needs;Provide feedback about the scores to participant    Expected Outcomes Short Term goal: Identification and review with participant of any Quality of Life or Depression concerns found by scoring the questionnaire.;Long Term goal: The participant improves quality of Life and PHQ9 Scores as seen by post scores and/or verbalization of changes             Quality of Life Scores:  Quality of Life - 10/02/22 1412        Quality of Life   Select Quality of Life      Quality of Life Scores   Health/Function Pre 28.4 %    Socioeconomic Pre 30 %    Psych/Spiritual Pre 28.29 %    Family Pre 30 %    GLOBAL Pre 28.94 %            Scores of 19 and below usually indicate a poorer quality of life in these areas.  A difference of  2-3 points is a clinically meaningful difference.  A difference of 2-3 points in the total score of the Quality of Life Index has been associated with significant improvement in overall quality of life, self-image, physical symptoms, and general health in studies assessing change in quality of  life.  PHQ-9: Review Flowsheet  More data exists      10/02/2022 05/09/2018 10/06/2017 07/23/2017 07/07/2015  Depression screen PHQ 2/9  Decreased Interest 0 0 0 0 0  Down, Depressed, Hopeless 0 0 0 0 0  PHQ - 2 Score 0 0 0 0 0  Altered sleeping 0 - - - -  Tired, decreased energy 0 - - - -  Change in appetite 0 - - - -  Feeling bad or failure about yourself  0 - - - -  Trouble concentrating 0 - - - -  Moving slowly or fidgety/restless 0 - - - -  Suicidal thoughts 0 - - - -  PHQ-9 Score 0 - - - -  Difficult doing work/chores Not difficult at all - - - -   Interpretation of Total Score  Total Score Depression Severity:  1-4 = Minimal depression, 5-9 = Mild depression, 10-14 = Moderate depression, 15-19 = Moderately severe depression, 20-27 = Severe depression   Psychosocial Evaluation and Intervention:  Psychosocial Evaluation - 10/02/22 1334       Psychosocial Evaluation & Interventions   Comments Pt has no identifiable psychosocial barriers.  He is a retired [employer] and has a good support system with his wife and 3 children.  His PHQ-9 was a zero.  His goals for the program are to increase his strength/stamina and be able to breathe better.  He also wants to be able to play with his grandchildren.  We will continue to monitor his progress as he works toward these goals.    Expected  Outcomes Pt will continue to have no identifiable psychosocial barriers.    Continue Psychosocial Services  No Follow up required             Psychosocial Re-Evaluation:  Psychosocial Re-Evaluation     Row Name 10/14/22 1512 11/13/22 0805           Psychosocial Re-Evaluation   Current issues with None Identified None Identified      Comments Patient is new to the program. He has completed 5 sessions. He continues to have no psychosocial barriers identified. He seems to enjoy the sessions and demonstrates an interest in improving his health. We will continue to monitor his progress. Patient has completed 17 sessions. He continues to have no psychosocial barriers identified. He continues to enjoy the sessions and demonstrates an interest in improving his health. We will continue to monitor his progress.      Expected Outcomes Patient will complete the program meeting both personal and program goals. Patient will complete the program meeting both personal and program goals.      Interventions Stress management education;Encouraged to attend Cardiac Rehabilitation for the exercise;Relaxation education Stress management education;Encouraged to attend Cardiac Rehabilitation for the exercise;Relaxation education      Continue Psychosocial Services  No Follow up required No Follow up required               Psychosocial Discharge (Final Psychosocial Re-Evaluation):  Psychosocial Re-Evaluation - 11/13/22 0805       Psychosocial Re-Evaluation   Current issues with None Identified    Comments Patient has completed 17 sessions. He continues to have no psychosocial barriers identified. He continues to enjoy the sessions and demonstrates an interest in improving his health. We will continue to monitor his progress.    Expected Outcomes Patient will complete the program meeting both personal and program goals.    Interventions Stress management education;Encouraged to attend Cardiac  Rehabilitation for the exercise;Relaxation education    Continue Psychosocial Services  No Follow up required             Vocational Rehabilitation: Provide vocational rehab assistance to qualifying candidates.   Vocational Rehab Evaluation & Intervention:  Vocational Rehab - 10/02/22 1320       Initial Vocational Rehab Evaluation & Intervention   Assessment shows need for Vocational Rehabilitation No      Vocational Rehab Re-Evaulation   Comments pt is retired.             Education: Education Goals: Education classes will be provided on a weekly basis, covering required topics. Participant will state understanding/return demonstration of topics presented.  Learning Barriers/Preferences:  Learning Barriers/Preferences - 10/02/22 1315       Learning Barriers/Preferences   Learning Barriers None    Learning Preferences Written Material;Verbal Instruction             Education Topics: Hypertension, Hypertension Reduction -Define heart disease and high blood pressure. Discus how high blood pressure affects the body and ways to reduce high blood pressure. Flowsheet Row CARDIAC REHAB PHASE II EXERCISE from 11/13/2022 in [place] CARDIAC REHABILITATION  Date 10/09/22  Educator HB  Instruction Review Code 1- Verbalizes Understanding       Exercise and Your Heart -Discuss why it is important to exercise, the FITT principles of exercise, normal and abnormal responses to exercise, and how to exercise safely. Flowsheet Row CARDIAC REHAB PHASE II EXERCISE from 11/13/2022 in [place] CARDIAC REHABILITATION  Date 10/16/22  Educator HB  Instruction Review Code 1- Verbalizes Understanding       Angina -Discuss definition of angina, causes of angina, treatment of angina, and how to decrease risk of having angina. Flowsheet Row CARDIAC REHAB PHASE II EXERCISE from 11/13/2022 in [place] CARDIAC REHABILITATION  Date 10/23/22  Educator HB  Instruction Review Code  2- Demonstrated Understanding       Cardiac Medications -Review what the following cardiac medications are used for, how they affect the body, and side effects that may occur when taking the medications.  Medications include Aspirin, Beta blockers, calcium channel blockers, ACE Inhibitors, angiotensin receptor blockers, diuretics, digoxin, and antihyperlipidemics.   Congestive Heart Failure -Discuss the definition of CHF, how to live with CHF, the signs and symptoms of CHF, and how keep track of weight and sodium intake. Flowsheet Row CARDIAC REHAB PHASE II EXERCISE from 11/13/2022 in [place] CARDIAC REHABILITATION  Date 10/30/22  Educator HB  Instruction Review Code 1- Verbalizes Understanding       Heart Disease and Intimacy -Discus the effect sexual activity has on the heart, how changes occur during intimacy as we age, and safety during sexual activity. Flowsheet Row CARDIAC REHAB PHASE II EXERCISE from 11/13/2022 in [place] CARDIAC REHABILITATION  Date 11/06/22  Educator HB  Instruction Review Code 1- Verbalizes Understanding       Smoking Cessation / COPD -Discuss different methods to quit smoking, the health benefits of quitting smoking, and the definition of COPD. Flowsheet Row CARDIAC REHAB PHASE II EXERCISE from 11/13/2022 in [place] CARDIAC REHABILITATION  Date 11/13/22  Educator hb  Instruction Review Code 2- Demonstrated Understanding       Nutrition I: Fats -Discuss the types of cholesterol, what cholesterol does to the heart, and how cholesterol levels can be controlled.   Nutrition II: Labels -Discuss the different components of food labels and how to read food label   Heart Parts/Heart Disease and  PAD -Discuss the anatomy of the heart, the pathway of blood circulation through the heart, and these are affected by heart disease.   Stress I: Signs and Symptoms -Discuss the causes of stress, how stress may lead to anxiety and depression, and ways  to limit stress.   Stress II: Relaxation -Discuss different types of relaxation techniques to limit stress.   Warning Signs of Stroke / TIA -Discuss definition of a stroke, what the signs and symptoms are of a stroke, and how to identify when someone is having stroke.   Knowledge Questionnaire Score:  Knowledge Questionnaire Score - 10/02/22 1317       Knowledge Questionnaire Score   Pre Score 22/24             Core Components/Risk Factors/Patient Goals at Admission:  Personal Goals and Risk Factors at Admission - 10/02/22 1320       Core Components/Risk Factors/Patient Goals on Admission    Weight Management Yes;Weight Loss    Intervention Obesity: Provide education and appropriate resources to help participant work on and attain dietary goals.;Weight Management/Obesity: Establish reasonable short term and long term weight goals.;Weight Management: Provide education and appropriate resources to help participant work on and attain dietary goals.;Weight Management: Develop a combined nutrition and exercise program designed to reach desired caloric intake, while maintaining appropriate intake of nutrient and fiber, sodium and fats, and appropriate energy expenditure required for the weight goal.    Expected Outcomes Short Term: Continue to assess and modify interventions until short term weight is achieved;Long Term: Adherence to nutrition and physical activity/exercise program aimed toward attainment of established weight goal;Weight Maintenance: Understanding of the daily nutrition guidelines, which includes 25-35% calories from fat, 7% or less cal from saturated fats, less than 200mg  cholesterol, less than 1.5gm of sodium, & 5 or more servings of fruits and vegetables daily;Weight Loss: Understanding of general recommendations for a balanced deficit meal plan, which promotes 1-2 lb weight loss per week and includes a negative energy balance of [phone] kcal/d;Understanding  recommendations for meals to include 15-35% energy as protein, 25-35% energy from fat, 35-60% energy from carbohydrates, less than 200mg  of dietary cholesterol, 20-35 gm of total fiber daily;Understanding of distribution of calorie intake throughout the day with the consumption of 4-5 meals/snacks    Improve shortness of breath with ADL's Yes    Intervention Provide education, individualized exercise plan and daily activity instruction to help decrease symptoms of SOB with activities of daily living.    Expected Outcomes Short Term: Improve cardiorespiratory fitness to achieve a reduction of symptoms when performing ADLs;Long Term: Be able to perform more ADLs without symptoms or delay the onset of symptoms    Hypertension Yes    Intervention Provide education on lifestyle modifcations including regular physical activity/exercise, weight management, moderate sodium restriction and increased consumption of fresh fruit, vegetables, and low fat dairy, alcohol moderation, and smoking cessation.;Monitor prescription use compliance.    Expected Outcomes Short Term: Continued assessment and intervention until BP is < 140/64mm HG in hypertensive participants. < 130/78mm HG in hypertensive participants with diabetes, heart failure or chronic kidney disease.;Long Term: Maintenance of blood pressure at goal levels.    Lipids Yes    Intervention Provide education and support for participant on nutrition & aerobic/resistive exercise along with prescribed medications to achieve LDL 70mg , HDL >40mg .    Expected Outcomes Short Term: Participant states understanding of desired cholesterol values and is compliant with medications prescribed. Participant is following exercise prescription and nutrition guidelines.;Long  Term: Cholesterol controlled with medications as prescribed, with individualized exercise RX and with personalized nutrition plan. Value goals: LDL < 70mg , HDL > 40 mg.    Personal Goal Other Yes    Personal  Goal Pt wants to be able to breathe better and increase his strength/stamina.    Intervention Pt will attend CR 3 times a week and continue his home exercise program.    Expected Outcomes Pt will meet both the program and his personal goals.             Core Components/Risk Factors/Patient Goals Review:   Goals and Risk Factor Review     Row Name 10/14/22 1514 11/13/22 0806           Core Components/Risk Factors/Patient Goals Review   Personal Goals Review Weight Management/Obesity;Hypertension;Lipids;Improve shortness of breath with ADL's;Other Weight Management/Obesity;Hypertension;Lipids;Improve shortness of breath with ADL's;Other      Review Patient was referred to CR with stent placement. He has multiple risk factors for CAD and is participating in the program for risk modification. He has completed 5 sessions. His current weight is 259 lbs maintained since his orientation visit. His blood pressure is at goal. His is doing well in the program with consistent attendance and progressions. His personal goals for the program are to increase his strength and stamina; breathe better; and be able to play with his grandchildren. We will continue to monitor his progress as he works towards meeting these goals. Patient has completed 17 sessions. His current weight is 256.4 lbs losing 2.6 lbs since last 30 day review. He is doing well in the program with consistent attendance and progressions. His blood pressure continues to be at goal.  His personal goals for the program are to increase his strength and stamina; breathe better; and be able to play with his grandchildren. We will continue to monitor his progress as he works towards meeting these goals.      Expected Outcomes Patient will complete the program meeting both personal and program goals. Patient will complete the program meeting both personal and program goals.               Core Components/Risk Factors/Patient Goals at Discharge  (Final Review):   Goals and Risk Factor Review - 11/13/22 0806       Core Components/Risk Factors/Patient Goals Review   Personal Goals Review Weight Management/Obesity;Hypertension;Lipids;Improve shortness of breath with ADL's;Other    Review Patient has completed 17 sessions. His current weight is 256.4 lbs losing 2.6 lbs since last 30 day review. He is doing well in the program with consistent attendance and progressions. His blood pressure continues to be at goal.  His personal goals for the program are to increase his strength and stamina; breathe better; and be able to play with his grandchildren. We will continue to monitor his progress as he works towards meeting these goals.    Expected Outcomes Patient will complete the program meeting both personal and program goals.             ITP Comments:   Comments: ITP REVIEW Pt is making expected progress toward Cardiac Rehab goals after completing 21 sessions. Recommend continued exercise, life style modification, education, and increased stamina and strength.

## 2022-11-20 NOTE — Progress Notes (Signed)
Daily Session Note  Patient Details  Name: Richard Nolan. MRN: [id number] Date of Birth: [birth year] Referring Provider:   Flowsheet Row CARDIAC REHAB PHASE II ORIENTATION from 10/02/2022 in [facility] CARDIAC REHABILITATION  Referring Provider Dr. Clifton James       Encounter Date: 11/20/2022  Check In:  Session Check In - 11/20/22 0815       Check-In   Supervising physician immediately available to respond to emergencies [facility] MD immediately available    Physician(s) Dr. Dietrich Pates    Location [facility]    Staff Present Cristopher Peru MHA, MS, ACSM-CEP;Ross Ludwig, BS, Exercise Physiologist;Julen Rubert Laural Benes, RN, BSN    Virtual Visit No    Medication changes reported     No    Fall or balance concerns reported    No    Tobacco Cessation No Change    Warm-up and Cool-down Performed as group-led instruction    Resistance Training Performed Yes    VAD Patient? No    PAD/SET Patient? No      Pain Assessment   Currently in Pain? No/denies    Pain Score 0-No pain    Multiple Pain Sites No             Capillary Blood Glucose: No results found for this or any previous visit (from the past 24 hour(s)).    Social History   Tobacco Use  Smoking Status Never  Smokeless Tobacco Never    Goals Met:  Independence with exercise equipment Exercise tolerated well No report of concerns or symptoms today Strength training completed today  Goals Unmet:  Not Applicable  Comments: Check out 915.   Dr. Dina Rich is Medical Director for [facility] Cardiac Rehab

## 2022-11-22 ENCOUNTER — Encounter (HOSPITAL_COMMUNITY)
Admission: RE | Admit: 2022-11-22 | Discharge: 2022-11-22 | Disposition: A | Discharge status: Home / Self Care | Payer: Medicare Other | Source: Ambulatory Visit | Attending: Cardiology | Admitting: Cardiology

## 2022-11-22 DIAGNOSIS — Z955 Presence of coronary angioplasty implant and graft: Secondary | ICD-10-CM | POA: Diagnosis not present

## 2022-11-22 NOTE — Progress Notes (Signed)
Daily Session Note  Patient Details  Name: Richard Nolan. MRN: [id number] Date of Birth: [birth year] Referring Provider:   Flowsheet Row CARDIAC REHAB PHASE II ORIENTATION from 10/02/2022 in [facility] CARDIAC REHABILITATION  Referring Provider Dr. Clifton James       Encounter Date: 11/22/2022  Check In:  Session Check In - 11/22/22 0815       Check-In   Supervising physician immediately available to respond to emergencies [facility] MD immediately available    Physician(s) Dr. Rennis Golden    Location [facility]    Staff Present Ross Ludwig, BS, Exercise Physiologist;Dalton Debbe Mounts, MS, ACSM-CEP;Josedejesus Marcum Laural Benes, RN, Pleas Koch, RN, BSN    Virtual Visit No    Medication changes reported     No    Fall or balance concerns reported    No    Tobacco Cessation No Change    Warm-up and Cool-down Performed as group-led instruction    Resistance Training Performed Yes    VAD Patient? No    PAD/SET Patient? No      Pain Assessment   Currently in Pain? No/denies    Pain Score 0-No pain    Multiple Pain Sites No             Capillary Blood Glucose: No results found for this or any previous visit (from the past 24 hour(s)).    Social History   Tobacco Use  Smoking Status Never  Smokeless Tobacco Never    Goals Met:  Independence with exercise equipment Exercise tolerated well No report of concerns or symptoms today Strength training completed today  Goals Unmet:  Not Applicable  Comments: Check out 915.   Dr. Dina Rich is Medical Director for [facility] Cardiac Rehab

## 2022-11-25 ENCOUNTER — Encounter (HOSPITAL_COMMUNITY): Payer: Medicare Other

## 2022-11-27 ENCOUNTER — Encounter (HOSPITAL_COMMUNITY)
Admission: RE | Admit: 2022-11-27 | Discharge: 2022-11-27 | Disposition: A | Discharge status: Home / Self Care | Payer: Medicare Other | Source: Ambulatory Visit | Attending: Cardiology | Admitting: Cardiology

## 2022-11-27 DIAGNOSIS — Z955 Presence of coronary angioplasty implant and graft: Secondary | ICD-10-CM

## 2022-11-27 NOTE — Progress Notes (Signed)
Daily Session Note  Patient Details  Name: Richard Nolan. MRN: [id number] Date of Birth: [birth year] Referring Provider:   Flowsheet Row CARDIAC REHAB PHASE II ORIENTATION from 10/02/2022 in [facility] CARDIAC REHABILITATION  Referring Provider Dr. Clifton James       Encounter Date: 11/27/2022  Check In:  Session Check In - 11/27/22 0815       Check-In   Supervising physician immediately available to respond to emergencies [facility] MD immediately available    Physician(s) Dr. Dina Rich    Location [facility]    Staff Present Ross Ludwig, BS, Exercise Physiologist;Lucas Exline Laural Benes, RN, Pleas Koch, RN, BSN;Hillary Troutman BSN, RN    Virtual Visit No    Medication changes reported     No    Fall or balance concerns reported    No    Tobacco Cessation No Change    Warm-up and Cool-down Performed as group-led Writer Performed Yes    VAD Patient? No    PAD/SET Patient? No      Pain Assessment   Currently in Pain? No/denies    Pain Score 0-No pain    Multiple Pain Sites No             Capillary Blood Glucose: No results found for this or any previous visit (from the past 24 hour(s)).    Social History   Tobacco Use  Smoking Status Never  Smokeless Tobacco Never    Goals Met:  Independence with exercise equipment Exercise tolerated well No report of concerns or symptoms today Strength training completed today  Goals Unmet:  Not Applicable  Comments: Check out 915.   Dr. Dina Rich is Medical Director for [facility] Cardiac Rehab

## 2022-11-29 ENCOUNTER — Encounter (HOSPITAL_COMMUNITY)
Admission: RE | Admit: 2022-11-29 | Discharge: 2022-11-29 | Disposition: A | Discharge status: Home / Self Care | Payer: Medicare Other | Source: Ambulatory Visit | Attending: Cardiology | Admitting: Cardiology

## 2022-11-29 DIAGNOSIS — Z955 Presence of coronary angioplasty implant and graft: Secondary | ICD-10-CM | POA: Diagnosis not present

## 2022-11-29 NOTE — Progress Notes (Signed)
Daily Session Note  Patient Details  Name: Richard Nolan. MRN: [id number] Date of Birth: Nov 27, 1955 Referring Provider:   Flowsheet Row CARDIAC REHAB PHASE II ORIENTATION from 10/02/2022 in [facility] CARDIAC REHABILITATION  Referring Provider Dr. Clifton James       Encounter Date: 11/29/2022  Check In:  Session Check In - 11/29/22 0815       Check-In   Supervising physician immediately available to respond to emergencies [facility] MD immediately available    Physician(s) Dr. Dina Rich    Location [facility]    Staff Present Cristopher Peru MHA, MS, ACSM-CEP;Erskine Speed, RN;Enrigue Hashimi Laural Benes, RN, Pleas Koch, RN, BSN    Virtual Visit No    Medication changes reported     No    Fall or balance concerns reported    No    Tobacco Cessation No Change    Warm-up and Cool-down Performed as group-led instruction    Resistance Training Performed Yes    VAD Patient? No    PAD/SET Patient? No      Pain Assessment   Currently in Pain? No/denies    Pain Score 0-No pain    Multiple Pain Sites No             Capillary Blood Glucose: No results found for this or any previous visit (from the past 24 hour(s)).    Social History   Tobacco Use  Smoking Status Never  Smokeless Tobacco Never    Goals Met:  Independence with exercise equipment Exercise tolerated well No report of concerns or symptoms today Strength training completed today  Goals Unmet:  Not Applicable  Comments: Check out 915.   Dr. Dina Rich is Medical Director for [facility] Cardiac Rehab

## 2022-12-02 ENCOUNTER — Encounter (HOSPITAL_COMMUNITY)
Admission: RE | Admit: 2022-12-02 | Discharge: 2022-12-02 | Disposition: A | Discharge status: Home / Self Care | Payer: Medicare Other | Source: Ambulatory Visit | Attending: Cardiology | Admitting: Cardiology

## 2022-12-02 VITALS — Wt 246.9 lb

## 2022-12-02 DIAGNOSIS — Z955 Presence of coronary angioplasty implant and graft: Secondary | ICD-10-CM | POA: Diagnosis present

## 2022-12-02 NOTE — Progress Notes (Signed)
Daily Session Note  Patient Details  Name: Richard Nolan. MRN: [id number] Date of Birth: [birth year] Referring Provider:   Flowsheet Row CARDIAC REHAB PHASE II ORIENTATION from 10/02/2022 in [facility] CARDIAC REHABILITATION  Referring Provider Dr. Clifton James       Encounter Date: 12/02/2022  Check In:  Session Check In - 12/02/22 0815       Check-In   Supervising physician immediately available to respond to emergencies [facility] MD immediately available    Physician(s) Dr. Dietrich Pates    Location [facility]    Staff Present Ross Ludwig, BS, Exercise Physiologist;Phyllis Billingsley, RN;Evangelyn Crouse Laural Benes, RN, Pleas Koch, RN, BSN    Virtual Visit No    Medication changes reported     No    Fall or balance concerns reported    No    Tobacco Cessation No Change    Warm-up and Cool-down Performed as group-led instruction    Resistance Training Performed Yes    VAD Patient? No    PAD/SET Patient? No      Pain Assessment   Currently in Pain? No/denies    Pain Score 0-No pain    Multiple Pain Sites No             Capillary Blood Glucose: No results found for this or any previous visit (from the past 24 hour(s)).    Social History   Tobacco Use  Smoking Status Never  Smokeless Tobacco Never    Goals Met:  Independence with exercise equipment Exercise tolerated well No report of concerns or symptoms today Strength training completed today  Goals Unmet:  Not Applicable  Comments: Check out 915.   Dr. Dina Rich is Medical Director for [facility] Cardiac Rehab

## 2022-12-04 ENCOUNTER — Encounter (HOSPITAL_COMMUNITY)
Admission: RE | Admit: 2022-12-04 | Discharge: 2022-12-04 | Disposition: A | Discharge status: Home / Self Care | Payer: Medicare Other | Source: Ambulatory Visit | Attending: Cardiology | Admitting: Cardiology

## 2022-12-04 DIAGNOSIS — Z955 Presence of coronary angioplasty implant and graft: Secondary | ICD-10-CM | POA: Diagnosis not present

## 2022-12-04 NOTE — Progress Notes (Addendum)
Daily Session Note  Patient Details  Name: Richard Nolan. MRN: [id number] Date of Birth: [birth year] Referring Provider:   Flowsheet Row CARDIAC REHAB PHASE II ORIENTATION from 10/02/2022 in [facility] CARDIAC REHABILITATION  Referring Provider Dr. Clifton James       Encounter Date: 12/04/2022  Check In:  Session Check In - 12/04/22 0815       Check-In   Supervising physician immediately available to respond to emergencies [facility] MD immediately available    Physician(s) Dr. Jenene Slicker    Location [facility]    Staff Present Ross Ludwig, BS, Exercise Physiologist;Hillary Troutman BSN, RN;Jayvion Stefanski Laural Benes, RN, BSN    Virtual Visit No    Medication changes reported     Yes    Comments OTC Mg added for cramps.    Fall or balance concerns reported    No    Tobacco Cessation No Change    Warm-up and Cool-down Performed as group-led instruction    Resistance Training Performed Yes    VAD Patient? No    PAD/SET Patient? No      Pain Assessment   Currently in Pain? No/denies    Pain Score 0-No pain    Multiple Pain Sites No             Capillary Blood Glucose: No results found for this or any previous visit (from the past 24 hour(s)).    Social History   Tobacco Use  Smoking Status Never  Smokeless Tobacco Never    Goals Met:  Independence with exercise equipment Exercise tolerated well No report of concerns or symptoms today Strength training completed today  Goals Unmet:  Not Applicable  Comments: Check 915.   Dr. Dina Rich is Medical Director for [facility] Cardiac Rehab

## 2022-12-06 ENCOUNTER — Encounter (HOSPITAL_COMMUNITY)
Admission: RE | Admit: 2022-12-06 | Discharge: 2022-12-06 | Disposition: A | Discharge status: Home / Self Care | Payer: Medicare Other | Source: Ambulatory Visit | Attending: Cardiology | Admitting: Cardiology

## 2022-12-06 DIAGNOSIS — Z955 Presence of coronary angioplasty implant and graft: Secondary | ICD-10-CM

## 2022-12-06 NOTE — Progress Notes (Signed)
Daily Session Note  Patient Details  Name: Richard Nolan. MRN: [id number] Date of Birth: [birth year] Referring Provider:   Flowsheet Row CARDIAC REHAB PHASE II ORIENTATION from 10/02/2022 in [facility] CARDIAC REHABILITATION  Referring Provider Dr. Clifton James       Encounter Date: 12/06/2022  Check In:  Session Check In - 12/06/22 0815       Check-In   Supervising physician immediately available to respond to emergencies [facility] MD immediately available    Physician(s) Dr. Jenene Slicker    Location [facility]    Staff Present Erskine Speed, RN;Debra Laural Benes, RN, BSN;Other    Virtual Visit No    Medication changes reported     No    Fall or balance concerns reported    No    Tobacco Cessation No Change    Warm-up and Cool-down Performed as group-led instruction    Resistance Training Performed Yes    VAD Patient? No    PAD/SET Patient? No      Pain Assessment   Currently in Pain? No/denies    Pain Score 0-No pain    Multiple Pain Sites No             Capillary Blood Glucose: No results found for this or any previous visit (from the past 24 hour(s)).    Social History   Tobacco Use  Smoking Status Never  Smokeless Tobacco Never    Goals Met:  Independence with exercise equipment Exercise tolerated well No report of concerns or symptoms today Strength training completed today  Goals Unmet:  Not Applicable  Comments: check out @ 9:15am   Dr. Dina Rich is Medical Director for [facility] Cardiac Rehab

## 2022-12-09 ENCOUNTER — Encounter (HOSPITAL_COMMUNITY)
Admission: RE | Admit: 2022-12-09 | Discharge: 2022-12-09 | Disposition: A | Discharge status: Home / Self Care | Payer: Medicare Other | Source: Ambulatory Visit | Attending: Cardiology | Admitting: Cardiology

## 2022-12-09 DIAGNOSIS — Z955 Presence of coronary angioplasty implant and graft: Secondary | ICD-10-CM

## 2022-12-09 NOTE — Progress Notes (Signed)
Daily Session Note  Patient Details  Name: Richard Nolan. MRN: [id number] Date of Birth: 11/13/1955 Referring Provider:   Flowsheet Row CARDIAC REHAB PHASE II ORIENTATION from 10/02/2022 in [facility] CARDIAC REHABILITATION  Referring Provider Dr. Clifton James       Encounter Date: 12/09/2022  Check In:  Session Check In - 12/09/22 0815       Check-In   Supervising physician immediately available to respond to emergencies [facility] MD immediately available    Physician(s) Dr. Wyline Mood    Location [facility]    Staff Present Ross Ludwig, BS, Exercise Physiologist;Phyllis Billingsley, Margarite Gouge, RN, BSN    Medication changes reported     No    Fall or balance concerns reported    No    Tobacco Cessation No Change    Warm-up and Cool-down Performed as group-led instruction    Resistance Training Performed Yes    VAD Patient? No    PAD/SET Patient? No      Pain Assessment   Currently in Pain? No/denies    Pain Score 0-No pain    Multiple Pain Sites No             Capillary Blood Glucose: No results found for this or any previous visit (from the past 24 hour(s)).    Social History   Tobacco Use  Smoking Status Never  Smokeless Tobacco Never    Goals Met:  Independence with exercise equipment Exercise tolerated well No report of concerns or symptoms today Strength training completed today  Goals Unmet:  Not Applicable  Comments: check out 0915   Dr. Dina Rich is Medical Director for [facility] Cardiac Rehab

## 2022-12-11 ENCOUNTER — Encounter (HOSPITAL_COMMUNITY)
Admission: RE | Admit: 2022-12-11 | Discharge: 2022-12-11 | Disposition: A | Discharge status: Home / Self Care | Payer: Medicare Other | Source: Ambulatory Visit | Attending: Cardiology | Admitting: Cardiology

## 2022-12-11 DIAGNOSIS — Z955 Presence of coronary angioplasty implant and graft: Secondary | ICD-10-CM

## 2022-12-11 NOTE — Progress Notes (Signed)
Daily Session Note  Patient Details  Name: Richard Nolan. MRN: [id number] Date of Birth: 1955-12-21 Referring Provider:   Flowsheet Row CARDIAC REHAB PHASE II ORIENTATION from 10/02/2022 in [facility] CARDIAC REHABILITATION  Referring Provider Dr. Clifton James       Encounter Date: 12/11/2022  Check In:  Session Check In - 12/11/22 0815       Check-In   Supervising physician immediately available to respond to emergencies [facility] MD immediately available    Physician(s) Dr. Dina Rich    Location [facility]    Staff Present Ross Ludwig, BS, Exercise Physiologist;Hillary Troutman BSN, RN;Edman Lipsey Laural Benes, RN, BSN    Virtual Visit No    Medication changes reported     No    Fall or balance concerns reported    No    Tobacco Cessation No Change    Warm-up and Cool-down Performed as group-led instruction    Resistance Training Performed Yes    VAD Patient? No    PAD/SET Patient? No      Pain Assessment   Currently in Pain? No/denies    Pain Score 0-No pain    Multiple Pain Sites No             Capillary Blood Glucose: No results found for this or any previous visit (from the past 24 hour(s)).    Social History   Tobacco Use  Smoking Status Never  Smokeless Tobacco Never    Goals Met:  Independence with exercise equipment Exercise tolerated well No report of concerns or symptoms today Strength training completed today  Goals Unmet:  Not Applicable  Comments: Check out 915.   Dr. Dina Rich is Medical Director for [facility] Cardiac Rehab

## 2022-12-13 ENCOUNTER — Encounter (HOSPITAL_COMMUNITY)
Admission: RE | Admit: 2022-12-13 | Discharge: 2022-12-13 | Disposition: A | Discharge status: Home / Self Care | Payer: Medicare Other | Source: Ambulatory Visit | Attending: Cardiology | Admitting: Cardiology

## 2022-12-13 DIAGNOSIS — Z955 Presence of coronary angioplasty implant and graft: Secondary | ICD-10-CM

## 2022-12-13 NOTE — Progress Notes (Signed)
Daily Session Note  Patient Details  Name: Richard Nolan. MRN: [id number] Date of Birth: Sep 09, 1955 Referring Provider:   Flowsheet Row CARDIAC REHAB PHASE II ORIENTATION from 10/02/2022 in [facility] CARDIAC REHABILITATION  Referring Provider Dr. Clifton James       Encounter Date: 12/13/2022  Check In:  Session Check In - 12/13/22 0815       Check-In   Supervising physician immediately available to respond to emergencies [facility] MD immediately available    Physician(s) Dr. Dina Rich    Location [facility]    Staff Present Ross Ludwig, BS, Exercise Physiologist;Daphyne Daphine Deutscher, RN, BSN    Virtual Visit No    Medication changes reported     No    Fall or balance concerns reported    No    Tobacco Cessation No Change    Warm-up and Cool-down Performed as group-led instruction    Resistance Training Performed Yes    VAD Patient? No    PAD/SET Patient? No      Pain Assessment   Currently in Pain? No/denies    Pain Score 0-No pain    Multiple Pain Sites No             Capillary Blood Glucose: No results found for this or any previous visit (from the past 24 hour(s)).    Social History   Tobacco Use  Smoking Status Never  Smokeless Tobacco Never    Goals Met:  Independence with exercise equipment Exercise tolerated well Personal goals reviewed Strength training completed today  Goals Unmet:  Not Applicable  Comments: check out 0915   Dr. Dina Rich is Medical Director for [facility]

## 2022-12-16 ENCOUNTER — Encounter (HOSPITAL_COMMUNITY)
Admission: RE | Admit: 2022-12-16 | Discharge: 2022-12-16 | Disposition: A | Discharge status: Home / Self Care | Payer: Medicare Other | Source: Ambulatory Visit | Attending: Cardiology | Admitting: Cardiology

## 2022-12-16 VITALS — Wt 245.6 lb

## 2022-12-16 DIAGNOSIS — Z955 Presence of coronary angioplasty implant and graft: Secondary | ICD-10-CM | POA: Diagnosis not present

## 2022-12-16 NOTE — Progress Notes (Signed)
Daily Session Note  Patient Details  Name: Richard Nolan. MRN: [id number] Date of Birth: 1956/01/11 Referring Provider:   Flowsheet Row CARDIAC REHAB PHASE II ORIENTATION from 10/02/2022 in [facility] CARDIAC REHABILITATION  Referring Provider Dr. Clifton James       Encounter Date: 12/16/2022  Check In:  Session Check In - 12/16/22 0815       Check-In   Supervising physician immediately available to respond to emergencies [facility] immediately available    Physician(s) Dr. Tenny Craw    Location [facility]    Staff Present Ross Ludwig, BS, Exercise Physiologist;Debra Laural Benes, RN, BSN    Virtual Visit No    Medication changes reported     No    Fall or balance concerns reported    No    Tobacco Cessation No Change    Warm-up and Cool-down Performed as group-led instruction    Resistance Training Performed Yes    VAD Patient? No    PAD/SET Patient? No      Pain Assessment   Currently in Pain? No/denies    Pain Score 0-No pain    Multiple Pain Sites No             Capillary Blood Glucose: No results found for this or any previous visit (from the past 24 hour(s)).    Social History   Tobacco Use  Smoking Status Never  Smokeless Tobacco Never    Goals Met:  Independence with exercise equipment Exercise tolerated well No report of concerns or symptoms today Strength training completed today  Goals Unmet:  Not Applicable  Comments: check out 0915   Dr. Dina Rich is Medical Director for [facility] Card[facility]

## 2022-12-18 ENCOUNTER — Encounter (HOSPITAL_COMMUNITY)
Admission: RE | Admit: 2022-12-18 | Discharge: 2022-12-18 | Disposition: A | Discharge status: Home / Self Care | Payer: Medicare Other | Source: Ambulatory Visit | Attending: Cardiology | Admitting: Cardiology

## 2022-12-18 DIAGNOSIS — Z955 Presence of coronary angioplasty implant and graft: Secondary | ICD-10-CM

## 2022-12-18 IMAGING — CT CT CARDIAC CORONARY ARTERY CALCIUM SCORE
2 of 3 series · 8 of 20 positions shown, 10 images · non-contrast
Comparison: None.
COMPARISON: None.

Addendum:
EXAM:
OVER-READ INTERPRETATION  CT CHEST

The following report is an over-read performed by radiologist Dr.
Kzng Jumpp [REDACTED] on 08/08/2021. This
over-read does not include interpretation of cardiac or coronary
anatomy or pathology. The coronary calcium score interpretation by
the cardiologist is attached.
CLINICAL DATA: Cardiovascular Disease Risk stratification
Coronary Calcium Score
TECHNIQUE: A gated, non-contrast computed tomography scan of the heart was
performed using 3mm slice thickness. Axial images were analyzed on a
dedicated workstation. Calcium scoring of the coronary arteries was
performed using the Agatston method.

[Series 2: cascseq 3.0 qr36 70% · axial · 0.75mm/px · z∈[+1375,+1420]mm · 2 of 45 slices shown]
[im 15/45  vessel]
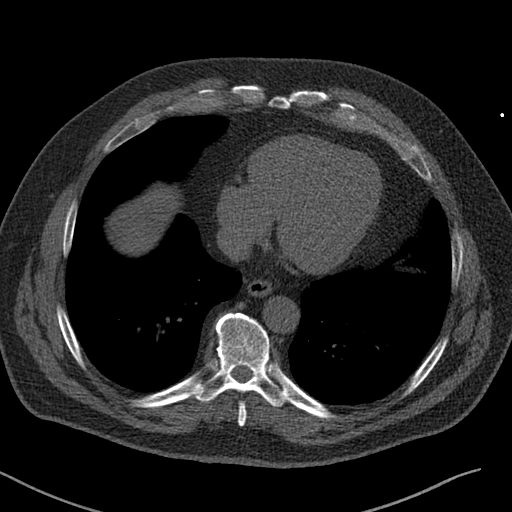
[im 30/45  vessel]
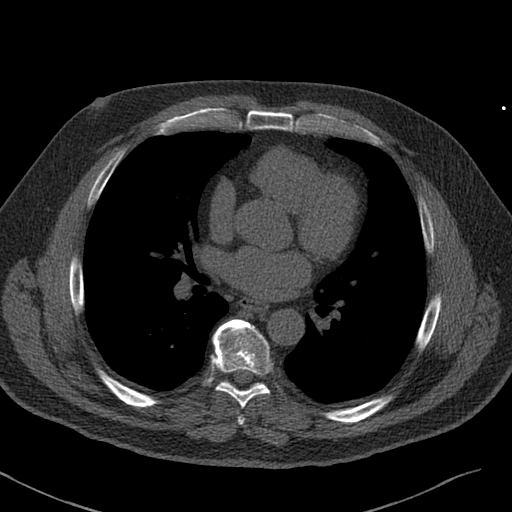

[Series 4: ax lung · axial · 0.91mm/px · z∈[+1350,+1446]mm · 6 of 68 slices shown, 8 images]
[im 10/68  vessel]
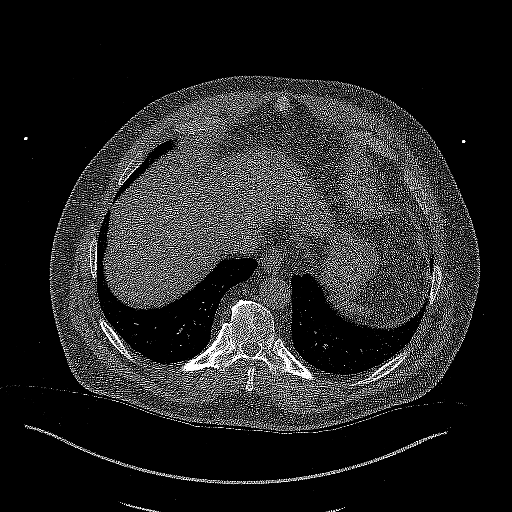
[im 10/68  lung]
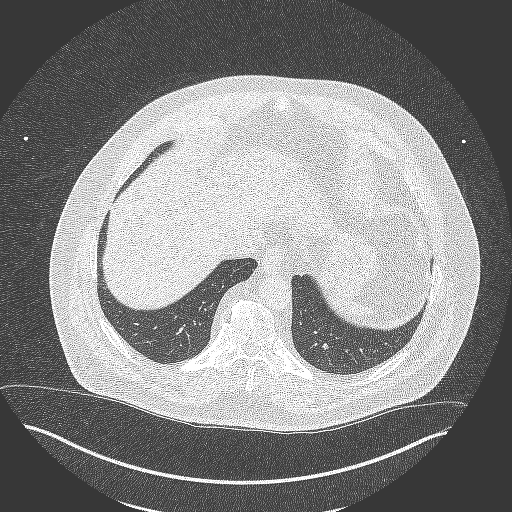
[im 20/68  vessel]
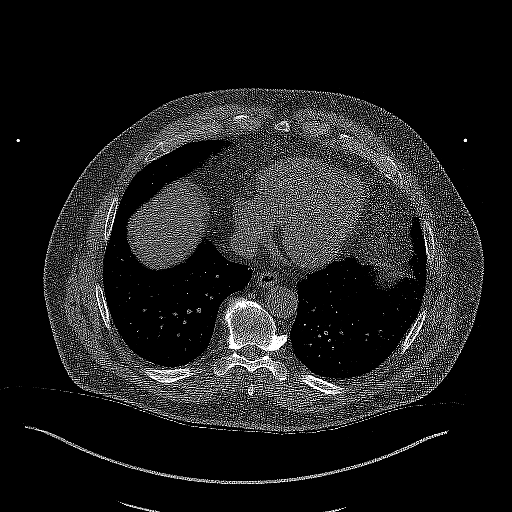
[im 29/68  vessel]
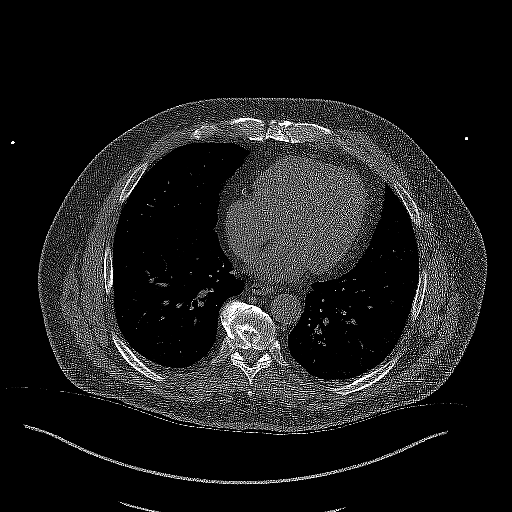
[im 39/68  vessel]
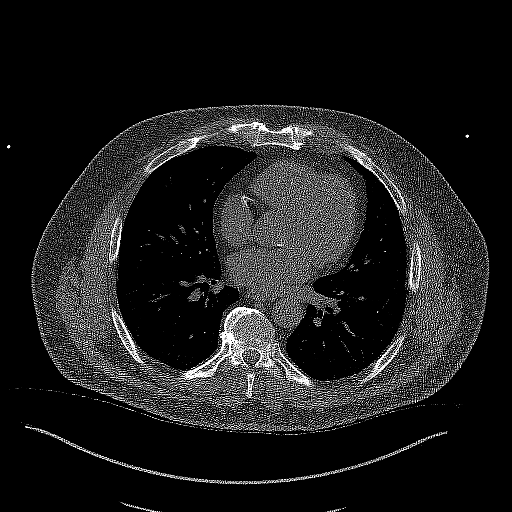
[im 48/68  vessel]
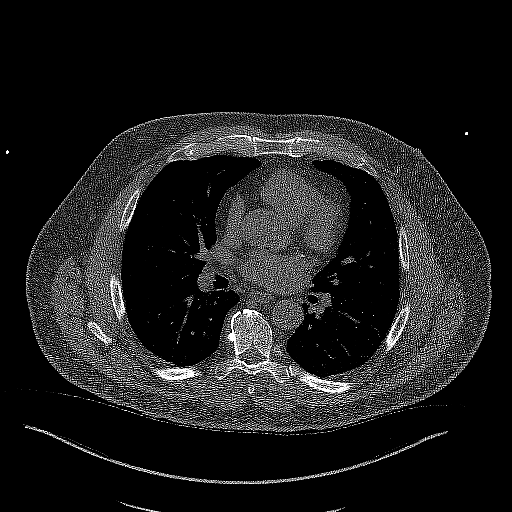
[im 48/68  lung]
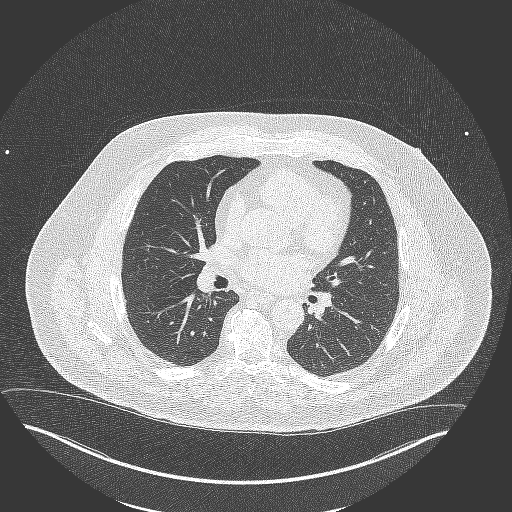
[im 58/68  vessel]
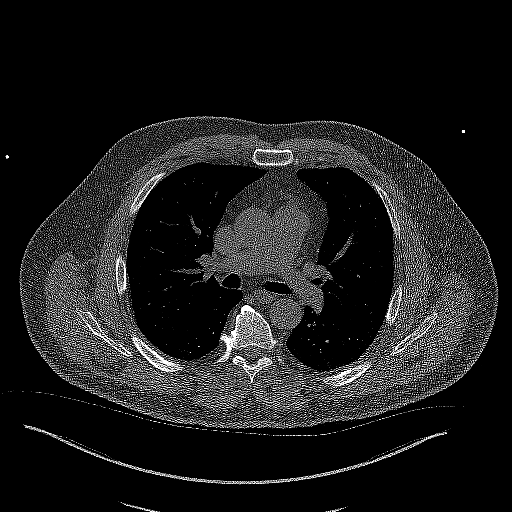

[8 of 20 positions shown; findings below may reference images not displayed]

FINDINGS: Atherosclerotic calcifications are noted in the thoracic aorta.
Within the visualized portions of the thorax there are no suspicious
appearing pulmonary nodules or masses, there is no acute
consolidative airspace disease, no pleural effusions, no
pneumothorax and no lymphadenopathy. Visualized portions of the
upper abdomen are unremarkable. There are no aggressive appearing
lytic or blastic lesions noted in the visualized portions of the
skeleton.
IMPRESSION: 1.  Aortic Atherosclerosis (QZJK5-JIW.W).
FINDINGS: Coronary arteries: Normal origins.

Coronary Calcium Score:

Left main: 91

Left anterior descending artery: 74

Left circumflex artery: 31

Right coronary artery: 347

Total: 543

Percentile: 80th

Pericardium: Normal.

Ascending Aorta: Mild dilatation measuring 38mm

Non-cardiac: See separate report from [REDACTED].
IMPRESSION: 1. Coronary calcium score of 543. This was 80th percentile for age-,
race-, and sex-matched controls.

2.  Mild dilatation of ascending aorta measuring 38mm



If CAC=0, it is reasonable to withhold statin therapy and reassess
in 5 to 10 years, as long as higher risk conditions are absent
(diabetes mellitus, family history of premature CHD in first degree
relatives (males <55 years; females <65 years), cigarette smoking,
or LDL >=190 mg/dL).

If CAC is 1 to 99, it is reasonable to initiate statin therapy for
patients >=55 years of age.

If CAC is >=100 or >=75th percentile, it is reasonable to initiate
statin therapy at any age.

Cardiology referral should be considered for patients with CAC
scores >=400 or >=75th percentile.

*6937 AHA/ACC/AACVPR/AAPA/ABC/LUO/AMBROISE/POCHON/Hilde Kathrine/KEIRY/CARRUTHERS/FIONA
Guideline on the Management of Blood Cholesterol: A Report of the
American College of Cardiology/American Heart Association Task Force
on Clinical Practice Guidelines. J Am Coll Cardiol.
4288;73(24):8657-8483.

*** End of Addendum ***
EXAM:
OVER-READ INTERPRETATION  CT CHEST

The following report is an over-read performed by radiologist Dr.
Kzng Jumpp [REDACTED] on 08/08/2021. This
over-read does not include interpretation of cardiac or coronary
anatomy or pathology. The coronary calcium score interpretation by
the cardiologist is attached.
FINDINGS: Atherosclerotic calcifications are noted in the thoracic aorta.
Within the visualized portions of the thorax there are no suspicious
appearing pulmonary nodules or masses, there is no acute
consolidative airspace disease, no pleural effusions, no
pneumothorax and no lymphadenopathy. Visualized portions of the
upper abdomen are unremarkable. There are no aggressive appearing
lytic or blastic lesions noted in the visualized portions of the
skeleton.
IMPRESSION: 1.  Aortic Atherosclerosis (QZJK5-JIW.W).

## 2022-12-18 NOTE — Progress Notes (Signed)
Daily Session Note  Patient Details  Name: Richard Nolan. MRN: [id number] Date of Birth: 1956-04-17 Referring Provider:   Flowsheet Row CARDIAC REHAB PHASE II ORIENTATION from 10/02/2022 in [facility] CARDIAC REHABILITATION  Referring Provider Dr. Clifton James       Encounter Date: 12/18/2022  Check In:  Session Check In - 12/18/22 0815       Check-In   Supervising physician immediately available to respond to emergencies [facility] MD immediately available    Physician(s) Dr. Diona Browner    Location [facility]    Staff Present Ross Ludwig, BS, Exercise Physiologist;Aalijah Mims BSN, RN    Virtual Visit No    Medication changes reported     No    Fall or balance concerns reported    No    Tobacco Cessation No Change    Warm-up and Cool-down Performed as group-led instruction    Resistance Training Performed Yes    VAD Patient? No    PAD/SET Patient? No      Pain Assessment   Currently in Pain? No/denies    Pain Score 0-No pain    Multiple Pain Sites No             Capillary Blood Glucose: No results found for this or any previous visit (from the past 24 hour(s)).    Social History   Tobacco Use  Smoking Status Never  Smokeless Tobacco Never    Goals Met:  Independence with exercise equipment Exercise tolerated well No report of concerns or symptoms today Strength training completed today  Goals Unmet:  Not Applicable  Comments: check out at 9:15   Dr. Dina Rich is Medical Director for [facility] Card[facility]

## 2022-12-18 NOTE — Progress Notes (Signed)
Cardiac Individual Treatment Plan  Patient Details  Name: Richard Nolan. MRN: [id number] Date of Birth: 1955-07-22 Referring Provider:   Flowsheet Row CARDIAC REHAB PHASE II ORIENTATION from 10/02/2022 in [facility] CARDIAC REHABILITATION  Referring Provider Dr. Clifton James       Initial Encounter Date:  Flowsheet Row CARDIAC REHAB PHASE II ORIENTATION from 10/02/2022 in [place] CARDIAC REHABILITATION  Date 10/02/22       Visit Diagnosis: Status post coronary artery stent placement  Patient's Home Medications on Admission:  Current Outpatient Medications:    allopurinol (ZYLOPRIM) 100 MG tablet, Take 100 mg by mouth in the morning., Disp: , Rfl:    aspirin EC 81 MG tablet, Take 81 mg by mouth in the morning., Disp: , Rfl:    calcium carbonate (TUMS - DOSED IN MG ELEMENTAL CALCIUM) 500 MG chewable tablet, Chew 1 tablet by mouth daily as needed for indigestion or heartburn., Disp: , Rfl:    clopidogrel (PLAVIX) 75 MG tablet, Take 1 tablet (75 mg total) by mouth daily., Disp: 90 tablet, Rfl: 1   metoprolol succinate (TOPROL XL) 25 MG 24 hr tablet, Take 1 tablet (25 mg total) by mouth daily., Disp: 30 tablet, Rfl: 11   nitroGLYCERIN (NITROSTAT) 0.4 MG SL tablet, Place 1 tablet (0.4 mg total) under the tongue every 5 (five) minutes as needed for chest pain., Disp: 25 tablet, Rfl: 1   rosuvastatin (CRESTOR) 20 MG tablet, Take 20 mg by mouth at bedtime., Disp: , Rfl:   Past Medical History: Past Medical History:  Diagnosis Date   Aortic dilatation (HCC) 08/2022   38mm   CAD (coronary artery disease) 08/29/2022   DES 1st dx   Hyperlipidemia    Hypertension    OSA (obstructive sleep apnea)    CPAP nightly   Stenosing tenosynovitis of finger of left hand    left index and left middle fingers    Tobacco Use: Social History   Tobacco Use  Smoking Status Never  Smokeless Tobacco Never    Labs: Review Flowsheet       Latest Ref Rng & Units 08/15/2022  Labs for ITP Cardiac  and Pulmonary Rehab  Cholestrol 100 - 199 mg/dL 409   LDL (calc) 0 - 99 mg/dL 42   HDL-C >81 mg/dL 42   Trlycerides 0 - 191 mg/dL 87     Capillary Blood Glucose: No results found for: "GLUCAP"   Exercise Target Goals: Exercise Program Goal: Individual exercise prescription set using results from initial 6 min walk test and THRR while considering  patient's activity barriers and safety.   Exercise Prescription Goal: Starting with aerobic activity 30 plus minutes a day, 3 days per week for initial exercise prescription. Provide home exercise prescription and guidelines that participant acknowledges understanding prior to discharge.  Activity Barriers & Risk Stratification:  Activity Barriers & Cardiac Risk Stratification - 10/02/22 1257       Activity Barriers & Cardiac Risk Stratification   Activity Barriers Arthritis;Back Problems;Neck/Spine Problems;Joint Problems;Deconditioning;Shortness of Breath    Cardiac Risk Stratification High             6 Minute Walk:  6 Minute Walk     Row Name 10/02/22 1404         6 Minute Walk   Phase Initial     Distance 1500 feet     Walk Time 6 minutes     # of Rest Breaks 0     MPH 2.84  METS 2.62     RPE 11     VO2 Peak 9.18     Symptoms No     Resting HR 65 bpm     Resting BP 126/80     Resting Oxygen Saturation  96 %     Exercise Oxygen Saturation  during 6 min walk 94 %     Max Ex. HR 82 bpm     Max Ex. BP 140/80     2 Minute Post BP 120/70              Oxygen Initial Assessment:   Oxygen Re-Evaluation:   Oxygen Discharge (Final Oxygen Re-Evaluation):   Initial Exercise Prescription:  Initial Exercise Prescription - 10/02/22 1400       Date of Initial Exercise RX and Referring Provider   Date 10/02/22    Referring Provider Dr. Clifton James    Expected Discharge Date 12/25/22      Treadmill   MPH 2    Grade 0    Minutes 17      Recumbant Elliptical   Level 1    RPM 45    Minutes 22       Prescription Details   Frequency (times per week) 3    Duration Progress to 30 minutes of continuous aerobic without signs/symptoms of physical distress      Intensity   THRR 40-80% of Max Heartrate 61-122    Ratings of Perceived Exertion 11-13      Resistance Training   Training Prescription Yes    Weight 3    Reps 10-15             Perform Capillary Blood Glucose checks as needed.  Exercise Prescription Changes:   Exercise Prescription Changes     Row Name 10/07/22 1000 10/21/22 1300 11/04/22 1200 11/18/22 1100 12/02/22 1200     Response to Exercise   Blood Pressure (Admit) 134/62 116/56 124/60 110/70 134/64   Blood Pressure (Exercise) 132/70 136/60 138/62 140/60 150/70   Blood Pressure (Exit) 142/60 126/60 118/60 132/74 110/60   Heart Rate (Admit) 71 bpm 67 bpm 71 bpm 65 bpm 78 bpm   Heart Rate (Exercise) 110 bpm 104 bpm 88 bpm 101 bpm 100 bpm   Heart Rate (Exit) 82 bpm 77 bpm 80 bpm 73 bpm 87 bpm   Rating of Perceived Exertion (Exercise) 12 11 11 11 12    Duration Continue with 30 min of aerobic exercise without signs/symptoms of physical distress. Continue with 30 min of aerobic exercise without signs/symptoms of physical distress. Continue with 30 min of aerobic exercise without signs/symptoms of physical distress. Continue with 30 min of aerobic exercise without signs/symptoms of physical distress. Continue with 30 min of aerobic exercise without signs/symptoms of physical distress.   Intensity THRR unchanged THRR unchanged THRR unchanged THRR unchanged THRR unchanged     Progression   Progression Continue to progress workloads to maintain intensity without signs/symptoms of physical distress. Continue to progress workloads to maintain intensity without signs/symptoms of physical distress. Continue to progress workloads to maintain intensity without signs/symptoms of physical distress. Continue to progress workloads to maintain intensity without signs/symptoms of physical  distress. Continue to progress workloads to maintain intensity without signs/symptoms of physical distress.     Resistance Training   Training Prescription Yes Yes Yes Yes Yes   Weight 4 4 4 8 8    Reps 10-15 10-15 10-15 10-15 10-15   Time 10 Minutes 10 Minutes 10 Minutes 10 Minutes 10 Minutes  Treadmill   MPH 2.4 2.4 2.2 2.2 2   Grade 0 1 4 5 7    Minutes 22 17 17 17 17    METs 2.84 3.17 3.9 4.2 4.46     Recumbant Elliptical   Level 2 5 5 5 5    RPM 65 60 57 62 53   Minutes 17 22 22 22 22    METs 2.8 5.3 3.3 5.9 5    Row Name 12/16/22 1100             Response to Exercise   Blood Pressure (Admit) 108/80       Blood Pressure (Exercise) 140/66       Blood Pressure (Exit) 120/62       Heart Rate (Admit) 68 bpm       Heart Rate (Exercise) 97 bpm       Heart Rate (Exit) 77 bpm       Rating of Perceived Exertion (Exercise) 11       Duration Continue with 30 min of aerobic exercise without signs/symptoms of physical distress.       Intensity THRR unchanged         Progression   Progression Continue to progress workloads to maintain intensity without signs/symptoms of physical distress.         Resistance Training   Training Prescription Yes       Weight 8       Reps 10-15       Time 10 Minutes         Treadmill   MPH 2.4       Grade 3       Minutes 17       METs 3.82         Recumbant Elliptical   Level 5       RPM 55       Minutes 22       METs 4.9                Exercise Comments:   Exercise Goals and Review:   Exercise Goals     Row Name 10/02/22 1408 10/21/22 1323 11/18/22 1152 12/16/22 1158       Exercise Goals   Increase Physical Activity Yes Yes Yes Yes    Intervention Provide advice, education, support and counseling about physical activity/exercise needs.;Develop an individualized exercise prescription for aerobic and resistive training based on initial evaluation findings, risk stratification, comorbidities and participant's personal  goals. Provide advice, education, support and counseling about physical activity/exercise needs.;Develop an individualized exercise prescription for aerobic and resistive training based on initial evaluation findings, risk stratification, comorbidities and participant's personal goals. Provide advice, education, support and counseling about physical activity/exercise needs.;Develop an individualized exercise prescription for aerobic and resistive training based on initial evaluation findings, risk stratification, comorbidities and participant's personal goals. Provide advice, education, support and counseling about physical activity/exercise needs.;Develop an individualized exercise prescription for aerobic and resistive training based on initial evaluation findings, risk stratification, comorbidities and participant's personal goals.    Expected Outcomes Short Term: Attend rehab on a regular basis to increase amount of physical activity.;Long Term: Add in home exercise to make exercise part of routine and to increase amount of physical activity.;Long Term: Exercising regularly at least 3-5 days a week. Short Term: Attend rehab on a regular basis to increase amount of physical activity.;Long Term: Add in home exercise to make exercise part of routine and to increase amount of physical activity.;Long Term: Exercising regularly at  least 3-5 days a week. Short Term: Attend rehab on a regular basis to increase amount of physical activity.;Long Term: Add in home exercise to make exercise part of routine and to increase amount of physical activity.;Long Term: Exercising regularly at least 3-5 days a week. Short Term: Attend rehab on a regular basis to increase amount of physical activity.;Long Term: Add in home exercise to make exercise part of routine and to increase amount of physical activity.;Long Term: Exercising regularly at least 3-5 days a week.    Increase Strength and Stamina Yes Yes Yes Yes    Intervention  Provide advice, education, support and counseling about physical activity/exercise needs.;Develop an individualized exercise prescription for aerobic and resistive training based on initial evaluation findings, risk stratification, comorbidities and participant's personal goals. Provide advice, education, support and counseling about physical activity/exercise needs.;Develop an individualized exercise prescription for aerobic and resistive training based on initial evaluation findings, risk stratification, comorbidities and participant's personal goals. Provide advice, education, support and counseling about physical activity/exercise needs.;Develop an individualized exercise prescription for aerobic and resistive training based on initial evaluation findings, risk stratification, comorbidities and participant's personal goals. Provide advice, education, support and counseling about physical activity/exercise needs.;Develop an individualized exercise prescription for aerobic and resistive training based on initial evaluation findings, risk stratification, comorbidities and participant's personal goals.    Expected Outcomes Short Term: Increase workloads from initial exercise prescription for resistance, speed, and METs.;Short Term: Perform resistance training exercises routinely during rehab and add in resistance training at home;Long Term: Improve cardiorespiratory fitness, muscular endurance and strength as measured by increased METs and functional capacity ( ) Short Term: Increase workloads from initial exercise prescription for resistance, speed, and METs.;Short Term: Perform resistance training exercises routinely during rehab and add in resistance training at home;Long Term: Improve cardiorespiratory fitness, muscular endurance and strength as measured by increased METs and functional capacity ( ) Short Term: Increase workloads from initial exercise prescription for resistance, speed, and METs.;Short  Term: Perform resistance training exercises routinely during rehab and add in resistance training at home;Long Term: Improve cardiorespiratory fitness, muscular endurance and strength as measured by increased METs and functional capacity ( ) Short Term: Increase workloads from initial exercise prescription for resistance, speed, and METs.;Short Term: Perform resistance training exercises routinely during rehab and add in resistance training at home;Long Term: Improve cardiorespiratory fitness, muscular endurance and strength as measured by increased METs and functional capacity ( )    Able to understand and use rate of perceived exertion (RPE) scale Yes Yes Yes Yes    Intervention Provide education and explanation on how to use RPE scale Provide education and explanation on how to use RPE scale Provide education and explanation on how to use RPE scale Provide education and explanation on how to use RPE scale    Expected Outcomes Short Term: Able to use RPE daily in rehab to express subjective intensity level;Long Term:  Able to use RPE to guide intensity level when exercising independently Short Term: Able to use RPE daily in rehab to express subjective intensity level;Long Term:  Able to use RPE to guide intensity level when exercising independently Short Term: Able to use RPE daily in rehab to express subjective intensity level;Long Term:  Able to use RPE to guide intensity level when exercising independently Short Term: Able to use RPE daily in rehab to express subjective intensity level;Long Term:  Able to use RPE to guide intensity level when exercising independently    Knowledge and understanding of Target Heart Rate Range (THRR)  Yes Yes Yes Yes    Intervention Provide education and explanation of THRR including how the numbers were predicted and where they are located for reference Provide education and explanation of THRR including how the numbers were predicted and where they are located for  reference Provide education and explanation of THRR including how the numbers were predicted and where they are located for reference Provide education and explanation of THRR including how the numbers were predicted and where they are located for reference    Expected Outcomes Short Term: Able to state/look up THRR;Long Term: Able to use THRR to govern intensity when exercising independently;Short Term: Able to use daily as guideline for intensity in rehab Short Term: Able to state/look up THRR;Long Term: Able to use THRR to govern intensity when exercising independently;Short Term: Able to use daily as guideline for intensity in rehab Short Term: Able to state/look up THRR;Long Term: Able to use THRR to govern intensity when exercising independently;Short Term: Able to use daily as guideline for intensity in rehab Short Term: Able to state/look up THRR;Long Term: Able to use THRR to govern intensity when exercising independently;Short Term: Able to use daily as guideline for intensity in rehab    Able to check pulse independently Yes Yes Yes Yes    Intervention Provide education and demonstration on how to check pulse in carotid and radial arteries.;Review the importance of being able to check your own pulse for safety during independent exercise Provide education and demonstration on how to check pulse in carotid and radial arteries.;Review the importance of being able to check your own pulse for safety during independent exercise Provide education and demonstration on how to check pulse in carotid and radial arteries.;Review the importance of being able to check your own pulse for safety during independent exercise Provide education and demonstration on how to check pulse in carotid and radial arteries.;Review the importance of being able to check your own pulse for safety during independent exercise    Expected Outcomes Long Term: Able to check pulse independently and accurately;Short Term: Able to explain  why pulse checking is important during independent exercise Long Term: Able to check pulse independently and accurately;Short Term: Able to explain why pulse checking is important during independent exercise Long Term: Able to check pulse independently and accurately;Short Term: Able to explain why pulse checking is important during independent exercise Long Term: Able to check pulse independently and accurately;Short Term: Able to explain why pulse checking is important during independent exercise    Understanding of Exercise Prescription Yes Yes Yes Yes    Intervention Provide education, explanation, and written materials on patient's individual exercise prescription Provide education, explanation, and written materials on patient's individual exercise prescription Provide education, explanation, and written materials on patient's individual exercise prescription Provide education, explanation, and written materials on patient's individual exercise prescription    Expected Outcomes Short Term: Able to explain program exercise prescription;Long Term: Able to explain home exercise prescription to exercise independently Short Term: Able to explain program exercise prescription;Long Term: Able to explain home exercise prescription to exercise independently Short Term: Able to explain program exercise prescription;Long Term: Able to explain home exercise prescription to exercise independently Short Term: Able to explain program exercise prescription;Long Term: Able to explain home exercise prescription to exercise independently             Exercise Goals Re-Evaluation :  Exercise Goals Re-Evaluation     Row Name 10/21/22 1324 11/18/22 1153 12/16/22 1159  Exercise Goal Re-Evaluation   Exercise Goals Review Increase Physical Activity;Increase Strength and Stamina;Able to understand and use rate of perceived exertion (RPE) scale;Knowledge and understanding of Target Heart Rate Range (THRR);Able  to check pulse independently;Understanding of Exercise Prescription Increase Physical Activity;Increase Strength and Stamina;Able to understand and use rate of perceived exertion (RPE) scale;Knowledge and understanding of Target Heart Rate Range (THRR);Able to check pulse independently;Understanding of Exercise Prescription Increase Physical Activity;Increase Strength and Stamina;Able to understand and use rate of perceived exertion (RPE) scale;Knowledge and understanding of Target Heart Rate Range (THRR);Understanding of Exercise Prescription;Able to check pulse independently     Comments Pt has completed 9 sessions of cardiac rehab. He is tolerating exercise well and has started to increase his workload. He is currently exercising at 5.3 METs on the ellp. Will continue to monitor and progress as able. Pt has completed 21 sessions of cardiac rehab. He continues to do well in the program and increasing his workloads. He sometimes uses just his arms on the ellp and has been asked if he would like to use the arm ergometer instead of the ellp but he does not. He is currently exercising at 5.9 METs on the ellp. Will continue to monitor and progress as able, Pt has completed 32 sessions of caridac rehab. He continues to progress in the program and is about to finish up his classes. He has improved his workloads over each class. He started to increase his grade on the treadmill to a 7 and started to get cramps in his calfs. We informed him that th cramps are due to walking at a high grade and to focuse more on the speed of walking then the grade while on the treadmill. He is currently exercising at 4.9 METs on the ellp. Will continue to monitor and progres as able.     Expected Outcomes Throught exercise at rehab and home, the patients will meet their stated goals. Throught exercise at rehab and home, the patients will meet their stated goals. Throught exercise at rehab and home, the patients will meet their stated  goals.               Discharge Exercise Prescription (Final Exercise Prescription Changes):  Exercise Prescription Changes - 12/16/22 1100       Response to Exercise   Blood Pressure (Admit) 108/80    Blood Pressure (Exercise) 140/66    Blood Pressure (Exit) 120/62    Heart Rate (Admit) 68 bpm    Heart Rate (Exercise) 97 bpm    Heart Rate (Exit) 77 bpm    Rating of Perceived Exertion (Exercise) 11    Duration Continue with 30 min of aerobic exercise without signs/symptoms of physical distress.    Intensity THRR unchanged      Progression   Progression Continue to progress workloads to maintain intensity without signs/symptoms of physical distress.      Resistance Training   Training Prescription Yes    Weight 8    Reps 10-15    Time 10 Minutes      Treadmill   MPH 2.4    Grade 3    Minutes 17    METs 3.82      Recumbant Elliptical   Level 5    RPM 55    Minutes 22    METs 4.9             Nutrition:  Target Goals: Understanding of nutrition guidelines, daily intake of sodium 1500mg , cholesterol 200mg , calories 30% from  fat and 7% or less from saturated fats, daily to have 5 or more servings of fruits and vegetables.  Biometrics:  Pre Biometrics - 10/02/22 1409       Pre Biometrics   Height 5\' 9"  (1.753 m)    Weight 116.8 kg    Waist Circumference 52 inches    Hip Circumference 49 inches    Waist to Hip Ratio 1.06 %    BMI (Calculated) 38.01    Triceps Skinfold 15 mm    % Body Fat 37 %    Grip Strength 25.7 kg    Flexibility 0 in    Single Leg Stand 20.21 seconds              Nutrition Therapy Plan and Nutrition Goals:  Nutrition Therapy & Goals - 12/09/22 1224       Nutrition Therapy   RD appointment deferred Yes      Personal Nutrition Goals   Comments We offer educatioal sessions on heart healthy nutrition and assistance with RD referral if patient is interested.      Intervention Plan   Intervention Nutrition handout(s)  given to patient.    Expected Outcomes Short Term Goal: Understand basic principles of dietary content, such as calories, fat, sodium, cholesterol and nutrients.             Nutrition Assessments:  Nutrition Assessments - 10/02/22 1312       MEDFICTS Scores   Pre Score 27            MEDIFICTS Score Key: ?70 Need to make dietary changes  40-70 Heart Healthy Diet ? 40 Therapeutic Level Cholesterol Diet   Picture Your Plate Scores: <16 Unhealthy dietary pattern with much room for improvement. 41-50 Dietary pattern unlikely to meet recommendations for good health and room for improvement. 51-60 More healthful dietary pattern, with some room for improvement.  >60 Healthy dietary pattern, although there may be some specific behaviors that could be improved.    Nutrition Goals Re-Evaluation:   Nutrition Goals Discharge (Final Nutrition Goals Re-Evaluation):   Psychosocial: Target Goals: Acknowledge presence or absence of significant depression and/or stress, maximize coping skills, provide positive support system. Participant is able to verbalize types and ability to use techniques and skills needed for reducing stress and depression.  Initial Review & Psychosocial Screening:  Initial Psych Review & Screening - 10/02/22 1305       Initial Review   Current issues with None Identified      Family Dynamics   Good Support System? Yes    Comments Pt has a good support system with his wife and his 3 children.      Barriers   Psychosocial barriers to participate in program There are no identifiable barriers or psychosocial needs.      Screening Interventions   Interventions Encouraged to exercise;To provide support and resources with identified psychosocial needs;Provide feedback about the scores to participant    Expected Outcomes Short Term goal: Identification and review with participant of any Quality of Life or Depression concerns found by scoring the  questionnaire.;Long Term goal: The participant improves quality of Life and PHQ9 Scores as seen by post scores and/or verbalization of changes             Quality of Life Scores:  Quality of Life - 10/02/22 1412       Quality of Life   Select Quality of Life      Quality of Life Scores   Health/Function  Pre 28.4 %    Socioeconomic Pre 30 %    Psych/Spiritual Pre 28.29 %    Family Pre 30 %    GLOBAL Pre 28.94 %            Scores of 19 and below usually indicate a poorer quality of life in these areas.  A difference of  2-3 points is a clinically meaningful difference.  A difference of 2-3 points in the total score of the Quality of Life Index has been associated with significant improvement in overall quality of life, self-image, physical symptoms, and general health in studies assessing change in quality of life.  PHQ-9: Review Flowsheet  More data exists      10/02/2022 05/09/2018 10/06/2017 07/23/2017 07/07/2015  Depression screen PHQ 2/9  Decreased Interest 0 0 0 0 0  Down, Depressed, Hopeless 0 0 0 0 0  PHQ - 2 Score 0 0 0 0 0  Altered sleeping 0 - - - -  Tired, decreased energy 0 - - - -  Change in appetite 0 - - - -  Feeling bad or failure about yourself  0 - - - -  Trouble concentrating 0 - - - -  Moving slowly or fidgety/restless 0 - - - -  Suicidal thoughts 0 - - - -  PHQ-9 Score 0 - - - -  Difficult doing work/chores Not difficult at all - - - -   Interpretation of Total Score  Total Score Depression Severity:  1-4 = Minimal depression, 5-9 = Mild depression, 10-14 = Moderate depression, 15-19 = Moderately severe depression, 20-27 = Severe depression   Psychosocial Evaluation and Intervention:  Psychosocial Evaluation - 10/02/22 1334       Psychosocial Evaluation & Interventions   Comments Pt has no identifiable psychosocial barriers.  He is a retired [employer] and has a good support system with his wife and 3 children.  His PHQ-9 was a zero.  His goals  for the program are to increase his strength/stamina and be able to breathe better.  He also wants to be able to play with his grandchildren.  We will continue to monitor his progress as he works toward these goals.    Expected Outcomes Pt will continue to have no identifiable psychosocial barriers.    Continue Psychosocial Services  No Follow up required             Psychosocial Re-Evaluation:  Psychosocial Re-Evaluation     Row Name 10/14/22 1512 11/13/22 0805 12/09/22 1224         Psychosocial Re-Evaluation   Current issues with None Identified None Identified None Identified     Comments Patient is new to the program. He has completed 5 sessions. He continues to have no psychosocial barriers identified. He seems to enjoy the sessions and demonstrates an interest in improving his health. We will continue to monitor his progress. Patient has completed 17 sessions. He continues to have no psychosocial barriers identified. He continues to enjoy the sessions and demonstrates an interest in improving his health. We will continue to monitor his progress. Patient has completed 28 sessions. He continues to have no psychosocial barriers identified. He continues to enjoy the sessions and demonstrates an interest in improving his health. We will continue to monitor his progress.     Expected Outcomes Patient will complete the program meeting both personal and program goals. Patient will complete the program meeting both personal and program goals. Patient will complete the program meeting  both personal and program goals.     Interventions Stress management education;Encouraged to attend Cardiac Rehabilitation for the exercise;Relaxation education Stress management education;Encouraged to attend Cardiac Rehabilitation for the exercise;Relaxation education Stress management education;Encouraged to attend Cardiac Rehabilitation for the exercise;Relaxation education     Continue Psychosocial Services  No  Follow up required No Follow up required No Follow up required              Psychosocial Discharge (Final Psychosocial Re-Evaluation):  Psychosocial Re-Evaluation - 12/09/22 1224       Psychosocial Re-Evaluation   Current issues with None Identified    Comments Patient has completed 28 sessions. He continues to have no psychosocial barriers identified. He continues to enjoy the sessions and demonstrates an interest in improving his health. We will continue to monitor his progress.    Expected Outcomes Patient will complete the program meeting both personal and program goals.    Interventions Stress management education;Encouraged to attend Cardiac Rehabilitation for the exercise;Relaxation education    Continue Psychosocial Services  No Follow up required             Vocational Rehabilitation: Provide vocational rehab assistance to qualifying candidates.   Vocational Rehab Evaluation & Intervention:  Vocational Rehab - 10/02/22 1320       Initial Vocational Rehab Evaluation & Intervention   Assessment shows need for Vocational Rehabilitation No      Vocational Rehab Re-Evaulation   Comments pt is retired.             Education: Education Goals: Education classes will be provided on a weekly basis, covering required topics. Participant will state understanding/return demonstration of topics presented.  Learning Barriers/Preferences:  Learning Barriers/Preferences - 10/02/22 1315       Learning Barriers/Preferences   Learning Barriers None    Learning Preferences Written Material;Verbal Instruction             Education Topics: Hypertension, Hypertension Reduction -Define heart disease and high blood pressure. Discus how high blood pressure affects the body and ways to reduce high blood pressure. Flowsheet Row CARDIAC REHAB PHASE II EXERCISE from 12/11/2022 in [place] CARDIAC REHABILITATION  Date 10/09/22  Educator HB  Instruction Review Code 1-  Verbalizes Understanding       Exercise and Your Heart -Discuss why it is important to exercise, the FITT principles of exercise, normal and abnormal responses to exercise, and how to exercise safely. Flowsheet Row CARDIAC REHAB PHASE II EXERCISE from 12/11/2022 in [place] CARDIAC REHABILITATION  Date 10/16/22  Educator HB  Instruction Review Code 1- Verbalizes Understanding       Angina -Discuss definition of angina, causes of angina, treatment of angina, and how to decrease risk of having angina. Flowsheet Row CARDIAC REHAB PHASE II EXERCISE from 12/11/2022 in [place] CARDIAC REHABILITATION  Date 10/23/22  Educator HB  Instruction Review Code 2- Demonstrated Understanding       Cardiac Medications -Review what the following cardiac medications are used for, how they affect the body, and side effects that may occur when taking the medications.  Medications include Aspirin, Beta blockers, calcium channel blockers, ACE Inhibitors, angiotensin receptor blockers, diuretics, digoxin, and antihyperlipidemics.   Congestive Heart Failure -Discuss the definition of CHF, how to live with CHF, the signs and symptoms of CHF, and how keep track of weight and sodium intake. Flowsheet Row CARDIAC REHAB PHASE II EXERCISE from 12/11/2022 in [place] CARDIAC REHABILITATION  Date 10/30/22  Educator HB  Instruction Review Code  1- Verbalizes Understanding       Heart Disease and Intimacy -Discus the effect sexual activity has on the heart, how changes occur during intimacy as we age, and safety during sexual activity. Flowsheet Row CARDIAC REHAB PHASE II EXERCISE from 12/11/2022 in [place] CARDIAC REHABILITATION  Date 11/06/22  Educator HB  Instruction Review Code 1- Verbalizes Understanding       Smoking Cessation / COPD -Discuss different methods to quit smoking, the health benefits of quitting smoking, and the definition of COPD. Flowsheet Row CARDIAC REHAB PHASE II EXERCISE  from 12/11/2022 in [place] CARDIAC REHABILITATION  Date 11/13/22  Educator hb  Instruction Review Code 2- Demonstrated Understanding       Nutrition I: Fats -Discuss the types of cholesterol, what cholesterol does to the heart, and how cholesterol levels can be controlled. Flowsheet Row CARDIAC REHAB PHASE II EXERCISE from 12/11/2022 in [place] CARDIAC REHABILITATION  Date 11/20/22  Educator HB  Instruction Review Code 1- Verbalizes Understanding       Nutrition II: Labels -Discuss the different components of food labels and how to read food label Flowsheet Row CARDIAC REHAB PHASE II EXERCISE from 12/11/2022 in [place] CARDIAC REHABILITATION  Date 11/27/22  Educator DJ  Instruction Review Code 1- Verbalizes Understanding       Heart Parts/Heart Disease and PAD -Discuss the anatomy of the heart, the pathway of blood circulation through the heart, and these are affected by heart disease. Flowsheet Row CARDIAC REHAB PHASE II EXERCISE from 12/11/2022 in [place] CARDIAC REHABILITATION  Date 12/04/22  Educator DJ  Instruction Review Code 1- Verbalizes Understanding       Stress I: Signs and Symptoms -Discuss the causes of stress, how stress may lead to anxiety and depression, and ways to limit stress. Flowsheet Row CARDIAC REHAB PHASE II EXERCISE from 12/11/2022 in [place] CARDIAC REHABILITATION  Date 12/11/22  Educator HB  Instruction Review Code 1- Verbalizes Understanding       Stress II: Relaxation -Discuss different types of relaxation techniques to limit stress.   Warning Signs of Stroke / TIA -Discuss definition of a stroke, what the signs and symptoms are of a stroke, and how to identify when someone is having stroke.   Knowledge Questionnaire Score:  Knowledge Questionnaire Score - 10/02/22 1317       Knowledge Questionnaire Score   Pre Score 22/24             Core Components/Risk Factors/Patient Goals at Admission:  Personal Goals  and Risk Factors at Admission - 10/02/22 1320       Core Components/Risk Factors/Patient Goals on Admission    Weight Management Yes;Weight Loss    Intervention Obesity: Provide education and appropriate resources to help participant work on and attain dietary goals.;Weight Management/Obesity: Establish reasonable short term and long term weight goals.;Weight Management: Provide education and appropriate resources to help participant work on and attain dietary goals.;Weight Management: Develop a combined nutrition and exercise program designed to reach desired caloric intake, while maintaining appropriate intake of nutrient and fiber, sodium and fats, and appropriate energy expenditure required for the weight goal.    Expected Outcomes Short Term: Continue to assess and modify interventions until short term weight is achieved;Long Term: Adherence to nutrition and physical activity/exercise program aimed toward attainment of established weight goal;Weight Maintenance: Understanding of the daily nutrition guidelines, which includes 25-35% calories from fat, 7% or less cal from saturated fats, less than 200mg  cholesterol, less than 1.5gm of sodium, &  5 or more servings of fruits and vegetables daily;Weight Loss: Understanding of general recommendations for a balanced deficit meal plan, which promotes 1-2 lb weight loss per week and includes a negative energy balance of [phone] kcal/d;Understanding recommendations for meals to include 15-35% energy as protein, 25-35% energy from fat, 35-60% energy from carbohydrates, less than 200mg  of dietary cholesterol, 20-35 gm of total fiber daily;Understanding of distribution of calorie intake throughout the day with the consumption of 4-5 meals/snacks    Improve shortness of breath with ADL's Yes    Intervention Provide education, individualized exercise plan and daily activity instruction to help decrease symptoms of SOB with activities of daily living.    Expected  Outcomes Short Term: Improve cardiorespiratory fitness to achieve a reduction of symptoms when performing ADLs;Long Term: Be able to perform more ADLs without symptoms or delay the onset of symptoms    Hypertension Yes    Intervention Provide education on lifestyle modifcations including regular physical activity/exercise, weight management, moderate sodium restriction and increased consumption of fresh fruit, vegetables, and low fat dairy, alcohol moderation, and smoking cessation.;Monitor prescription use compliance.    Expected Outcomes Short Term: Continued assessment and intervention until BP is < 140/38mm HG in hypertensive participants. < 130/51mm HG in hypertensive participants with diabetes, heart failure or chronic kidney disease.;Long Term: Maintenance of blood pressure at goal levels.    Lipids Yes    Intervention Provide education and support for participant on nutrition & aerobic/resistive exercise along with prescribed medications to achieve LDL 70mg , HDL >40mg .    Expected Outcomes Short Term: Participant states understanding of desired cholesterol values and is compliant with medications prescribed. Participant is following exercise prescription and nutrition guidelines.;Long Term: Cholesterol controlled with medications as prescribed, with individualized exercise RX and with personalized nutrition plan. Value goals: LDL < 70mg , HDL > 40 mg.    Personal Goal Other Yes    Personal Goal Pt wants to be able to breathe better and increase his strength/stamina.    Intervention Pt will attend CR 3 times a week and continue his home exercise program.    Expected Outcomes Pt will meet both the program and his personal goals.             Core Components/Risk Factors/Patient Goals Review:   Goals and Risk Factor Review     Row Name 10/14/22 1514 11/13/22 0806 12/09/22 1225         Core Components/Risk Factors/Patient Goals Review   Personal Goals Review Weight  Management/Obesity;Hypertension;Lipids;Improve shortness of breath with ADL's;Other Weight Management/Obesity;Hypertension;Lipids;Improve shortness of breath with ADL's;Other Weight Management/Obesity;Hypertension;Lipids;Improve shortness of breath with ADL's;Other     Review Patient was referred to CR with stent placement. He has multiple risk factors for CAD and is participating in the program for risk modification. He has completed 5 sessions. His current weight is 259 lbs maintained since his orientation visit. His blood pressure is at goal. His is doing well in the program with consistent attendance and progressions. His personal goals for the program are to increase his strength and stamina; breathe better; and be able to play with his grandchildren. We will continue to monitor his progress as he works towards meeting these goals. Patient has completed 17 sessions. His current weight is 256.4 lbs losing 2.6 lbs since last 30 day review. He is doing well in the program with consistent attendance and progressions. His blood pressure continues to be at goal.  His personal goals for the program are to increase his strength  and stamina; breathe better; and be able to play with his grandchildren. We will continue to monitor his progress as he works towards meeting these goals. Patient has completed 28 sessions. His current weight is 247.2 lbs losing 9.2 lbs since last 30 day review. He continues to do well in the program with consistent attendance and progressions. His blood pressure continues to be at goal.  His personal goals for the program continue to be to increase his strength and stamina; breathe better; and be able to play with his grandchildren. We will continue to monitor his progress as he works towards meeting these goals.     Expected Outcomes Patient will complete the program meeting both personal and program goals. Patient will complete the program meeting both personal and program goals. Patient  will complete the program meeting both personal and program goals.              Core Components/Risk Factors/Patient Goals at Discharge (Final Review):   Goals and Risk Factor Review - 12/09/22 1225       Core Components/Risk Factors/Patient Goals Review   Personal Goals Review Weight Management/Obesity;Hypertension;Lipids;Improve shortness of breath with ADL's;Other    Review Patient has completed 28 sessions. His current weight is 247.2 lbs losing 9.2 lbs since last 30 day review. He continues to do well in the program with consistent attendance and progressions. His blood pressure continues to be at goal.  His personal goals for the program continue to be to increase his strength and stamina; breathe better; and be able to play with his grandchildren. We will continue to monitor his progress as he works towards meeting these goals.    Expected Outcomes Patient will complete the program meeting both personal and program goals.             ITP Comments:   Comments: ITP REVIEW Pt is making expected progress toward Cardiac Rehab goals after completing 32 sessions. Recommend continued exercise, life style modification, education, and increased stamina and strength.

## 2022-12-20 ENCOUNTER — Encounter (HOSPITAL_COMMUNITY)
Admission: RE | Admit: 2022-12-20 | Discharge: 2022-12-20 | Disposition: A | Discharge status: Home / Self Care | Payer: Medicare Other | Source: Ambulatory Visit | Attending: Cardiology | Admitting: Cardiology

## 2022-12-20 DIAGNOSIS — Z955 Presence of coronary angioplasty implant and graft: Secondary | ICD-10-CM

## 2022-12-20 NOTE — Progress Notes (Signed)
Daily Session Note  Patient Details  Name: Richard Nolan. MRN: [id number] Date of Birth: October 20, 1955 Referring Provider:   Flowsheet Row CARDIAC REHAB PHASE II ORIENTATION from 10/02/2022 in [facility]  Referring Provider Dr. Clifton James       Encounter Date: 12/20/2022  Check In:  Session Check In - 12/20/22 0815       Check-In   Supervising physician immediately available to respond to emergencies [facility] MD immediately available    Physician(s) Dr. Diona Browner    Location [facility]    Staff Present Ross Ludwig, BS, Exercise Physiologist;Cniyah Sproull, RN;Daphyne Daphine Deutscher, RN, BSN    Virtual Visit No    Medication changes reported     No    Fall or balance concerns reported    No    Tobacco Cessation No Change    Warm-up and Cool-down Performed as group-led instruction    Resistance Training Performed Yes    VAD Patient? No    PAD/SET Patient? No      Pain Assessment   Currently in Pain? No/denies    Pain Score 0-No pain    Multiple Pain Sites No             Capillary Blood Glucose: No results found for this or any previous visit (from the past 24 hour(s)).    Social History   Tobacco Use  Smoking Status Never  Smokeless Tobacco Never    Goals Met:  Independence with exercise equipment Exercise tolerated well No report of concerns or symptoms today Strength training completed today  Goals Unmet:  Not Applicable  Comments: check out @ 9:15am   Dr. Dina Rich is Medical Director for [facility]

## 2022-12-23 ENCOUNTER — Encounter (HOSPITAL_COMMUNITY)
Admission: RE | Admit: 2022-12-23 | Discharge: 2022-12-23 | Disposition: A | Discharge status: Home / Self Care | Payer: Medicare Other | Source: Ambulatory Visit | Attending: Cardiology | Admitting: Cardiology

## 2022-12-23 VITALS — Ht 69.0 in | Wt 246.3 lb

## 2022-12-23 DIAGNOSIS — Z955 Presence of coronary angioplasty implant and graft: Secondary | ICD-10-CM

## 2022-12-23 NOTE — Progress Notes (Signed)
Daily Session Note  Patient Details  Name: Richard Nolan. MRN: [id number] Date of Birth: June 04, 1956 Referring Provider:   Flowsheet Row CARDIAC REHAB PHASE II ORIENTATION from 10/02/2022 in [facility] C[facility]AC REHABIL[facility]  Referring Provider Dr. Clifton James       Encounter Date: 12/23/2022  Check In:  Session Check In - 12/23/22 0815       Check-In   Supervising physician immediately available to respond to emergencies [facility] MD immediately available    Physician(s) Dr. Jenene Slicker    Location [facility]    Staff Present Ross Ludwig, BS, Exercise Physiologist;Novalie Leamy, RN;Daphyne Daphine Deutscher, RN, BSN    Virtual Visit No    Medication changes reported     No    Fall or balance concerns reported    No    Tobacco Cessation No Change    Warm-up and Cool-down Performed as group-led instruction    Resistance Training Performed Yes    VAD Patient? No    PAD/SET Patient? No      Pain Assessment   Currently in Pain? No/denies    Pain Score 0-No pain    Multiple Pain Sites No             Capillary Blood Glucose: No results found for this or any previous visit (from the past 24 hour(s)).    Social History   Tobacco Use  Smoking Status Never  Smokeless Tobacco Never    Goals Met:  Independence with exercise equipment Exercise tolerated well No report of concerns or symptoms today Strength training completed today  Goals Unmet:  Not Applicable  Comments: check out @ 9:15am   Dr. Dina Rich is Medical Director for [facility] Cardiac Rehab

## 2022-12-25 ENCOUNTER — Ambulatory Visit: Payer: Medicare Other | Attending: Cardiology | Admitting: Cardiology

## 2022-12-25 ENCOUNTER — Encounter (HOSPITAL_COMMUNITY): Payer: Medicare Other

## 2022-12-25 ENCOUNTER — Encounter: Payer: Self-pay | Admitting: Cardiology

## 2022-12-25 VITALS — BP 101/64 | HR 72 | Ht 69.0 in | Wt 247.0 lb

## 2022-12-25 DIAGNOSIS — I2510 Atherosclerotic heart disease of native coronary artery without angina pectoris: Secondary | ICD-10-CM

## 2022-12-25 DIAGNOSIS — I10 Essential (primary) hypertension: Secondary | ICD-10-CM | POA: Diagnosis present

## 2022-12-25 DIAGNOSIS — E785 Hyperlipidemia, unspecified: Secondary | ICD-10-CM | POA: Diagnosis present

## 2022-12-25 DIAGNOSIS — I77819 Aortic ectasia, unspecified site: Secondary | ICD-10-CM

## 2022-12-25 DIAGNOSIS — G4733 Obstructive sleep apnea (adult) (pediatric): Secondary | ICD-10-CM

## 2022-12-25 MED ORDER — METOPROLOL SUCCINATE ER 25 MG PO TB24: 12.5000 mg | ORAL_TABLET | Freq: Every day | ORAL | 3 refills | Status: DC

## 2022-12-25 NOTE — Progress Notes (Signed)
Cardiology Office Note:    Date:  12/27/2022   ID:  Richard Nolan., DOB [birth year], MRN [id number]  PCP:  Carylon Perches, MD  Cardiologist:  Thomasene Ripple, DO  Electrophysiologist:  None   Referring MD: Carylon Perches, MD   " I am doing well"  History of Present Illness:    Richard Nolan. is a 67 y.o. male with a hx of CAD (PTCA/DES x 1 first Diagonal branch 08/29/22), hypertension, HLD, prediabetes, aortic dilatation (38mm 08/08/21), OSA, obesity.   Since his heart catheterization he has been doing well.  He completed cardiac rehab.  He tells me he is even doing more than he had done previously.  He is very happy about this.  He reports that he has had some constipation.  Blood pressure has also been on the lower end.  No other complaints at this time.  Past Medical History:  Diagnosis Date   Aortic dilatation (HCC) 08/2022   38mm   CAD (coronary artery disease) 08/29/2022   DES 1st dx   Hyperlipidemia    Hypertension    OSA (obstructive sleep apnea)    CPAP nightly   Stenosing tenosynovitis of finger of left hand    left index and left middle fingers    Past Surgical History:  Procedure Laterality Date   COLONOSCOPY N/A 06/27/2015   Procedure: COLONOSCOPY;  Surgeon: Malissa Hippo, MD;  Location: [facility];  Service: Endoscopy;  Laterality: N/A;  1030   CORONARY STENT INTERVENTION N/A 08/29/2022   Procedure: CORONARY STENT INTERVENTION;  Surgeon: Kathleene Hazel, MD;  Location: [facility];  Service: Cardiovascular;  Laterality: N/A;   HERNIA REPAIR     LEFT HEART CATH AND CORONARY ANGIOGRAPHY N/A 08/29/2022   Procedure: LEFT HEART CATH AND CORONARY ANGIOGRAPHY;  Surgeon: Kathleene Hazel, MD;  Location: [facility];  Service: Cardiovascular;  Laterality: N/A;   TRIGGER FINGER RELEASE Left 05/09/2015   Procedure: RELEASE TRIGGER FINGER/A-1 PULLEY, left index finger and left middle finger;  Surgeon: Cindee Salt, MD;  Location: [facility];  Service: Orthopedics;  Laterality: Left;   UMBILICAL HERNIA REPAIR      Current Medications: Current Meds  Medication Sig   allopurinol (ZYLOPRIM) 100 MG tablet Take 100 mg by mouth in the morning.   aspirin EC 81 MG tablet Take 81 mg by mouth in the morning.   calcium carbonate (TUMS - DOSED IN MG ELEMENTAL CALCIUM) 500 MG chewable tablet Chew 1 tablet by mouth daily as needed for indigestion or heartburn.   clopidogrel (PLAVIX) 75 MG tablet Take 1 tablet (75 mg total) by mouth daily.   magnesium oxide (MAG-OX) 400 (240 Mg) MG tablet Take 400 mg by mouth at bedtime.   metoprolol succinate (TOPROL XL) 25 MG 24 hr tablet Take 0.5 tablets (12.5 mg total) by mouth daily.   nitroGLYCERIN (NITROSTAT) 0.4 MG SL tablet Place 1 tablet (0.4 mg total) under the tongue every 5 (five) minutes as needed for chest pain.   rosuvastatin (CRESTOR) 20 MG tablet Take 20 mg by mouth at bedtime.   [DISCONTINUED] metoprolol succinate (TOPROL XL) 25 MG 24 hr tablet Take 1 tablet (25 mg total) by mouth daily.     Allergies:   Vioxx [rofecoxib]   Social History   Socioeconomic History   Marital status: Married    Spouse name: Not on file   Number of children: Y   Years of education: Not on file  Highest education level: Not on file  Occupational History   Occupation: truck driver  Tobacco Use   Smoking status: Never   Smokeless tobacco: Never  Vaping Use   Vaping Use: Never used  Substance and Sexual Activity   Alcohol use: No    Alcohol/week: 0.0 standard drinks of alcohol   Drug use: No   Sexual activity: Never  Other Topics Concern   Not on file  Social History Narrative   Not on file   Social Determinants of Health   Financial Resource Strain: Not on file  Food Insecurity: Not on file  Transportation Needs: Not on file  Physical Activity: Not on file  Stress: Not on file  Social Connections: Not on file     Family History: The patient's family history includes Heart  disease in his father.  ROS:   Review of Systems  Constitution: Negative for decreased appetite, fever and weight gain.  HENT: Negative for congestion, ear discharge, hoarse voice and sore throat.   Eyes: Negative for discharge, redness, vision loss in right eye and visual halos.  Cardiovascular: Negative for chest pain, dyspnea on exertion, leg swelling, orthopnea and palpitations.  Respiratory: Negative for cough, hemoptysis, shortness of breath and snoring.   Endocrine: Negative for heat intolerance and polyphagia.  Hematologic/Lymphatic: Negative for bleeding problem. Does not bruise/bleed easily.  Skin: Negative for flushing, nail changes, rash and suspicious lesions.  Musculoskeletal: Negative for arthritis, joint pain, muscle cramps, myalgias, neck pain and stiffness.  Gastrointestinal: Negative for abdominal pain, bowel incontinence, diarrhea and excessive appetite.  Genitourinary: Negative for decreased libido, genital sores and incomplete emptying.  Neurological: Negative for brief paralysis, focal weakness, headaches and loss of balance.  Psychiatric/Behavioral: Negative for altered mental status, depression and suicidal ideas.  Allergic/Immunologic: Negative for HIV exposure and persistent infections.    EKGs/Labs/Other Studies Reviewed:    The following studies were reviewed today:   EKG:  The ekg ordered today demonstrates   Recent Labs: 08/27/2022: Hemoglobin 16.8; Magnesium 1.9; Platelets 225 09/09/2022: BUN 14; Creatinine, Ser 1.42; Potassium 4.5; Sodium 140  Recent Lipid Panel    Component Value Date/Time   CHOL 101 08/15/2022 0846   TRIG 87 08/15/2022 0846   HDL 42 08/15/2022 0846   CHOLHDL 2.4 08/15/2022 0846   LDLCALC 42 08/15/2022 0846    Physical Exam:    VS:  BP 101/64 (BP Location: Left Arm, Patient Position: Sitting, Cuff Size: Large)   Pulse 72   Ht 5\' 9"  (1.753 m)   Wt 247 lb (112 kg)   SpO2 96%   BMI 36.48 kg/m     Wt Readings from Last 3  Encounters:  12/25/22 247 lb (112 kg)  12/23/22 246 lb 4.1 oz (111.7 kg)  12/16/22 245 lb 9.5 oz (111.4 kg)     GEN: Well nourished, well developed in no acute distress HEENT: Normal NECK: No JVD; No carotid bruits LYMPHATICS: No lymphadenopathy CARDIAC: S1S2 noted,RRR, no murmurs, rubs, gallops RESPIRATORY:  Clear to auscultation without rales, wheezing or rhonchi  ABDOMEN: Soft, non-tender, non-distended, +bowel sounds, no guarding. EXTREMITIES: No edema, No cyanosis, no clubbing MUSCULOSKELETAL:  No deformity  SKIN: Warm and dry NEUROLOGIC:  Alert and oriented x 3, non-focal PSYCHIATRIC:  Normal affect, good insight  ASSESSMENT:    1. Coronary artery disease involving native coronary artery of native heart without angina pectoris   2. Hypertension, unspecified type   3. Hyperlipidemia, unspecified hyperlipidemia type   4. Morbid obesity (HCC)   5.  Aortic dilatation (HCC)   6. OSA (obstructive sleep apnea)    PLAN:    Cut back on the Toprol-XL to 12.5 mg daily.  Hopefully this will help. Continue his dual antiplatelet therapy will complete a year which will be February 2025. Continue current statin dose DL goal less than 55.  In February this was 42.  The patient is in agreement with the above plan. The patient left the office in stable condition.  The patient will follow up in   Medication Adjustments/Labs and Tests Ordered: Current medicines are reviewed at length with the patient today.  Concerns regarding medicines are outlined above.  No orders of the defined types were placed in this encounter.  Meds ordered this encounter  Medications   metoprolol succinate (TOPROL XL) 25 MG 24 hr tablet    Sig: Take 0.5 tablets (12.5 mg total) by mouth daily.    Dispense:  45 tablet    Refill:  3    Patient Instructions  Medication Instructions:  Your physician has recommended you make the following change in your medication:  DECREASE: Metoprolol Succinate (Toprol-XL)  12.5 mg once daily  *If you need a refill on your cardiac medications before your next appointment, please call your pharmacy*   Lab Work: None   Testing/Procedures: None   Follow-Up: At [facility], you and your health needs are our priority.  As part of our continuing mission to provide you with exceptional heart care, we have created designated Provider Care Teams.  These Care Teams include your primary Cardiologist (physician) and Advanced Practice Providers (APPs -  Physician Assistants and Nurse Practitioners) who all work together to provide you with the care you need, when you need it.   Your next appointment:   6 month(s)  Provider:   Thomasene Ripple, DO    Adopting a Healthy Lifestyle.  Know what a healthy weight is for you (roughly BMI <25) and aim to maintain this   Aim for 7+ servings of fruits and vegetables daily   65-80+ fluid ounces of water or unsweet tea for healthy kidneys   Limit to max 1 drink of alcohol per day; avoid smoking/tobacco   Limit animal fats in diet for cholesterol and heart health - choose grass fed whenever available   Avoid highly processed foods, and foods high in saturated/trans fats   Aim for low stress - take time to unwind and care for your mental health   Aim for 150 min of moderate intensity exercise weekly for heart health, and weights twice weekly for bone health   Aim for 7-9 hours of sleep daily   When it comes to diets, agreement about the perfect plan isnt easy to find, even among the experts. Experts at the [facility] [employer] developed an idea known as the Healthy Eating Plate. Just imagine a plate divided into logical, healthy portions.   The emphasis is on diet quality:   Load up on vegetables and fruits - one-half of your plate: Aim for color and variety, and remember that potatoes dont count.   Go for whole grains - one-quarter of your plate: Whole wheat, barley, wheat berries, quinoa, oats,  brown rice, and foods made with them. If you want pasta, go with whole wheat pasta.   Protein power - one-quarter of your plate: Fish, chicken, beans, and nuts are all healthy, versatile protein sources. Limit red meat.   The diet, however, does go beyond the plate, offering a few other  suggestions.   Use healthy plant oils, such as olive, canola, soy, corn, sunflower and peanut. Check the labels, and avoid partially hydrogenated oil, which have unhealthy trans fats.   If youre thirsty, drink water. Coffee and tea are good in moderation, but skip sugary drinks and limit milk and dairy products to one or two daily servings.   The type of carbohydrate in the diet is more important than the amount. Some sources of carbohydrates, such as vegetables, fruits, whole grains, and beans-are healthier than others.   Finally, stay active  Signed, Thomasene Ripple, DO  12/27/2022 8:02 PM    [facility]

## 2022-12-25 NOTE — Patient Instructions (Signed)
Medication Instructions:  Your physician has recommended you make the following change in your medication:  DECREASE: Metoprolol Succinate (Toprol-XL) 12.5 mg once daily  *If you need a refill on your cardiac medications before your next appointment, please call your pharmacy*   Lab Work: None   Testing/Procedures: None   Follow-Up: At [facility], you and your health needs are our priority.  As part of our continuing mission to provide you with exceptional heart care, we have created designated Provider Care Teams.  These Care Teams include your primary Cardiologist (physician) and Advanced Practice Providers (APPs -  Physician Assistants and Nurse Practitioners) who all work together to provide you with the care you need, when you need it.   Your next appointment:   6 month(s)  Provider:   Thomasene Ripple, DO

## 2022-12-26 NOTE — Progress Notes (Signed)
Discharge Progress Report  Patient Details  Name: Richard Nolan. MRN: [id number] Date of Birth: Feb 14, 1956 Referring Provider:   Flowsheet Row CARDIAC REHAB PHASE II ORIENTATION from 10/02/2022 in [facility] CARDIAC REHABILITATION  Referring Provider Dr. Clifton James        Number of Visits: 35  Reason for Discharge:  Patient reached a stable level of exercise. Patient independent in their exercise. Patient has met program and personal goals.  Smoking History:  Social History   Tobacco Use  Smoking Status Never  Smokeless Tobacco Never    Diagnosis:  Status post coronary artery stent placement  ADL UCSD:   Initial Exercise Prescription:  Initial Exercise Prescription - 10/02/22 1400       Date of Initial Exercise RX and Referring Provider   Date 10/02/22    Referring Provider Dr. Clifton James    Expected Discharge Date 12/25/22      Treadmill   MPH 2    Grade 0    Minutes 17      Recumbant Elliptical   Level 1    RPM 45    Minutes 22      Prescription Details   Frequency (times per week) 3    Duration Progress to 30 minutes of continuous aerobic without signs/symptoms of physical distress      Intensity   THRR 40-80% of Max Heartrate 61-122    Ratings of Perceived Exertion 11-13      Resistance Training   Training Prescription Yes    Weight 3    Reps 10-15             Discharge Exercise Prescription (Final Exercise Prescription Changes):  Exercise Prescription Changes - 12/16/22 1100       Response to Exercise   Blood Pressure (Admit) 108/80    Blood Pressure (Exercise) 140/66    Blood Pressure (Exit) 120/62    Heart Rate (Admit) 68 bpm    Heart Rate (Exercise) 97 bpm    Heart Rate (Exit) 77 bpm    Rating of Perceived Exertion (Exercise) 11    Duration Continue with 30 min of aerobic exercise without signs/symptoms of physical distress.    Intensity THRR unchanged      Progression   Progression Continue to progress workloads to maintain  intensity without signs/symptoms of physical distress.      Resistance Training   Training Prescription Yes    Weight 8    Reps 10-15    Time 10 Minutes      Treadmill   MPH 2.4    Grade 3    Minutes 17    METs 3.82      Recumbant Elliptical   Level 5    RPM 55    Minutes 22    METs 4.9             Functional Capacity:  6 Minute Walk     Row Name 10/02/22 1404 12/23/22 1034       6 Minute Walk   Phase Initial Discharge    Distance 1500 feet 1550 feet    Walk Time 6 minutes 6 minutes    # of Rest Breaks 0 0    MPH 2.84 2.93    METS 2.62 2.84    RPE 11 11    VO2 Peak 9.18 9.95    Symptoms No No    Resting HR 65 bpm 70 bpm    Resting BP 126/80 116/60    Resting Oxygen Saturation  96 % 96 %    Exercise Oxygen Saturation  during 6 min walk 94 % 96 %    Max Ex. HR 82 bpm 85 bpm    Max Ex. BP 140/80 140/64    2 Minute Post BP 120/70 122/64             Psychological, QOL, Others - Outcomes: PHQ 2/9:    12/26/2022   12:38 PM 10/02/2022    1:09 PM 05/09/2018   12:33 PM 10/06/2017    5:25 PM 07/23/2017   11:11 AM  Depression screen PHQ 2/9  Decreased Interest 0 0 0 0 0  Down, Depressed, Hopeless  0 0 0 0  PHQ - 2 Score 0 0 0 0 0  Altered sleeping 0 0     Tired, decreased energy 0 0     Change in appetite 0 0     Feeling bad or failure about yourself  0 0     Trouble concentrating 0 0     Moving slowly or fidgety/restless 0 0     Suicidal thoughts 0 0     PHQ-9 Score 0 0     Difficult doing work/chores  Not difficult at all       Quality of Life:  Quality of Life - 12/23/22 1036       Quality of Life   Select Quality of Life      Quality of Life Scores   Health/Function Pre 28.4 %    Health/Function Post 29.2 %    Health/Function % Change 2.82 %    Socioeconomic Pre 30 %    Socioeconomic Post 29.29 %    Socioeconomic % Change  -2.37 %    Psych/Spiritual Pre 28.29 %    Psych/Spiritual Post 30 %    Psych/Spiritual % Change 6.04 %    Family  Pre 30 %    Family Post 30 %    Family % Change 0 %    GLOBAL Pre 28.94 %    GLOBAL Post 29.5 %    GLOBAL % Change 1.94 %             Personal Goals: Goals established at orientation with interventions provided to work toward goal.  Personal Goals and Risk Factors at Admission - 10/02/22 1320       Core Components/Risk Factors/Patient Goals on Admission    Weight Management Yes;Weight Loss    Intervention Obesity: Provide education and appropriate resources to help participant work on and attain dietary goals.;Weight Management/Obesity: Establish reasonable short term and long term weight goals.;Weight Management: Provide education and appropriate resources to help participant work on and attain dietary goals.;Weight Management: Develop a combined nutrition and exercise program designed to reach desired caloric intake, while maintaining appropriate intake of nutrient and fiber, sodium and fats, and appropriate energy expenditure required for the weight goal.    Expected Outcomes Short Term: Continue to assess and modify interventions until short term weight is achieved;Long Term: Adherence to nutrition and physical activity/exercise program aimed toward attainment of established weight goal;Weight Maintenance: Understanding of the daily nutrition guidelines, which includes 25-35% calories from fat, 7% or less cal from saturated fats, less than 200mg  cholesterol, less than 1.5gm of sodium, & 5 or more servings of fruits and vegetables daily;Weight Loss: Understanding of general recommendations for a balanced deficit meal plan, which promotes 1-2 lb weight loss per week and includes a negative energy balance of [phone] kcal/d;Understanding recommendations for meals to include 15-35%  energy as protein, 25-35% energy from fat, 35-60% energy from carbohydrates, less than 200mg  of dietary cholesterol, 20-35 gm of total fiber daily;Understanding of distribution of calorie intake throughout the day  with the consumption of 4-5 meals/snacks    Improve shortness of breath with ADL's Yes    Intervention Provide education, individualized exercise plan and daily activity instruction to help decrease symptoms of SOB with activities of daily living.    Expected Outcomes Short Term: Improve cardiorespiratory fitness to achieve a reduction of symptoms when performing ADLs;Long Term: Be able to perform more ADLs without symptoms or delay the onset of symptoms    Hypertension Yes    Intervention Provide education on lifestyle modifcations including regular physical activity/exercise, weight management, moderate sodium restriction and increased consumption of fresh fruit, vegetables, and low fat dairy, alcohol moderation, and smoking cessation.;Monitor prescription use compliance.    Expected Outcomes Short Term: Continued assessment and intervention until BP is < 140/50mm HG in hypertensive participants. < 130/79mm HG in hypertensive participants with diabetes, heart failure or chronic kidney disease.;Long Term: Maintenance of blood pressure at goal levels.    Lipids Yes    Intervention Provide education and support for participant on nutrition & aerobic/resistive exercise along with prescribed medications to achieve LDL 70mg , HDL >40mg .    Expected Outcomes Short Term: Participant states understanding of desired cholesterol values and is compliant with medications prescribed. Participant is following exercise prescription and nutrition guidelines.;Long Term: Cholesterol controlled with medications as prescribed, with individualized exercise RX and with personalized nutrition plan. Value goals: LDL < 70mg , HDL > 40 mg.    Personal Goal Other Yes    Personal Goal Pt wants to be able to breathe better and increase his strength/stamina.    Intervention Pt will attend CR 3 times a week and continue his home exercise program.    Expected Outcomes Pt will meet both the program and his personal goals.               Personal Goals Discharge:  Goals and Risk Factor Review     Row Name 10/14/22 1514 11/13/22 0806 12/09/22 1225 12/26/22 1245       Core Components/Risk Factors/Patient Goals Review   Personal Goals Review Weight Management/Obesity;Hypertension;Lipids;Improve shortness of breath with ADL's;Other Weight Management/Obesity;Hypertension;Lipids;Improve shortness of breath with ADL's;Other Weight Management/Obesity;Hypertension;Lipids;Improve shortness of breath with ADL's;Other Weight Management/Obesity;Hypertension;Lipids;Improve shortness of breath with ADL's;Other    Review Patient was referred to CR with stent placement. He has multiple risk factors for CAD and is participating in the program for risk modification. He has completed 5 sessions. His current weight is 259 lbs maintained since his orientation visit. His blood pressure is at goal. His is doing well in the program with consistent attendance and progressions. His personal goals for the program are to increase his strength and stamina; breathe better; and be able to play with his grandchildren. We will continue to monitor his progress as he works towards meeting these goals. Patient has completed 17 sessions. His current weight is 256.4 lbs losing 2.6 lbs since last 30 day review. He is doing well in the program with consistent attendance and progressions. His blood pressure continues to be at goal.  His personal goals for the program are to increase his strength and stamina; breathe better; and be able to play with his grandchildren. We will continue to monitor his progress as he works towards meeting these goals. Patient has completed 28 sessions. His current weight is 247.2 lbs losing 9.2  lbs since last 30 day review. He continues to do well in the program with consistent attendance and progressions. His blood pressure continues to be at goal.  His personal goals for the program continue to be to increase his strength and stamina; breathe  better; and be able to play with his grandchildren. We will continue to monitor his progress as he works towards meeting these goals. Patient graduated from the program with 35 sessions. He lost 11.2 lbs overall in the program and did very well. His exit walk test improved by 3.3%. His blood pressure was at goal. He says he feels a lot better than when he started and he did meet his personal goals. He also met most program outcome goals. He plans to continue exercising by walking and using his stationary bike at home.    Expected Outcomes Patient will complete the program meeting both personal and program goals. Patient will complete the program meeting both personal and program goals. Patient will complete the program meeting both personal and program goals. Patient will continue to exercsie by walking and using a stationary bike at home.             Exercise Goals and Review:  Exercise Goals     Row Name 10/02/22 1408 10/21/22 1323 11/18/22 1152 12/16/22 1158       Exercise Goals   Increase Physical Activity Yes Yes Yes Yes    Intervention Provide advice, education, support and counseling about physical activity/exercise needs.;Develop an individualized exercise prescription for aerobic and resistive training based on initial evaluation findings, risk stratification, comorbidities and participant's personal goals. Provide advice, education, support and counseling about physical activity/exercise needs.;Develop an individualized exercise prescription for aerobic and resistive training based on initial evaluation findings, risk stratification, comorbidities and participant's personal goals. Provide advice, education, support and counseling about physical activity/exercise needs.;Develop an individualized exercise prescription for aerobic and resistive training based on initial evaluation findings, risk stratification, comorbidities and participant's personal goals. Provide advice, education, support  and counseling about physical activity/exercise needs.;Develop an individualized exercise prescription for aerobic and resistive training based on initial evaluation findings, risk stratification, comorbidities and participant's personal goals.    Expected Outcomes Short Term: Attend rehab on a regular basis to increase amount of physical activity.;Long Term: Add in home exercise to make exercise part of routine and to increase amount of physical activity.;Long Term: Exercising regularly at least 3-5 days a week. Short Term: Attend rehab on a regular basis to increase amount of physical activity.;Long Term: Add in home exercise to make exercise part of routine and to increase amount of physical activity.;Long Term: Exercising regularly at least 3-5 days a week. Short Term: Attend rehab on a regular basis to increase amount of physical activity.;Long Term: Add in home exercise to make exercise part of routine and to increase amount of physical activity.;Long Term: Exercising regularly at least 3-5 days a week. Short Term: Attend rehab on a regular basis to increase amount of physical activity.;Long Term: Add in home exercise to make exercise part of routine and to increase amount of physical activity.;Long Term: Exercising regularly at least 3-5 days a week.    Increase Strength and Stamina Yes Yes Yes Yes    Intervention Provide advice, education, support and counseling about physical activity/exercise needs.;Develop an individualized exercise prescription for aerobic and resistive training based on initial evaluation findings, risk stratification, comorbidities and participant's personal goals. Provide advice, education, support and counseling about physical activity/exercise needs.;Develop an individualized exercise prescription  for aerobic and resistive training based on initial evaluation findings, risk stratification, comorbidities and participant's personal goals. Provide advice, education, support and  counseling about physical activity/exercise needs.;Develop an individualized exercise prescription for aerobic and resistive training based on initial evaluation findings, risk stratification, comorbidities and participant's personal goals. Provide advice, education, support and counseling about physical activity/exercise needs.;Develop an individualized exercise prescription for aerobic and resistive training based on initial evaluation findings, risk stratification, comorbidities and participant's personal goals.    Expected Outcomes Short Term: Increase workloads from initial exercise prescription for resistance, speed, and METs.;Short Term: Perform resistance training exercises routinely during rehab and add in resistance training at home;Long Term: Improve cardiorespiratory fitness, muscular endurance and strength as measured by increased METs and functional capacity ( ) Short Term: Increase workloads from initial exercise prescription for resistance, speed, and METs.;Short Term: Perform resistance training exercises routinely during rehab and add in resistance training at home;Long Term: Improve cardiorespiratory fitness, muscular endurance and strength as measured by increased METs and functional capacity ( ) Short Term: Increase workloads from initial exercise prescription for resistance, speed, and METs.;Short Term: Perform resistance training exercises routinely during rehab and add in resistance training at home;Long Term: Improve cardiorespiratory fitness, muscular endurance and strength as measured by increased METs and functional capacity ( ) Short Term: Increase workloads from initial exercise prescription for resistance, speed, and METs.;Short Term: Perform resistance training exercises routinely during rehab and add in resistance training at home;Long Term: Improve cardiorespiratory fitness, muscular endurance and strength as measured by increased METs and functional capacity ( )     Able to understand and use rate of perceived exertion (RPE) scale Yes Yes Yes Yes    Intervention Provide education and explanation on how to use RPE scale Provide education and explanation on how to use RPE scale Provide education and explanation on how to use RPE scale Provide education and explanation on how to use RPE scale    Expected Outcomes Short Term: Able to use RPE daily in rehab to express subjective intensity level;Long Term:  Able to use RPE to guide intensity level when exercising independently Short Term: Able to use RPE daily in rehab to express subjective intensity level;Long Term:  Able to use RPE to guide intensity level when exercising independently Short Term: Able to use RPE daily in rehab to express subjective intensity level;Long Term:  Able to use RPE to guide intensity level when exercising independently Short Term: Able to use RPE daily in rehab to express subjective intensity level;Long Term:  Able to use RPE to guide intensity level when exercising independently    Knowledge and understanding of Target Heart Rate Range (THRR) Yes Yes Yes Yes    Intervention Provide education and explanation of THRR including how the numbers were predicted and where they are located for reference Provide education and explanation of THRR including how the numbers were predicted and where they are located for reference Provide education and explanation of THRR including how the numbers were predicted and where they are located for reference Provide education and explanation of THRR including how the numbers were predicted and where they are located for reference    Expected Outcomes Short Term: Able to state/look up THRR;Long Term: Able to use THRR to govern intensity when exercising independently;Short Term: Able to use daily as guideline for intensity in rehab Short Term: Able to state/look up THRR;Long Term: Able to use THRR to govern intensity when exercising independently;Short Term: Able to use  daily as guideline for intensity in  rehab Short Term: Able to state/look up THRR;Long Term: Able to use THRR to govern intensity when exercising independently;Short Term: Able to use daily as guideline for intensity in rehab Short Term: Able to state/look up THRR;Long Term: Able to use THRR to govern intensity when exercising independently;Short Term: Able to use daily as guideline for intensity in rehab    Able to check pulse independently Yes Yes Yes Yes    Intervention Provide education and demonstration on how to check pulse in carotid and radial arteries.;Review the importance of being able to check your own pulse for safety during independent exercise Provide education and demonstration on how to check pulse in carotid and radial arteries.;Review the importance of being able to check your own pulse for safety during independent exercise Provide education and demonstration on how to check pulse in carotid and radial arteries.;Review the importance of being able to check your own pulse for safety during independent exercise Provide education and demonstration on how to check pulse in carotid and radial arteries.;Review the importance of being able to check your own pulse for safety during independent exercise    Expected Outcomes Long Term: Able to check pulse independently and accurately;Short Term: Able to explain why pulse checking is important during independent exercise Long Term: Able to check pulse independently and accurately;Short Term: Able to explain why pulse checking is important during independent exercise Long Term: Able to check pulse independently and accurately;Short Term: Able to explain why pulse checking is important during independent exercise Long Term: Able to check pulse independently and accurately;Short Term: Able to explain why pulse checking is important during independent exercise    Understanding of Exercise Prescription Yes Yes Yes Yes    Intervention Provide education,  explanation, and written materials on patient's individual exercise prescription Provide education, explanation, and written materials on patient's individual exercise prescription Provide education, explanation, and written materials on patient's individual exercise prescription Provide education, explanation, and written materials on patient's individual exercise prescription    Expected Outcomes Short Term: Able to explain program exercise prescription;Long Term: Able to explain home exercise prescription to exercise independently Short Term: Able to explain program exercise prescription;Long Term: Able to explain home exercise prescription to exercise independently Short Term: Able to explain program exercise prescription;Long Term: Able to explain home exercise prescription to exercise independently Short Term: Able to explain program exercise prescription;Long Term: Able to explain home exercise prescription to exercise independently             Exercise Goals Re-Evaluation:  Exercise Goals Re-Evaluation     Row Name 10/21/22 1324 11/18/22 1153 12/16/22 1159         Exercise Goal Re-Evaluation   Exercise Goals Review Increase Physical Activity;Increase Strength and Stamina;Able to understand and use rate of perceived exertion (RPE) scale;Knowledge and understanding of Target Heart Rate Range (THRR);Able to check pulse independently;Understanding of Exercise Prescription Increase Physical Activity;Increase Strength and Stamina;Able to understand and use rate of perceived exertion (RPE) scale;Knowledge and understanding of Target Heart Rate Range (THRR);Able to check pulse independently;Understanding of Exercise Prescription Increase Physical Activity;Increase Strength and Stamina;Able to understand and use rate of perceived exertion (RPE) scale;Knowledge and understanding of Target Heart Rate Range (THRR);Understanding of Exercise Prescription;Able to check pulse independently     Comments Pt  has completed 9 sessions of cardiac rehab. He is tolerating exercise well and has started to increase his workload. He is currently exercising at 5.3 METs on the ellp. Will continue to monitor and progress as  able. Pt has completed 21 sessions of cardiac rehab. He continues to do well in the program and increasing his workloads. He sometimes uses just his arms on the ellp and has been asked if he would like to use the arm ergometer instead of the ellp but he does not. He is currently exercising at 5.9 METs on the ellp. Will continue to monitor and progress as able, Pt has completed 32 sessions of caridac rehab. He continues to progress in the program and is about to finish up his classes. He has improved his workloads over each class. He started to increase his grade on the treadmill to a 7 and started to get cramps in his calfs. We informed him that th cramps are due to walking at a high grade and to focuse more on the speed of walking then the grade while on the treadmill. He is currently exercising at 4.9 METs on the ellp. Will continue to monitor and progres as able.     Expected Outcomes Throught exercise at rehab and home, the patients will meet their stated goals. Throught exercise at rehab and home, the patients will meet their stated goals. Throught exercise at rehab and home, the patients will meet their stated goals.              Nutrition & Weight - Outcomes:  Pre Biometrics - 10/02/22 1409       Pre Biometrics   Height 5\' 9"  (1.753 m)    Weight 116.8 kg    Waist Circumference 52 inches    Hip Circumference 49 inches    Waist to Hip Ratio 1.06 %    BMI (Calculated) 38.01    Triceps Skinfold 15 mm    % Body Fat 37 %    Grip Strength 25.7 kg    Flexibility 0 in    Single Leg Stand 20.21 seconds             Post Biometrics - 12/23/22 1036        Post  Biometrics   Height 5\' 9"  (1.753 m)    Weight 111.7 kg    Waist Circumference 48 inches    Hip Circumference 47.5 inches     Waist to Hip Ratio 1.01 %    BMI (Calculated) 36.35    Triceps Skinfold 15 mm    % Body Fat 34.5 %    Grip Strength 30.1 kg    Flexibility 0 in    Single Leg Stand 45.33 seconds             Nutrition:  Nutrition Therapy & Goals - 12/26/22 1241       Personal Nutrition Goals   Comments Patient's discharge diet score increased from 27 to 31. He did however lose 11.2 lbs in the program.             Nutrition Discharge:  Nutrition Assessments - 12/26/22 1239       MEDFICTS Scores   Post Score 31             Education Questionnaire Score:  Knowledge Questionnaire Score - 12/26/22 1242       Knowledge Questionnaire Score   Post Score 23/24            Patient graduated from the program with 35 sessions. He lost 11.2 lbs overall in the program and did very well. His exit walk test improved by 3.3%. His blood pressure was at goal. He says he feels a lot better  than when he started and he did meet his personal goals. He also met most program outcome goals. He plans to continue exercising by walking and using his stationary bike at home.  Goals reviewed with patient; copy given to patient.

## 2023-03-19 ENCOUNTER — Other Ambulatory Visit: Payer: Self-pay | Admitting: Cardiology

## 2023-06-16 ENCOUNTER — Encounter: Payer: Self-pay | Admitting: Cardiology

## 2023-06-16 ENCOUNTER — Ambulatory Visit: Payer: Medicare Other | Attending: Cardiology | Admitting: Cardiology

## 2023-06-16 VITALS — BP 134/80 | HR 72 | Ht 69.0 in | Wt 258.4 lb

## 2023-06-16 DIAGNOSIS — I77819 Aortic ectasia, unspecified site: Secondary | ICD-10-CM | POA: Insufficient documentation

## 2023-06-16 DIAGNOSIS — E785 Hyperlipidemia, unspecified: Secondary | ICD-10-CM | POA: Diagnosis present

## 2023-06-16 DIAGNOSIS — I2510 Atherosclerotic heart disease of native coronary artery without angina pectoris: Secondary | ICD-10-CM | POA: Diagnosis present

## 2023-06-16 NOTE — Patient Instructions (Signed)
Medication Instructions:  Stop: Clopidogrel (PLAVIX) on March 1st, 2025 *If you need a refill on your cardiac medications before your next appointment, please call your pharmacy*  Lab Work: None  Follow-Up: At [facility], you and your health needs are our priority.  As part of our continuing mission to provide you with exceptional heart care, we have created designated Provider Care Teams.  These Care Teams include your primary Cardiologist (physician) and Advanced Practice Providers (APPs -  Physician Assistants and Nurse Practitioners) who all work together to provide you with the care you need, when you need it.  Your next appointment:   9 month(s)  Provider:   Thomasene Ripple, DO

## 2023-06-16 NOTE — Progress Notes (Signed)
Cardiology Office Note:    Date:  06/16/2023   ID:  Richard Pimple., DOB 04-08-1956, MRN [id number]  PCP:  Carylon Perches, MD  Cardiologist:  Thomasene Ripple, DO  Electrophysiologist:  None   Referring MD: Carylon Perches, MD   " I am doing well"  History of Present Illness:    Richard Nolan. is a 67 y.o. male with a hx of CAD (PTCA/DES x 1 first Diagonal branch 08/29/22), hypertension, HLD, prediabetes, aortic dilatation (38mm 08/08/21), OSA, obesity.   Since his visit with me he has been doing well. He reports a 'ting' sensation, located centrally in the chest. The pain is triggered by physical exertion, such as mowing the lawn or carrying heavy objects, and is relieved by rest. The patient also reports a recent episode of chest pain that occurred in cold weather while breathing heavily, which resolved after a brief rest. The patient denies any associated symptoms such as shortness of breath or indigestion.  Past Medical History:  Diagnosis Date   Aortic dilatation (HCC) 08/2022   38mm   CAD (coronary artery disease) 08/29/2022   DES 1st dx   Hyperlipidemia    Hypertension    OSA (obstructive sleep apnea)    CPAP nightly   Stenosing tenosynovitis of finger of left hand    left index and left middle fingers    Past Surgical History:  Procedure Laterality Date   COLONOSCOPY N/A 06/27/2015   Procedure: COLONOSCOPY;  Surgeon: Malissa Hippo, MD;  [facility];  Service: Endoscopy;  Laterality: N/A;  1030   CORONARY STENT INTERVENTION N/A 08/29/2022   Procedure: CORONARY STENT INTERVENTION;  Surgeon: Kathleene Hazel, MD;  [facility];  Service: Cardiovascular;  Laterality: N/A;   HERNIA REPAIR     LEFT HEART CATH AND CORONARY ANGIOGRAPHY N/A 08/29/2022   Procedure: LEFT HEART CATH AND CORONARY ANGIOGRAPHY;  Surgeon: Kathleene Hazel, MD;  [facility];  Service: Cardiovascular;  Laterality: N/A;   TRIGGER FINGER RELEASE Left  05/09/2015   Procedure: RELEASE TRIGGER FINGER/A-1 PULLEY, left index finger and left middle finger;  Surgeon: Cindee Salt, MD;  Location: [facility];  Service: Orthopedics;  Laterality: Left;   UMBILICAL HERNIA REPAIR      Current Medications: Current Meds  Medication Sig   allopurinol (ZYLOPRIM) 100 MG tablet Take 100 mg by mouth in the morning.   aspirin EC 81 MG tablet Take 81 mg by mouth in the morning.   calcium carbonate (TUMS - DOSED IN MG ELEMENTAL CALCIUM) 500 MG chewable tablet Chew 1 tablet by mouth daily as needed for indigestion or heartburn.   clopidogrel (PLAVIX) 75 MG tablet TAKE 1 TABLET BY MOUTH EVERY DAY   magnesium oxide (MAG-OX) 400 (240 Mg) MG tablet Take 400 mg by mouth at bedtime.   metoprolol succinate (TOPROL XL) 25 MG 24 hr tablet Take 0.5 tablets (12.5 mg total) by mouth daily.   nitroGLYCERIN (NITROSTAT) 0.4 MG SL tablet Place 1 tablet (0.4 mg total) under the tongue every 5 (five) minutes as needed for chest pain.   rosuvastatin (CRESTOR) 20 MG tablet Take 20 mg by mouth at bedtime.     Allergies:   Vioxx [rofecoxib]   Social History   Socioeconomic History   Marital status: Married    Spouse name: Not on file   Number of children: Y   Years of education: Not on file   Highest education level: Not  on file  Occupational History   Occupation: truck driver  Tobacco Use   Smoking status: Never   Smokeless tobacco: Never  Vaping Use   Vaping status: Never Used  Substance and Sexual Activity   Alcohol use: No    Alcohol/week: 0.0 standard drinks of alcohol   Drug use: No   Sexual activity: Never  Other Topics Concern   Not on file  Social History Narrative   Not on file   Social Drivers of Health   Financial Resource Strain: Not on file  Food Insecurity: Not on file  Transportation Needs: Not on file  Physical Activity: Not on file  Stress: Not on file  Social Connections: Not on file     Family History: The patient's  family history includes Heart disease in his father.  ROS:   Review of Systems  Constitution: Negative for decreased appetite, fever and weight gain.  HENT: Negative for congestion, ear discharge, hoarse voice and sore throat.   Eyes: Negative for discharge, redness, vision loss in right eye and visual halos.  Cardiovascular: Negative for chest pain, dyspnea on exertion, leg swelling, orthopnea and palpitations.  Respiratory: Negative for cough, hemoptysis, shortness of breath and snoring.   Endocrine: Negative for heat intolerance and polyphagia.  Hematologic/Lymphatic: Negative for bleeding problem. Does not bruise/bleed easily.  Skin: Negative for flushing, nail changes, rash and suspicious lesions.  Musculoskeletal: Negative for arthritis, joint pain, muscle cramps, myalgias, neck pain and stiffness.  Gastrointestinal: Negative for abdominal pain, bowel incontinence, diarrhea and excessive appetite.  Genitourinary: Negative for decreased libido, genital sores and incomplete emptying.  Neurological: Negative for brief paralysis, focal weakness, headaches and loss of balance.  Psychiatric/Behavioral: Negative for altered mental status, depression and suicidal ideas.  Allergic/Immunologic: Negative for HIV exposure and persistent infections.    EKGs/Labs/Other Studies Reviewed:    The following studies were reviewed today:   EKG:  The ekg ordered today demonstrates   Recent Labs: 08/27/2022: Hemoglobin 16.8; Magnesium 1.9; Platelets 225 09/09/2022: BUN 14; Creatinine, Ser 1.42; Potassium 4.5; Sodium 140  Recent Lipid Panel    Component Value Date/Time   CHOL 101 08/15/2022 0846   TRIG 87 08/15/2022 0846   HDL 42 08/15/2022 0846   CHOLHDL 2.4 08/15/2022 0846   LDLCALC 42 08/15/2022 0846    Physical Exam:    VS:  BP 134/80 (BP Location: Left Arm, Patient Position: Sitting, Cuff Size: Normal)   Pulse 72   Ht 5\' 9"  (1.753 m)   Wt 258 lb 6.4 oz (117.2 kg)   SpO2 98%   BMI  38.16 kg/m     Wt Readings from Last 3 Encounters:  06/16/23 258 lb 6.4 oz (117.2 kg)  12/25/22 247 lb (112 kg)  12/23/22 246 lb 4.1 oz (111.7 kg)     GEN: Well nourished, well developed in no acute distress HEENT: Normal NECK: No JVD; No carotid bruits LYMPHATICS: No lymphadenopathy CARDIAC: S1S2 noted,RRR, no murmurs, rubs, gallops RESPIRATORY:  Clear to auscultation without rales, wheezing or rhonchi  ABDOMEN: Soft, non-tender, non-distended, +bowel sounds, no guarding. EXTREMITIES: No edema, No cyanosis, no clubbing MUSCULOSKELETAL:  No deformity  SKIN: Warm and dry NEUROLOGIC:  Alert and oriented x 3, non-focal PSYCHIATRIC:  Normal affect, good insight  ASSESSMENT:    1. CAD status post PCI   2. Aortic dilatation (HCC)   3. Morbid obesity (HCC)   4. Hyperlipidemia, unspecified hyperlipidemia type    PLAN:     Patient on Plavix and aspirin for  stent placed last year. No current chest pain, but reported brief chest discomfort with exertion in cold weather. -Continue Plavix and aspirin. -Stop Plavix at the end of February. -If more than two nitroglycerin tablets are needed in a week, patient should contact the office.  Hyperlipidemia - continue with current statin medication  Morbid obesity - lifestyle modification was encouraged.   Muscle pain - Patient reports muscle pain after strenuous activity, which resolves on its own.-Advise patient to continue monitoring and report any changes.  General Health Maintenance -Check lipid panel with primary care provider. -Follow-up in nine months unless symptoms arise.  The patient is in agreement with the above plan. The patient left the office in stable condition.  The patient will follow up in   Medication Adjustments/Labs and Tests Ordered: Current medicines are reviewed at length with the patient today.  Concerns regarding medicines are outlined above.  Orders Placed This Encounter  Procedures   EKG 12-Lead   No  orders of the defined types were placed in this encounter.   Patient Instructions  Medication Instructions:  Stop: Clopidogrel (PLAVIX) on March 1st, 2025 *If you need a refill on your cardiac medications before your next appointment, please call your pharmacy*  Lab Work: None  Follow-Up: At [facility], you and your health needs are our priority.  As part of our continuing mission to provide you with exceptional heart care, we have created designated Provider Care Teams.  These Care Teams include your primary Cardiologist (physician) and Advanced Practice Providers (APPs -  Physician Assistants and Nurse Practitioners) who all work together to provide you with the care you need, when you need it.  Your next appointment:   9 month(s)  Provider:   Thomasene Ripple, DO     Adopting a Healthy Lifestyle.  Know what a healthy weight is for you (roughly BMI <25) and aim to maintain this   Aim for 7+ servings of fruits and vegetables daily   65-80+ fluid ounces of water or unsweet tea for healthy kidneys   Limit to max 1 drink of alcohol per day; avoid smoking/tobacco   Limit animal fats in diet for cholesterol and heart health - choose grass fed whenever available   Avoid highly processed foods, and foods high in saturated/trans fats   Aim for low stress - take time to unwind and care for your mental health   Aim for 150 min of moderate intensity exercise weekly for heart health, and weights twice weekly for bone health   Aim for 7-9 hours of sleep daily   When it comes to diets, agreement about the perfect plan isnt easy to find, even among the experts. Experts at the [facility] [employer] developed an idea known as the Healthy Eating Plate. Just imagine a plate divided into logical, healthy portions.   The emphasis is on diet quality:   Load up on vegetables and fruits - one-half of your plate: Aim for color and variety, and remember that potatoes dont  count.   Go for whole grains - one-quarter of your plate: Whole wheat, barley, wheat berries, quinoa, oats, brown rice, and foods made with them. If you want pasta, go with whole wheat pasta.   Protein power - one-quarter of your plate: Fish, chicken, beans, and nuts are all healthy, versatile protein sources. Limit red meat.   The diet, however, does go beyond the plate, offering a few other suggestions.   Use healthy plant oils, such as olive,  canola, soy, corn, sunflower and peanut. Check the labels, and avoid partially hydrogenated oil, which have unhealthy trans fats.   If youre thirsty, drink water. Coffee and tea are good in moderation, but skip sugary drinks and limit milk and dairy products to one or two daily servings.   The type of carbohydrate in the diet is more important than the amount. Some sources of carbohydrates, such as vegetables, fruits, whole grains, and beans-are healthier than others.   Finally, stay active  Signed, Thomasene Ripple, DO  06/16/2023 3:29 PM    [facility]

## 2023-06-21 LAB — LAB REPORT - SCANNED: EGFR: 51

## 2023-06-23 ENCOUNTER — Ambulatory Visit
Admission: EM | Admit: 2023-06-23 | Discharge: 2023-06-23 | Disposition: A | Discharge status: Home / Self Care | Payer: Medicare Other | Attending: Family Medicine | Admitting: Family Medicine

## 2023-06-23 DIAGNOSIS — H6123 Impacted cerumen, bilateral: Secondary | ICD-10-CM

## 2023-06-23 MED ORDER — CARBAMIDE PEROXIDE 6.5 % OT SOLN: 5.0000 [drp] | Freq: Two times a day (BID) | OTIC | 0 refills | Status: AC | PRN

## 2023-06-23 NOTE — ED Provider Notes (Signed)
[facility]    CSN: [id number] Arrival date & time: 06/23/23  0910      History   Chief Complaint No chief complaint on file.   HPI Richard Fitton. is a 67 y.o. male.   Presenting today with 2 to 3 weeks of ears feeling clogged, muffled hearing.  Denies pain, drainage or bleeding from the ears, headache, fevers.  Trying water from the showerhead with no relief.  States he has to get his ears cleaned out at least once a year.    Past Medical History:  Diagnosis Date   Aortic dilatation (HCC) 08/2022   38mm   CAD (coronary artery disease) 08/29/2022   DES 1st dx   Hyperlipidemia    Hypertension    OSA (obstructive sleep apnea)    CPAP nightly   Stenosing tenosynovitis of finger of left hand    left index and left middle fingers    Patient Active Problem List   Diagnosis Date Noted   Coronary artery disease involving native coronary artery of native heart with unstable angina pectoris (HCC) 08/29/2022   Coronary artery calcification seen on CT scan 08/10/2021   Morbid obesity (HCC) 08/10/2021   Aortic dilatation (HCC) 08/10/2021   Health education/counseling 08/10/2021   Allergic rhinitis 05/19/2017   Acquired trigger finger 05/22/2015   Other disorders of synovium, tendon, and bursa(727.89) 12/14/2012   Disorder of synovium 12/14/2012   OSA (obstructive sleep apnea) 10/22/2010    Past Surgical History:  Procedure Laterality Date   COLONOSCOPY N/A 06/27/2015   Procedure: COLONOSCOPY;  Surgeon: Malissa Hippo, MD;  [facility];  Service: Endoscopy;  Laterality: N/A;  1030   CORONARY STENT INTERVENTION N/A 08/29/2022   Procedure: CORONARY STENT INTERVENTION;  Surgeon: Kathleene Hazel, MD;  [facility];  Service: Cardiovascular;  Laterality: N/A;   HERNIA REPAIR     LEFT HEART CATH AND CORONARY ANGIOGRAPHY N/A 08/29/2022   Procedure: LEFT HEART CATH AND CORONARY ANGIOGRAPHY;  Surgeon: Kathleene Hazel, MD;   [facility];  Service: Cardiovascular;  Laterality: N/A;   TRIGGER FINGER RELEASE Left 05/09/2015   Procedure: RELEASE TRIGGER FINGER/A-1 PULLEY, left index finger and left middle finger;  Surgeon: Cindee Salt, MD;  Location: [facility];  Service: Orthopedics;  Laterality: Left;   UMBILICAL HERNIA REPAIR         Home Medications    Prior to Admission medications   Medication Sig Start Date End Date Taking? Authorizing Provider  carbamide peroxide (DEBROX) 6.5 % OTIC solution Place 5 drops into both ears 2 (two) times daily as needed. 06/23/23  Yes Particia Nearing, PA-C  allopurinol (ZYLOPRIM) 100 MG tablet Take 100 mg by mouth in the morning. 06/18/21   [provider]  aspirin EC 81 MG tablet Take 81 mg by mouth in the morning.    [provider]  calcium carbonate (TUMS - DOSED IN MG ELEMENTAL CALCIUM) 500 MG chewable tablet Chew 1 tablet by mouth daily as needed for indigestion or heartburn.    [provider]  clopidogrel (PLAVIX) 75 MG tablet TAKE 1 TABLET BY MOUTH EVERY DAY 03/19/23   Tobb, Kardie, DO  magnesium oxide (MAG-OX) 400 (240 Mg) MG tablet Take 400 mg by mouth at bedtime.    [provider]  metoprolol succinate (TOPROL XL) 25 MG 24 hr tablet Take 0.5 tablets (12.5 mg total) by mouth daily. 12/25/22   Thomasene Ripple, DO  nitroGLYCERIN (NITROSTAT) 0.4 MG SL tablet Place 1 tablet (0.4 mg total) under the tongue every 5 (five) minutes as needed for chest pain. 08/29/22 08/29/23  Kathleene Hazel, MD  rosuvastatin (CRESTOR) 20 MG tablet Take 20 mg by mouth at bedtime. 08/09/21   [provider]    Family History Family History  Problem Relation Age of Onset   Heart disease Father     Social History Social History   Tobacco Use   Smoking status: Never   Smokeless tobacco: Never  Vaping Use   Vaping status: Never Used  Substance Use Topics   Alcohol use: No    Alcohol/week: 0.0 standard  drinks of alcohol   Drug use: No     Allergies   Vioxx [rofecoxib]   Review of Systems Review of Systems Per HPI  Physical Exam Triage Vital Signs ED Triage Vitals [06/23/23 0957]  Encounter Vitals Group     BP 133/67     Systolic BP Percentile      Diastolic BP Percentile      Pulse Rate 76     Resp 16     Temp 97.9 F (36.6 C)     Temp Source Oral     SpO2 97 %     Weight      Height      Head Circumference      Peak Flow      Pain Score 0     Pain Loc      Pain Education      Exclude from Growth Chart    No data found.  Updated Vital Signs BP 133/67 (BP Location: Right Arm)   Pulse 76   Temp 97.9 F (36.6 C) (Oral)   Resp 16   SpO2 97%   Visual Acuity Right Eye Distance:   Left Eye Distance:   Bilateral Distance:    Right Eye Near:   Left Eye Near:    Bilateral Near:     Physical Exam Vitals and nursing note reviewed.  Constitutional:      Appearance: Normal appearance.  HENT:     Head: Atraumatic.     Right Ear: There is impacted cerumen.     Left Ear: There is impacted cerumen.     Mouth/Throat:     Mouth: Mucous membranes are moist.  Eyes:     Extraocular Movements: Extraocular movements intact.     Conjunctiva/sclera: Conjunctivae normal.  Cardiovascular:     Rate and Rhythm: Normal rate and regular rhythm.  Pulmonary:     Effort: Pulmonary effort is normal.     Breath sounds: Normal breath sounds.  Musculoskeletal:        General: Normal range of motion.     Cervical back: Normal range of motion and neck supple.  Skin:    General: Skin is warm and dry.  Neurological:     General: No focal deficit present.     Mental Status: He is oriented to person, place, and time.  Psychiatric:        Mood and Affect: Mood normal.        Thought Content: Thought content normal.        Judgment: Judgment normal.      UC Treatments / Results  Labs (all labs ordered are listed, but only abnormal results are displayed) Labs Reviewed - No  data to display  EKG   Radiology No results found.  Procedures Procedures (including critical care time)  Medications Ordered  in UC Medications - No data to display  Initial Impression / Assessment and Plan / UC Course  I have reviewed the triage vital signs and the nursing notes.  Pertinent labs & imaging results that were available during my care of the patient were reviewed by me and considered in my medical decision making (see chart for details).     Ear lavage performed with warm water and peroxide bilaterally with full clearance of wax impactions.  TMs visualized and benign post procedure, and patient states symptomatic relief.  Discussed Debrox drops as needed, return precautions  Final Clinical Impressions(s) / UC Diagnoses   Final diagnoses:  Bilateral impacted cerumen   Discharge Instructions   None    ED Prescriptions     Medication Sig Dispense Auth. Provider   carbamide peroxide (DEBROX) 6.5 % OTIC solution Place 5 drops into both ears 2 (two) times daily as needed. 15 mL Particia Nearing, [place]      PDMP not reviewed this encounter.   Particia Nearing, [place] 06/23/23 1204

## 2023-06-23 NOTE — ED Triage Notes (Signed)
Pt reports he has ear clogging x 2-3 weeks

## 2023-07-15 ENCOUNTER — Telehealth: Payer: Self-pay | Admitting: Cardiology

## 2023-07-15 NOTE — Telephone Encounter (Signed)
 Paper Work Dropped Off: REQUESTED LAB RESULTS   Date: 07/15/2023  Location of paper: DR. [facility] BOX

## 2023-09-11 ENCOUNTER — Other Ambulatory Visit: Payer: Self-pay | Admitting: Cardiovascular Disease

## 2023-12-11 LAB — LAB REPORT - SCANNED: EGFR: 51

## 2023-12-23 ENCOUNTER — Encounter (INDEPENDENT_AMBULATORY_CARE_PROVIDER_SITE_OTHER): Payer: Self-pay | Admitting: *Deleted

## 2023-12-29 ENCOUNTER — Ambulatory Visit (INDEPENDENT_AMBULATORY_CARE_PROVIDER_SITE_OTHER): Admitting: Gastroenterology

## 2023-12-29 ENCOUNTER — Encounter (INDEPENDENT_AMBULATORY_CARE_PROVIDER_SITE_OTHER): Payer: Self-pay | Admitting: *Deleted

## 2023-12-29 ENCOUNTER — Encounter (INDEPENDENT_AMBULATORY_CARE_PROVIDER_SITE_OTHER): Payer: Self-pay | Admitting: Gastroenterology

## 2023-12-29 VITALS — BP 129/70 | HR 88 | Temp 97.9°F | Ht 69.0 in | Wt 256.3 lb

## 2023-12-29 DIAGNOSIS — Z6837 Body mass index (BMI) 37.0-37.9, adult: Secondary | ICD-10-CM | POA: Insufficient documentation

## 2023-12-29 DIAGNOSIS — R1310 Dysphagia, unspecified: Secondary | ICD-10-CM

## 2023-12-29 DIAGNOSIS — K219 Gastro-esophageal reflux disease without esophagitis: Secondary | ICD-10-CM | POA: Diagnosis not present

## 2023-12-29 DIAGNOSIS — R1319 Other dysphagia: Secondary | ICD-10-CM | POA: Insufficient documentation

## 2023-12-29 NOTE — H&P (View-Only) (Signed)
 Samiyyah Moffa Faizan Layonna Dobie , M.D. Gastroenterology & Hepatology [facility] Gastroenterology [address] [place], [place] [address] Primary Care Physician: Richard Carwin, MD [address] [place] [address]  Chief Complaint:  Dysphagia   History of Present Illness: Richard Nolan. is a 68 y.o. male with history of Cad s/p PCI on aspirin  who presents for evaluation of solid food dysphagia.  Patient reports 20 years ago he had similar episode of food getting stuck in middle of his chest which self resolved and never had endoscopy.  Similar thing happen a week ago where he was eating cereal and felt getting stuck in the middle of the chest and patient thought he was having a heart attack.  This resolved by itself as well.  Denies any chest pain or pressure on exertion.The patient denies having any nausea, vomiting, fever, chills, hematochezia, melena, hematemesis, abdominal distention, abdominal pain, diarrhea, jaundice, pruritus or weight loss.  Labs from December 2024 hemoglobin 16 creatinine 1.48 normal liver enzymes  Last ZHI:wnwz Last Colonoscopy:06/2015  Prep satisfactory. Normal mucosa of cecum, ascending colon, hepatic flexure, transverse colon, splenic flexure, descending and sigmoid colon. Normal rectal mucosa. Fall hemorrhoids below the dentate line.    Repeat 10 years  FHx: neg for any gastrointestinal/liver disease, no malignancies Social: neg smoking, alcohol or illicit drug use Surgical: Hernia repair  Past Medical History: Past Medical History:  Diagnosis Date   Aortic dilatation (HCC) 08/2022   38mm   CAD (coronary artery disease) 08/29/2022   DES 1st dx   Hyperlipidemia    Hypertension    OSA (obstructive sleep apnea)    CPAP nightly   Stenosing tenosynovitis of finger of left hand    left index and left middle fingers    Past Surgical History: Past Surgical History:  Procedure Laterality Date   COLONOSCOPY N/A  06/27/2015   Procedure: COLONOSCOPY;  Surgeon: Claudis RAYMOND Rivet, MD;  [facility];  Service: Endoscopy;  Laterality: N/A;  1030   CORONARY STENT INTERVENTION N/A 08/29/2022   Procedure: CORONARY STENT INTERVENTION;  Surgeon: Verlin Lonni BIRCH, MD;  [facility];  Service: Cardiovascular;  Laterality: N/A;   HERNIA REPAIR     LEFT HEART CATH AND CORONARY ANGIOGRAPHY N/A 08/29/2022   Procedure: LEFT HEART CATH AND CORONARY ANGIOGRAPHY;  Surgeon: Verlin Lonni BIRCH, MD;  [facility]: [facility];  Service: Cardiovascular;  Laterality: N/A;   TRIGGER FINGER RELEASE Left 05/09/2015   Procedure: RELEASE TRIGGER FINGER/A-1 PULLEY, left index finger and left middle finger;  Surgeon: Arley Curia, MD;  Location: [facility];  Service: Orthopedics;  Laterality: Left;   UMBILICAL HERNIA REPAIR      Family History: Family History  Problem Relation Age of Onset   Heart disease Father     Social History: Social History   Tobacco Use  Smoking Status Never  Smokeless Tobacco Never   Social History   Substance and Sexual Activity  Alcohol Use No   Alcohol/week: 0.0 standard drinks of alcohol   Social History   Substance and Sexual Activity  Drug Use No    Allergies: Allergies  Allergen Reactions   Vioxx [Rofecoxib] Anaphylaxis    Throat swelling    Medications: Current Outpatient Medications  Medication Sig Dispense Refill   allopurinol  (ZYLOPRIM ) 100 MG tablet Take 100 mg by mouth in the morning.     aspirin  EC 81 MG tablet Take 81 mg by mouth in the morning.  calcium  carbonate (TUMS - DOSED IN MG ELEMENTAL CALCIUM ) 500 MG chewable tablet Chew 1 tablet by mouth daily as needed for indigestion or heartburn.     carbamide peroxide (DEBROX) 6.5 % OTIC solution Place 5 drops into both ears 2 (two) times daily as needed. 15 mL 0   magnesium oxide (MAG-OX) 400 (240 Mg) MG tablet Take 400 mg by mouth at bedtime.     metoprolol   succinate (TOPROL  XL) 25 MG 24 hr tablet Take 0.5 tablets (12.5 mg total) by mouth daily. 45 tablet 3   nitroGLYCERIN  (NITROSTAT ) 0.4 MG SL tablet PLACE 1 TABLET UNDER TONGUE EVERY 5 MINS, UP TO 3 DOSES AS NEEDED FOR CHEST PAIN 25 tablet 9   rosuvastatin  (CRESTOR ) 20 MG tablet Take 20 mg by mouth at bedtime.     clopidogrel  (PLAVIX ) 75 MG tablet TAKE 1 TABLET BY MOUTH EVERY DAY (Patient not taking: Reported on 12/29/2023) 90 tablet 2   No current facility-administered medications for this visit.    Review of Systems: GENERAL: negative for malaise, night sweats HEENT: No changes in hearing or vision, no nose bleeds or other nasal problems. NECK: Negative for lumps, goiter, pain and significant neck swelling RESPIRATORY: Negative for cough, wheezing CARDIOVASCULAR: Negative for chest pain, leg swelling, palpitations, orthopnea GI: SEE HPI MUSCULOSKELETAL: Negative for joint pain or swelling, back pain, and muscle pain. SKIN: Negative for lesions, rash HEMATOLOGY Negative for prolonged bleeding, bruising easily, and swollen nodes. ENDOCRINE: Negative for cold or heat intolerance, polyuria, polydipsia and goiter. NEURO: negative for tremor, gait imbalance, syncope and seizures. The remainder of the review of systems is noncontributory.   Physical Exam: BP 129/70   Pulse 88   Temp 97.9 F (36.6 C)   Ht 5' 9 (1.753 m)   Wt 256 lb 4.8 oz (116.3 kg)   BMI 37.85 kg/m  GENERAL: The patient is AO x3, in no acute distress. HEENT: Head is normocephalic and atraumatic. EOMI are intact. Mouth is well hydrated and without lesions. NECK: Supple. No masses LUNGS: Clear to auscultation. No presence of rhonchi/wheezing/rales. Adequate chest expansion HEART: RRR, normal s1 and s2. ABDOMEN: Soft, nontender, no guarding, no peritoneal signs, and nondistended. BS +. No masses.  Imaging/Labs: as above     Latest Ref Rng & Units 08/27/2022    8:18 AM  CBC  WBC 3.4 - 10.8 x10E3/uL 9.0   Hemoglobin  13.0 - 17.7 g/dL 83.1   Hematocrit 62.4 - 51.0 % 50.1   Platelets 150 - 450 x10E3/uL 225    No results found for: IRON, TIBC, FERRITIN  I personally reviewed and interpreted the available labs, imaging and endoscopic files.  Impression and Plan: Richard Nolan. is a 68 y.o. male with history of Cad s/p PCI on aspirin  who presents for evaluation of solid food dysphagia.  #Solid food dysphagia   Patient has intermittent solid food dysphagia.  Without upper endoscopy cannot rule out web, ring stricture and unlikely malignancy  Will schedule upper endoscopy with biopsies +/- dilation  I thoroughly discussed with the patient the procedure, including the risks involved. Patient understands what the procedure involves including the benefits and any risks. Patient understands alternatives to the proposed procedure. Risks including (but not limited to) bleeding, tearing of the lining (perforation), rupture of adjacent organs, problems with heart and lung function, infection, and medication reactions. A small percentage of complications may require surgery, hospitalization, repeat endoscopic procedure, and/or transfusion.  Patient understood and agreed.   - Cut food  in small pieces and chew food thoroughly to avoid regurgitation episodes. - Discussed avoidance of eating within 3 hours of lying down to sleep and benefit of blocks to elevate head of bed. Also, will benefit from avoiding carbonated drinks/sodas or food that has tomatoes, spicy or greasy food.    #GERD Patient has typical symptoms of GERD and has been avoiding trigger foods which is controlling his symptoms well  GERD take PPI/Pepcid  as needed  #HCM  Last colonoscopy December 2016 and hence patient is due December 2026 for screening colonoscopy  #BMI 37      - walking at a brisk pace/biking at moderate intesity 2.5-5 hours per week     - use pedometer/step counter to track activity     - goal to lose 5-10% of initial  body weight     - avoid suagry drinks and juices, use zero calorie beverages     - increase water  intake     - eat a low carb diet with plenty of veggies and fruit     - Get sufficient sleep 7-8 hrs nightly     - maitain active lifestyle     - avoid alcohol     - recommend 2-3 cups Coffee daily     - Counsel on lowering cholesterol by having a diet rich in vegetables,          protein (avoid red meats) and good fats(fish, salmon).    All questions were answered.      Richard Canady Faizan Kaity Pitstick, MD Gastroenterology and Hepatology [facility] Gastroenterology   This chart has been completed using [facility] Dictation software, and while attempts have been made to ensure accuracy , certain words and phrases may not be transcribed as intended

## 2023-12-29 NOTE — Progress Notes (Signed)
 Samiyyah Moffa Faizan Layonna Dobie , M.D. Gastroenterology & Hepatology [facility] Gastroenterology [address] [place], [place] [address] Primary Care Physician: Sheryle Carwin, MD [address] [place] [address]  Chief Complaint:  Dysphagia   History of Present Illness: Richard Rhoads. is a 68 y.o. male with history of Cad s/p PCI on aspirin  who presents for evaluation of solid food dysphagia.  Patient reports 20 years ago he had similar episode of food getting stuck in middle of his chest which self resolved and never had endoscopy.  Similar thing happen a week ago where he was eating cereal and felt getting stuck in the middle of the chest and patient thought he was having a heart attack.  This resolved by itself as well.  Denies any chest pain or pressure on exertion.The patient denies having any nausea, vomiting, fever, chills, hematochezia, melena, hematemesis, abdominal distention, abdominal pain, diarrhea, jaundice, pruritus or weight loss.  Labs from December 2024 hemoglobin 16 creatinine 1.48 normal liver enzymes  Last ZHI:wnwz Last Colonoscopy:06/2015  Prep satisfactory. Normal mucosa of cecum, ascending colon, hepatic flexure, transverse colon, splenic flexure, descending and sigmoid colon. Normal rectal mucosa. Fall hemorrhoids below the dentate line.    Repeat 10 years  FHx: neg for any gastrointestinal/liver disease, no malignancies Social: neg smoking, alcohol or illicit drug use Surgical: Hernia repair  Past Medical History: Past Medical History:  Diagnosis Date   Aortic dilatation (HCC) 08/2022   38mm   CAD (coronary artery disease) 08/29/2022   DES 1st dx   Hyperlipidemia    Hypertension    OSA (obstructive sleep apnea)    CPAP nightly   Stenosing tenosynovitis of finger of left hand    left index and left middle fingers    Past Surgical History: Past Surgical History:  Procedure Laterality Date   COLONOSCOPY N/A  06/27/2015   Procedure: COLONOSCOPY;  Surgeon: Claudis RAYMOND Rivet, MD;  [facility];  Service: Endoscopy;  Laterality: N/A;  1030   CORONARY STENT INTERVENTION N/A 08/29/2022   Procedure: CORONARY STENT INTERVENTION;  Surgeon: Verlin Lonni BIRCH, MD;  [facility];  Service: Cardiovascular;  Laterality: N/A;   HERNIA REPAIR     LEFT HEART CATH AND CORONARY ANGIOGRAPHY N/A 08/29/2022   Procedure: LEFT HEART CATH AND CORONARY ANGIOGRAPHY;  Surgeon: Verlin Lonni BIRCH, MD;  [facility];  Service: Cardiovascular;  Laterality: N/A;   TRIGGER FINGER RELEASE Left 05/09/2015   Procedure: RELEASE TRIGGER FINGER/A-1 PULLEY, left index finger and left middle finger;  Surgeon: Arley Curia, MD;  Location: [facility];  Service: Orthopedics;  Laterality: Left;   UMBILICAL HERNIA REPAIR      Family History: Family History  Problem Relation Age of Onset   Heart disease Father     Social History: Social History   Tobacco Use  Smoking Status Never  Smokeless Tobacco Never   Social History   Substance and Sexual Activity  Alcohol Use No   Alcohol/week: 0.0 standard drinks of alcohol   Social History   Substance and Sexual Activity  Drug Use No    Allergies: Allergies  Allergen Reactions   Vioxx [Rofecoxib] Anaphylaxis    Throat swelling    Medications: Current Outpatient Medications  Medication Sig Dispense Refill   allopurinol  (ZYLOPRIM ) 100 MG tablet Take 100 mg by mouth in the morning.     aspirin  EC 81 MG tablet Take 81 mg by mouth in the morning.  calcium  carbonate (TUMS - DOSED IN MG ELEMENTAL CALCIUM ) 500 MG chewable tablet Chew 1 tablet by mouth daily as needed for indigestion or heartburn.     carbamide peroxide (DEBROX) 6.5 % OTIC solution Place 5 drops into both ears 2 (two) times daily as needed. 15 mL 0   magnesium oxide (MAG-OX) 400 (240 Mg) MG tablet Take 400 mg by mouth at bedtime.     metoprolol   succinate (TOPROL  XL) 25 MG 24 hr tablet Take 0.5 tablets (12.5 mg total) by mouth daily. 45 tablet 3   nitroGLYCERIN  (NITROSTAT ) 0.4 MG SL tablet PLACE 1 TABLET UNDER TONGUE EVERY 5 MINS, UP TO 3 DOSES AS NEEDED FOR CHEST PAIN 25 tablet 9   rosuvastatin  (CRESTOR ) 20 MG tablet Take 20 mg by mouth at bedtime.     clopidogrel  (PLAVIX ) 75 MG tablet TAKE 1 TABLET BY MOUTH EVERY DAY (Patient not taking: Reported on 12/29/2023) 90 tablet 2   No current facility-administered medications for this visit.    Review of Systems: GENERAL: negative for malaise, night sweats HEENT: No changes in hearing or vision, no nose bleeds or other nasal problems. NECK: Negative for lumps, goiter, pain and significant neck swelling RESPIRATORY: Negative for cough, wheezing CARDIOVASCULAR: Negative for chest pain, leg swelling, palpitations, orthopnea GI: SEE HPI MUSCULOSKELETAL: Negative for joint pain or swelling, back pain, and muscle pain. SKIN: Negative for lesions, rash HEMATOLOGY Negative for prolonged bleeding, bruising easily, and swollen nodes. ENDOCRINE: Negative for cold or heat intolerance, polyuria, polydipsia and goiter. NEURO: negative for tremor, gait imbalance, syncope and seizures. The remainder of the review of systems is noncontributory.   Physical Exam: BP 129/70   Pulse 88   Temp 97.9 F (36.6 C)   Ht 5' 9 (1.753 m)   Wt 256 lb 4.8 oz (116.3 kg)   BMI 37.85 kg/m  GENERAL: The patient is AO x3, in no acute distress. HEENT: Head is normocephalic and atraumatic. EOMI are intact. Mouth is well hydrated and without lesions. NECK: Supple. No masses LUNGS: Clear to auscultation. No presence of rhonchi/wheezing/rales. Adequate chest expansion HEART: RRR, normal s1 and s2. ABDOMEN: Soft, nontender, no guarding, no peritoneal signs, and nondistended. BS +. No masses.  Imaging/Labs: as above     Latest Ref Rng & Units 08/27/2022    8:18 AM  CBC  WBC 3.4 - 10.8 x10E3/uL 9.0   Hemoglobin  13.0 - 17.7 g/dL 83.1   Hematocrit 62.4 - 51.0 % 50.1   Platelets 150 - 450 x10E3/uL 225    No results found for: IRON, TIBC, FERRITIN  I personally reviewed and interpreted the available labs, imaging and endoscopic files.  Impression and Plan: Richard Nolan. is a 68 y.o. male with history of Cad s/p PCI on aspirin  who presents for evaluation of solid food dysphagia.  #Solid food dysphagia   Patient has intermittent solid food dysphagia.  Without upper endoscopy cannot rule out web, ring stricture and unlikely malignancy  Will schedule upper endoscopy with biopsies +/- dilation  I thoroughly discussed with the patient the procedure, including the risks involved. Patient understands what the procedure involves including the benefits and any risks. Patient understands alternatives to the proposed procedure. Risks including (but not limited to) bleeding, tearing of the lining (perforation), rupture of adjacent organs, problems with heart and lung function, infection, and medication reactions. A small percentage of complications may require surgery, hospitalization, repeat endoscopic procedure, and/or transfusion.  Patient understood and agreed.   - Cut food  in small pieces and chew food thoroughly to avoid regurgitation episodes. - Discussed avoidance of eating within 3 hours of lying down to sleep and benefit of blocks to elevate head of bed. Also, will benefit from avoiding carbonated drinks/sodas or food that has tomatoes, spicy or greasy food.    #GERD Patient has typical symptoms of GERD and has been avoiding trigger foods which is controlling his symptoms well  GERD take PPI/Pepcid  as needed  #HCM  Last colonoscopy December 2016 and hence patient is due December 2026 for screening colonoscopy  #BMI 37      - walking at a brisk pace/biking at moderate intesity 2.5-5 hours per week     - use pedometer/step counter to track activity     - goal to lose 5-10% of initial  body weight     - avoid suagry drinks and juices, use zero calorie beverages     - increase water  intake     - eat a low carb diet with plenty of veggies and fruit     - Get sufficient sleep 7-8 hrs nightly     - maitain active lifestyle     - avoid alcohol     - recommend 2-3 cups Coffee daily     - Counsel on lowering cholesterol by having a diet rich in vegetables,          protein (avoid red meats) and good fats(fish, salmon).    All questions were answered.      Richard Nolan Faizan Kaity Pitstick, MD Gastroenterology and Hepatology [facility] Gastroenterology   This chart has been completed using [facility] Dictation software, and while attempts have been made to ensure accuracy , certain words and phrases may not be transcribed as intended

## 2023-12-29 NOTE — Patient Instructions (Signed)
It was very nice to meet you today, as dicussed with will plan for the following : 1) Upper endoscopy

## 2024-01-12 ENCOUNTER — Encounter (HOSPITAL_COMMUNITY)
Admission: RE | Admit: 2024-01-12 | Discharge: 2024-01-12 | Disposition: A | Discharge status: Home / Self Care | Source: Ambulatory Visit | Attending: Gastroenterology | Admitting: Gastroenterology

## 2024-01-12 HISTORY — DX: Gout, unspecified: M10.9

## 2024-01-12 HISTORY — DX: Unspecified osteoarthritis, unspecified site: M19.90

## 2024-01-12 NOTE — Pre-Procedure Instructions (Signed)
Attempted pre-op phone call. Left VM for him to call back.

## 2024-01-13 ENCOUNTER — Encounter (HOSPITAL_COMMUNITY): Payer: Self-pay

## 2024-01-13 ENCOUNTER — Other Ambulatory Visit: Payer: Self-pay

## 2024-01-15 ENCOUNTER — Ambulatory Visit (HOSPITAL_COMMUNITY)
Admission: RE | Admit: 2024-01-15 | Discharge: 2024-01-15 | Disposition: A | Discharge status: Home / Self Care | Source: Ambulatory Visit | Attending: Gastroenterology | Admitting: Gastroenterology

## 2024-01-15 ENCOUNTER — Ambulatory Visit (HOSPITAL_COMMUNITY): Admitting: Anesthesiology

## 2024-01-15 ENCOUNTER — Encounter (HOSPITAL_COMMUNITY): Payer: Self-pay | Admitting: Gastroenterology

## 2024-01-15 ENCOUNTER — Encounter (HOSPITAL_COMMUNITY)
Admission: RE | Disposition: A | Discharge status: Home / Self Care | Payer: Self-pay | Source: Ambulatory Visit | Attending: Gastroenterology

## 2024-01-15 ENCOUNTER — Other Ambulatory Visit: Payer: Self-pay

## 2024-01-15 DIAGNOSIS — I25119 Atherosclerotic heart disease of native coronary artery with unspecified angina pectoris: Secondary | ICD-10-CM

## 2024-01-15 DIAGNOSIS — G4733 Obstructive sleep apnea (adult) (pediatric): Secondary | ICD-10-CM | POA: Insufficient documentation

## 2024-01-15 DIAGNOSIS — K649 Unspecified hemorrhoids: Secondary | ICD-10-CM | POA: Diagnosis not present

## 2024-01-15 DIAGNOSIS — K449 Diaphragmatic hernia without obstruction or gangrene: Secondary | ICD-10-CM

## 2024-01-15 DIAGNOSIS — Z955 Presence of coronary angioplasty implant and graft: Secondary | ICD-10-CM | POA: Insufficient documentation

## 2024-01-15 DIAGNOSIS — Z7982 Long term (current) use of aspirin: Secondary | ICD-10-CM | POA: Insufficient documentation

## 2024-01-15 DIAGNOSIS — K2289 Other specified disease of esophagus: Secondary | ICD-10-CM | POA: Insufficient documentation

## 2024-01-15 DIAGNOSIS — M1990 Unspecified osteoarthritis, unspecified site: Secondary | ICD-10-CM | POA: Diagnosis not present

## 2024-01-15 DIAGNOSIS — R1310 Dysphagia, unspecified: Secondary | ICD-10-CM

## 2024-01-15 DIAGNOSIS — I10 Essential (primary) hypertension: Secondary | ICD-10-CM | POA: Insufficient documentation

## 2024-01-15 DIAGNOSIS — K222 Esophageal obstruction: Secondary | ICD-10-CM | POA: Diagnosis not present

## 2024-01-15 DIAGNOSIS — K2100 Gastro-esophageal reflux disease with esophagitis, without bleeding: Secondary | ICD-10-CM | POA: Diagnosis not present

## 2024-01-15 DIAGNOSIS — Z79899 Other long term (current) drug therapy: Secondary | ICD-10-CM | POA: Insufficient documentation

## 2024-01-15 DIAGNOSIS — K3189 Other diseases of stomach and duodenum: Secondary | ICD-10-CM

## 2024-01-15 DIAGNOSIS — Z1211 Encounter for screening for malignant neoplasm of colon: Secondary | ICD-10-CM | POA: Insufficient documentation

## 2024-01-15 DIAGNOSIS — K2970 Gastritis, unspecified, without bleeding: Secondary | ICD-10-CM

## 2024-01-15 HISTORY — PX: ESOPHAGOGASTRODUODENOSCOPY: SHX5428

## 2024-01-15 SURGERY — EGD (ESOPHAGOGASTRODUODENOSCOPY)
Anesthesia: General

## 2024-01-15 MED ORDER — LACTATED RINGERS IV SOLN: INTRAVENOUS | Status: DC

## 2024-01-15 MED ORDER — PROPOFOL 10 MG/ML IV BOLUS
INTRAVENOUS | Status: DC | PRN
  Administered 2024-01-15: 100 mg via INTRAVENOUS

## 2024-01-15 MED ORDER — PANTOPRAZOLE SODIUM 40 MG PO TBEC: 40.0000 mg | DELAYED_RELEASE_TABLET | Freq: Every day | ORAL | 1 refills | Status: DC

## 2024-01-15 MED ORDER — PROPOFOL 500 MG/50ML IV EMUL
INTRAVENOUS | Status: DC | PRN
  Administered 2024-01-15: 200 ug/kg/min via INTRAVENOUS

## 2024-01-15 MED ORDER — LIDOCAINE 2% (20 MG/ML) 5 ML SYRINGE
INTRAMUSCULAR | Status: DC | PRN
Start: 2024-01-15 — End: 2024-01-15
  Administered 2024-01-15: 100 mg via INTRAVENOUS

## 2024-01-15 NOTE — Interval H&P Note (Signed)
 History and Physical Interval Note:  01/15/2024 1:57 PM  [name].  has presented today for surgery, with the diagnosis of dysphagia.  The various methods of treatment have been discussed with the patient and family. After consideration of risks, benefits and other options for treatment, the patient has consented to  Procedure(s) with comments: EGD (ESOPHAGOGASTRODUODENOSCOPY) (N/A) - 2:15 pm, asa 1/2 as a surgical intervention.  The patient's history has been reviewed, patient examined, no change in status, stable for surgery.  I have reviewed the patient's chart and labs.  Questions were answered to the patient's satisfaction.     Deatrice FALCON Jeslie Lowe

## 2024-01-15 NOTE — Op Note (Signed)
 [facility] Patient Name: Richard Nolan Procedure Date: 01/15/2024 2:49 PM MRN: [id number] Date of Birth: [birth year] Attending MD: Deatrice Dine , MD, [id number] CSN: [id number] Age: 68 Admit Type: Outpatient Procedure:                Upper GI endoscopy Indications:              Dysphagia Providers:                Deatrice Dine, MD, Jon LABOR. Gerome RN, RN,                            Jon Loge Referring MD:              Medicines:                Monitored Anesthesia Care Complications:            No immediate complications. Estimated Blood Loss:     Estimated blood loss was minimal. Procedure:                Pre-Anesthesia Assessment:                           - Prior to the procedure, a History and Physical                            was performed, and patient medications and                            allergies were reviewed. The patient's tolerance of                            previous anesthesia was also reviewed. The risks                            and benefits of the procedure and the sedation                            options and risks were discussed with the patient.                            All questions were answered, and informed consent                            was obtained. Prior Anticoagulants: The patient has                            taken no anticoagulant or antiplatelet agents. ASA                            Grade Assessment: II - A patient with mild systemic                            disease. After reviewing the risks and benefits,  the patient was deemed in satisfactory condition to                            undergo the procedure.                           After obtaining informed consent, the endoscope was                            passed under direct vision. Throughout the                            procedure, the patient's blood pressure, pulse, and                            oxygen saturations were monitored  continuously. The                            GIF-H190 ([id number]) scope was introduced through the                            mouth, and advanced to the second part of duodenum.                            The upper GI endoscopy was accomplished without                            difficulty. The patient tolerated the procedure                            well. Scope In: 3:03:43 PM Scope Out: 3:15:10 PM Total Procedure Duration: 0 hours 11 minutes 27 seconds  Findings:      Mucosal changes characterized by nodularity were found at the       gastroesophageal junction. Biopsies were taken with a cold forceps for       histology.      A mild Schatzki ring was found in the lower third of the esophagus. A       TTS dilator was passed through the scope. Dilation with an 18-19-20 mm       balloon dilator was performed to 20 mm. The dilation site was examined       following endoscope reinsertion and showed complete resolution of       luminal narrowing. Biopsies were obtained from the proximal and distal       esophagus with cold forceps for histology of suspected eosinophilic       esophagitis.      LA Grade A (one or more mucosal breaks less than 5 mm, not extending       between tops of 2 mucosal folds) esophagitis with no bleeding was found       at the gastroesophageal junction.      A 2 cm hiatal hernia was present.      Mildly erythematous mucosa without bleeding was found in the gastric       antrum. Biopsies were taken with a cold forceps for histology.      The duodenal bulb and second  portion of the duodenum were normal. Impression:               - Nodular mucosa in the esophagus. Biopsied.                           - Mild Schatzki ring. Dilated.                           - LA Grade A reflux esophagitis with no bleeding.                           - 2 cm hiatal hernia.                           - Erythematous mucosa in the antrum. Biopsied.                           - Normal duodenal  bulb and second portion of the                            duodenum.                           - Biopsies were taken with a cold forceps for                            evaluation of eosinophilic esophagitis. Moderate Sedation:      Per Anesthesia Care Recommendation:           - Patient has a contact number available for                            emergencies. The signs and symptoms of potential                            delayed complications were discussed with the                            patient. Return to normal activities tomorrow.                            Written discharge instructions were provided to the                            patient.                           - Resume previous diet.                           - Continue present medications.                           - Await pathology results.                           - Repeat upper endoscopy PRN.                           -  PPI Procedure Code(s):        --- Professional ---                           [phone], 59, Esophagogastroduodenoscopy, flexible,                            transoral; with transendoscopic balloon dilation of                            esophagus (less than 30 mm diameter)                           43239, 59, Esophagogastroduodenoscopy, flexible,                            transoral; with biopsy, single or multiple Diagnosis Code(s):        --- Professional ---                           K22.89, Other specified disease of esophagus                           K31.89, Other diseases of stomach and duodenum                           K22.2, Esophageal obstruction                           K21.00, Gastro-esophageal reflux disease with                            esophagitis, without bleeding                           K44.9, Diaphragmatic hernia without obstruction or                            gangrene                           R13.10, Dysphagia, unspecified CPT copyright 2022 American Medical Association. All  rights reserved. The codes documented in this report are preliminary and upon coder review may  be revised to meet current compliance requirements. Deatrice Dine, MD Deatrice Dine, MD 01/15/2024 3:22:19 PM This report has been signed electronically. Number of Addenda: 0

## 2024-01-15 NOTE — Anesthesia Postprocedure Evaluation (Signed)
 Anesthesia Post Note  Patient: Richard Nolan.  Procedure(s) Performed: EGD (ESOPHAGOGASTRODUODENOSCOPY)  Patient location during evaluation: Phase II Anesthesia Type: General Level of consciousness: awake and alert Pain management: pain level controlled Vital Signs Assessment: post-procedure vital signs reviewed and stable Respiratory status: spontaneous breathing, nonlabored ventilation and respiratory function stable Cardiovascular status: blood pressure returned to baseline and stable Postop Assessment: no apparent nausea or vomiting Anesthetic complications: no   There were no known notable events for this encounter.   Last Vitals:  Vitals:   01/15/24 1523 01/15/24 1531  BP: 107/63   Pulse: 74   Resp: 17 (!) 23  Temp: 36.5 C   SpO2: 92% 97%    Last Pain:  Vitals:   01/15/24 1523  TempSrc: Oral  PainSc: Asleep                 Shamone Winzer L Gabreal Worton

## 2024-01-15 NOTE — Discharge Instructions (Signed)

## 2024-01-15 NOTE — Transfer of Care (Signed)
 Immediate Anesthesia Transfer of Care Note  Patient: Richard Nolan.  Procedure(s) Performed: EGD (ESOPHAGOGASTRODUODENOSCOPY)  Patient Location: Short Stay  Anesthesia Type:General  Level of Consciousness: awake, alert , oriented, and patient cooperative  Airway & Oxygen Therapy: Patient Spontanous Breathing and Patient connected to nasal cannula oxygen  Post-op Assessment: Report given to RN, Post -op Vital signs reviewed and stable, and Patient moving all extremities X 4  Post vital signs: Reviewed and stable  Last Vitals:  Vitals Value Taken Time  BP 107/63 1525  Temp 97.7 1525  Pulse 80 1525  Resp 20 1525  SpO2 96 1525    Last Pain:  Vitals:   01/15/24 1456  TempSrc:   PainSc: 0-No pain         Complications: No notable events documented.

## 2024-01-15 NOTE — Anesthesia Preprocedure Evaluation (Signed)
 Anesthesia Evaluation  Patient identified by MRN, date of birth, ID band Patient awake    Reviewed: Allergy & Precautions, H&P , NPO status , Patient's Chart, lab work & pertinent test results, reviewed documented beta blocker date and time   Airway Mallampati: III  TM Distance: >3 FB Neck ROM: Full    Dental no notable dental hx. (+) Dental Advisory Given, Teeth Intact   Pulmonary neg pulmonary ROS, sleep apnea and Continuous Positive Airway Pressure Ventilation    Pulmonary exam normal breath sounds clear to auscultation       Cardiovascular hypertension, + angina  + CAD and + Cardiac Stents  Normal cardiovascular exam Rhythm:Regular Rate:Normal  History unstable angina but has had stent this year and symptoms are gone   Neuro/Psych negative neurological ROS  negative psych ROS   GI/Hepatic Neg liver ROS,GERD  ,,dysphagia   Endo/Other  negative endocrine ROS    Renal/GU negative Renal ROS  negative genitourinary   Musculoskeletal  (+) Arthritis , Osteoarthritis,    Abdominal   Peds  Hematology negative hematology ROS (+)   Anesthesia Other Findings   Reproductive/Obstetrics negative OB ROS                              Anesthesia Physical Anesthesia Plan  ASA: 3  Anesthesia Plan: General   Post-op Pain Management: Minimal or no pain anticipated   Induction: Intravenous  PONV Risk Score and Plan: Propofol  infusion  Airway Management Planned: Natural Airway and Nasal Cannula  Additional Equipment: None  Intra-op Plan:   Post-operative Plan:   Informed Consent: I have reviewed the patients History and Physical, chart, labs and discussed the procedure including the risks, benefits and alternatives for the proposed anesthesia with the patient or authorized representative who has indicated his/her understanding and acceptance.     Dental Advisory Given  Plan Discussed with:  CRNA  Anesthesia Plan Comments:          Anesthesia Quick Evaluation

## 2024-01-16 ENCOUNTER — Encounter (HOSPITAL_COMMUNITY): Payer: Self-pay | Admitting: Gastroenterology

## 2024-01-19 LAB — SURGICAL PATHOLOGY

## 2024-01-27 ENCOUNTER — Ambulatory Visit (INDEPENDENT_AMBULATORY_CARE_PROVIDER_SITE_OTHER): Payer: Self-pay | Admitting: Gastroenterology

## 2024-01-29 NOTE — Progress Notes (Signed)
Patient result letter mailed procedure note and pathology result faxed to PCP

## 2024-02-07 ENCOUNTER — Other Ambulatory Visit (INDEPENDENT_AMBULATORY_CARE_PROVIDER_SITE_OTHER): Payer: Self-pay | Admitting: Gastroenterology

## 2024-02-22 ENCOUNTER — Other Ambulatory Visit: Payer: Self-pay | Admitting: Cardiology

## 2024-02-24 ENCOUNTER — Telehealth: Payer: Self-pay | Admitting: Cardiology

## 2024-02-24 MED ORDER — METOPROLOL SUCCINATE ER 25 MG PO TB24: 12.5000 mg | ORAL_TABLET | Freq: Every day | ORAL | 0 refills | Status: DC

## 2024-02-24 NOTE — Telephone Encounter (Signed)
*  STAT* If patient is at the pharmacy, call can be transferred to refill team.   1. Which medications need to be refilled? (please list name of each medication and dose if known)   metoprolol  succinate (TOPROL  XL) 25 MG 24 hr tablet     2. Would you like to learn more about the convenience, safety, & potential cost savings by using the [facility]? No   3. Are you open to using the [facility] (Type [facility]. ). No   4. Which pharmacy/location (including street and city if local pharmacy) is medication to be sent to? [id number] - [place], [place] - [address] AT [address]     5. Do they need a 30 day or 90 day supply? 90 day

## 2024-02-24 NOTE — Telephone Encounter (Signed)
 Pt's medication was sent to pt's pharmacy as requested. Confirmation received.

## 2024-03-10 ENCOUNTER — Encounter: Payer: Self-pay | Admitting: *Deleted

## 2024-03-11 ENCOUNTER — Telehealth (INDEPENDENT_AMBULATORY_CARE_PROVIDER_SITE_OTHER): Payer: Self-pay | Admitting: Gastroenterology

## 2024-03-11 NOTE — Telephone Encounter (Signed)
 Pt came into office; secure chat from [place]: I have Richard Nolan out here with claudetta about his medication that starts with a P  Pulled pt back into room and pt wants to know how long is he to be on the Protonix . Pt is wanting to know if there is a time limit on how long he can take Protonix . Doing fine, no issues. Made pt aware that provider is out of the office. Pt verbalized understanding. Please advise. Thank you!   Callback Number-[phone]

## 2024-03-12 ENCOUNTER — Encounter: Payer: Self-pay | Admitting: Cardiology

## 2024-03-12 ENCOUNTER — Ambulatory Visit: Attending: Cardiology | Admitting: Cardiology

## 2024-03-12 VITALS — BP 136/73 | HR 69 | Ht 69.0 in | Wt 257.4 lb

## 2024-03-12 DIAGNOSIS — E785 Hyperlipidemia, unspecified: Secondary | ICD-10-CM | POA: Insufficient documentation

## 2024-03-12 DIAGNOSIS — I10 Essential (primary) hypertension: Secondary | ICD-10-CM | POA: Insufficient documentation

## 2024-03-12 DIAGNOSIS — I2510 Atherosclerotic heart disease of native coronary artery without angina pectoris: Secondary | ICD-10-CM | POA: Diagnosis present

## 2024-03-12 NOTE — Patient Instructions (Signed)

## 2024-03-12 NOTE — Progress Notes (Unsigned)
 Cardiology Office Note:    Date:  03/14/2024   ID:  Richard ONEIDA Auston Mickey., DOB 20-Jul-1955, MRN [id number]  PCP:  Sheryle Carwin, MD  Cardiologist:  Dub Huntsman, DO  Electrophysiologist:  None   Referring MD: Sheryle Carwin, MD    I am doing well  History of Present Illness:    Richard Nolan. is a 68 y.o. male with a hx of CAD (PTCA/DES x 1 first Diagonal branch 08/29/22), hypertension, HLD, prediabetes, aortic dilatation (38mm 08/08/21), OSA, obesity.  Since his last visit he has stopped the plavix  as advised.   He offers no complaints at this time. He remains active - however recently retired.     Past Medical History:  Diagnosis Date   Aortic dilatation (HCC) 08/2022   38mm   Arthritis    CAD (coronary artery disease) 08/29/2022   DES 1st dx   Gout    Hyperlipidemia    Hypertension    OSA (obstructive sleep apnea)    CPAP nightly   Stenosing tenosynovitis of finger of left hand    left index and left middle fingers    Past Surgical History:  Procedure Laterality Date   COLONOSCOPY N/A 06/27/2015   Procedure: COLONOSCOPY;  Surgeon: Claudis RAYMOND Rivet, MD;  [facility]: [facility];  Service: Endoscopy;  Laterality: N/A;  1030   CORONARY STENT INTERVENTION N/A 08/29/2022   Procedure: CORONARY STENT INTERVENTION;  Surgeon: Verlin Lonni BIRCH, MD;  Location: [facility];  Service: Cardiovascular;  Laterality: N/A;   ESOPHAGOGASTRODUODENOSCOPY N/A 01/15/2024   Procedure: EGD (ESOPHAGOGASTRODUODENOSCOPY);  Surgeon: Cinderella Deatrice FALCON, MD;  Location: [facility];  Service: Endoscopy;  Laterality: N/A;  2:15 pm, asa 1/2   HERNIA REPAIR     LEFT HEART CATH AND CORONARY ANGIOGRAPHY N/A 08/29/2022   Procedure: LEFT HEART CATH AND CORONARY ANGIOGRAPHY;  Surgeon: Verlin Lonni BIRCH, MD;  Location: [facility];  Service: Cardiovascular;  Laterality: N/A;   TRIGGER FINGER RELEASE Left 05/09/2015   Procedure: RELEASE TRIGGER FINGER/A-1 PULLEY, left index finger and left  middle finger;  Surgeon: Arley Curia, MD;  Location: [facility];  Service: Orthopedics;  Laterality: Left;   UMBILICAL HERNIA REPAIR      Current Medications: Current Meds  Medication Sig   allopurinol  (ZYLOPRIM ) 100 MG tablet Take 100 mg by mouth in the morning.   aspirin  EC 81 MG tablet Take 81 mg by mouth in the morning.   calcium  carbonate (TUMS - DOSED IN MG ELEMENTAL CALCIUM ) 500 MG chewable tablet Chew 1 tablet by mouth daily as needed for indigestion or heartburn.   carbamide peroxide (DEBROX) 6.5 % OTIC solution Place 5 drops into both ears 2 (two) times daily as needed.   glucosamine-chondroitin 500-400 MG tablet Take 1 tablet by mouth 3 (three) times daily.   magnesium oxide (MAG-OX) 400 (240 Mg) MG tablet Take 400 mg by mouth at bedtime.   metoprolol  succinate (TOPROL  XL) 25 MG 24 hr tablet Take 0.5 tablets (12.5 mg total) by mouth daily.   nitroGLYCERIN  (NITROSTAT ) 0.4 MG SL tablet PLACE 1 TABLET UNDER TONGUE EVERY 5 MINS, UP TO 3 DOSES AS NEEDED FOR CHEST PAIN   pantoprazole  (PROTONIX ) 40 MG tablet TAKE 1 TABLET BY MOUTH EVERY DAY   rosuvastatin  (CRESTOR ) 20 MG tablet Take 20 mg by mouth at bedtime.     Allergies:   Vioxx [rofecoxib]   Social History   Socioeconomic History   Marital status: Married  Spouse name: Not on file   Number of children: Y   Years of education: Not on file   Highest education level: Not on file  Occupational History   Occupation: truck driver  Tobacco Use   Smoking status: Never   Smokeless tobacco: Never  Vaping Use   Vaping status: Never Used  Substance and Sexual Activity   Alcohol use: No    Alcohol/week: 0.0 standard drinks of alcohol   Drug use: No   Sexual activity: Never  Other Topics Concern   Not on file  Social History Narrative   Not on file   Social Drivers of Health   Financial Resource Strain: Not on file  Food Insecurity: Not on file  Transportation Needs: Not on file  Physical Activity: Not on  file  Stress: Not on file  Social Connections: Not on file     Family History: The patient's family history includes Heart disease in his father.  ROS:   Review of Systems  Constitution: Negative for decreased appetite, fever and weight gain.  HENT: Negative for congestion, ear discharge, hoarse voice and sore throat.   Eyes: Negative for discharge, redness, vision loss in right eye and visual halos.  Cardiovascular: Negative for chest pain, dyspnea on exertion, leg swelling, orthopnea and palpitations.  Respiratory: Negative for cough, hemoptysis, shortness of breath and snoring.   Endocrine: Negative for heat intolerance and polyphagia.  Hematologic/Lymphatic: Negative for bleeding problem. Does not bruise/bleed easily.  Skin: Negative for flushing, nail changes, rash and suspicious lesions.  Musculoskeletal: Negative for arthritis, joint pain, muscle cramps, myalgias, neck pain and stiffness.  Gastrointestinal: Negative for abdominal pain, bowel incontinence, diarrhea and excessive appetite.  Genitourinary: Negative for decreased libido, genital sores and incomplete emptying.  Neurological: Negative for brief paralysis, focal weakness, headaches and loss of balance.  Psychiatric/Behavioral: Negative for altered mental status, depression and suicidal ideas.  Allergic/Immunologic: Negative for HIV exposure and persistent infections.    EKGs/Labs/Other Studies Reviewed:    The following studies were reviewed today:   EKG:  The ekg ordered today demonstrates   Recent Labs: No results found for requested labs within last 365 days.  Recent Lipid Panel    Component Value Date/Time   CHOL 101 08/15/2022 0846   TRIG 87 08/15/2022 0846   HDL 42 08/15/2022 0846   CHOLHDL 2.4 08/15/2022 0846   LDLCALC 42 08/15/2022 0846    Physical Exam:    VS:  BP 136/73 (BP Location: Right Arm, Patient Position: Sitting, Cuff Size: Large)   Pulse 69   Ht 5' 9 (1.753 m)   Wt 257 lb 6.4 oz  (116.8 kg)   SpO2 92%   BMI 38.01 kg/m     Wt Readings from Last 3 Encounters:  03/12/24 257 lb 6.4 oz (116.8 kg)  01/15/24 256 lb 4.8 oz (116.3 kg)  01/13/24 256 lb 4.8 oz (116.3 kg)     GEN: Well nourished, well developed in no acute distress HEENT: Normal NECK: No JVD; No carotid bruits LYMPHATICS: No lymphadenopathy CARDIAC: S1S2 noted,RRR, no murmurs, rubs, gallops RESPIRATORY:  Clear to auscultation without rales, wheezing or rhonchi  ABDOMEN: Soft, non-tender, non-distended, +bowel sounds, no guarding. EXTREMITIES: No edema, No cyanosis, no clubbing MUSCULOSKELETAL:  No deformity  SKIN: Warm and dry NEUROLOGIC:  Alert and oriented x 3, non-focal PSYCHIATRIC:  Normal affect, good insight  ASSESSMENT:    1. Hypertension, unspecified type   2. Coronary artery calcification seen on CT scan   3. Hyperlipidemia,  unspecified hyperlipidemia type   4. Morbid obesity (HCC)    PLAN:     CAD - no anginal symptoms. Continue Aspirin  and statin  Hyperlipidemia - continue with current statin medication  Morbid obesity - lifestyle modification was encouraged.    The patient is in agreement with the above plan. The patient left the office in stable condition.  The patient will follow up in   Medication Adjustments/Labs and Tests Ordered: Current medicines are reviewed at length with the patient today.  Concerns regarding medicines are outlined above.  Orders Placed This Encounter  Procedures   Lab report - scanned   EKG 12-Lead   No orders of the defined types were placed in this encounter.   Patient Instructions  Medication Instructions:  Your physician recommends that you continue on your current medications as directed. Please refer to the Current Medication list given to you today.  *If you need a refill on your cardiac medications before your next appointment, please call your pharmacy*  Follow-Up: At [facility], you and your health needs are our  priority.  As part of our continuing mission to provide you with exceptional heart care, our providers are all part of one team.  This team includes your primary Cardiologist (physician) and Advanced Practice Providers or APPs (Physician Assistants and Nurse Practitioners) who all work together to provide you with the care you need, when you need it.  Your next appointment:   1 year(s)  Provider:   Henchy Mccauley, DO              Adopting a Healthy Lifestyle.  Know what a healthy weight is for you (roughly BMI <25) and aim to maintain this   Aim for 7+ servings of fruits and vegetables daily   65-80+ fluid ounces of water  or unsweet tea for healthy kidneys   Limit to max 1 drink of alcohol per day; avoid smoking/tobacco   Limit animal fats in diet for cholesterol and heart health - choose grass fed whenever available   Avoid highly processed foods, and foods high in saturated/trans fats   Aim for low stress - take time to unwind and care for your mental health   Aim for 150 min of moderate intensity exercise weekly for heart health, and weights twice weekly for bone health   Aim for 7-9 hours of sleep daily   When it comes to diets, agreement about the perfect plan isnt easy to find, even among the experts. Experts at the [facility] of [employer] developed an idea known as the Healthy Eating Plate. Just imagine a plate divided into logical, healthy portions.   The emphasis is on diet quality:   Load up on vegetables and fruits - one-half of your plate: Aim for color and variety, and remember that potatoes dont count.   Go for whole grains - one-quarter of your plate: Whole wheat, barley, wheat berries, quinoa, oats, brown rice, and foods made with them. If you want pasta, go with whole wheat pasta.   Protein power - one-quarter of your plate: Fish, chicken, beans, and nuts are all healthy, versatile protein sources. Limit red meat.   The diet, however, does go beyond  the plate, offering a few other suggestions.   Use healthy plant oils, such as olive, canola, soy, corn, sunflower and peanut. Check the labels, and avoid partially hydrogenated oil, which have unhealthy trans fats.   If youre thirsty, drink water . Coffee and tea are good in moderation,  but skip sugary drinks and limit milk and dairy products to one or two daily servings.   The type of carbohydrate in the diet is more important than the amount. Some sources of carbohydrates, such as vegetables, fruits, whole grains, and beans-are healthier than others.   Finally, stay active  Signed, Tatiyana Foucher, DO  03/14/2024 6:26 PM    [facility]

## 2024-03-15 NOTE — Telephone Encounter (Signed)
 Hi Tanya ,  Can you please call the patient and tell the patient that he can take Protonix  for 6 to 8 weeks in total, he can stop after that.  He may continue taking as needed thereafter.  Thanks,  Sinda Leedom Faizan Dan Dissinger, MD Gastroenterology and Hepatology [facility]

## 2024-03-15 NOTE — Telephone Encounter (Signed)
 Pt contacted and verbalized understanding.

## 2024-03-17 ENCOUNTER — Ambulatory Visit
Admission: EM | Admit: 2024-03-17 | Discharge: 2024-03-17 | Disposition: A | Discharge status: Home / Self Care | Attending: Nurse Practitioner | Admitting: Nurse Practitioner

## 2024-03-17 DIAGNOSIS — B9689 Other specified bacterial agents as the cause of diseases classified elsewhere: Secondary | ICD-10-CM

## 2024-03-17 DIAGNOSIS — J069 Acute upper respiratory infection, unspecified: Secondary | ICD-10-CM

## 2024-03-17 MED ORDER — AMOXICILLIN-POT CLAVULANATE 875-125 MG PO TABS
1.0000 | ORAL_TABLET | Freq: Two times a day (BID) | ORAL | 0 refills | Status: DC
Start: 2024-03-17 — End: 2024-04-02

## 2024-03-17 NOTE — ED Provider Notes (Signed)
 [facility]    CSN: [id number] Arrival date & time: 03/17/24  9195      History   Chief Complaint No chief complaint on file.   HPI Richard Nolan. is a 69 y.o. male.   The history is provided by the patient.   Patient presents with a 2-week history of nasal congestion, headache, bilateral ear fullness subjective fever, sinus pressure, postnasal drainage, and cough.  Patient denies ear drainage, wheezing, difficulty breathing, chest pain, abdominal pain, nausea, vomiting, diarrhea, or rash.  Patient states he has been using several over-the-counter cough and cold medications including antihistamines, Mucinex , Vicks, and Dimetapp with minimal relief.  Past Medical History:  Diagnosis Date   Aortic dilatation (HCC) 08/2022   38mm   Arthritis    CAD (coronary artery disease) 08/29/2022   DES 1st dx   Gout    Hyperlipidemia    Hypertension    OSA (obstructive sleep apnea)    CPAP nightly   Stenosing tenosynovitis of finger of left hand    left index and left middle fingers    Patient Active Problem List   Diagnosis Date Noted   Esophageal stricture 01/15/2024   Gastritis and gastroduodenitis 01/15/2024   BMI 37.0-37.9, adult 12/29/2023   Gastroesophageal reflux disease 12/29/2023   Esophageal dysphagia 12/29/2023   Coronary artery disease involving native coronary artery of native heart with unstable angina pectoris (HCC) 08/29/2022   Coronary artery calcification seen on CT scan 08/10/2021   Morbid obesity (HCC) 08/10/2021   Aortic dilatation (HCC) 08/10/2021   Health education/counseling 08/10/2021   Allergic rhinitis 05/19/2017   Acquired trigger finger 05/22/2015   Other disorders of synovium, tendon, and bursa(727.89) 12/14/2012   Disorder of synovium 12/14/2012   OSA (obstructive sleep apnea) 10/22/2010    Past Surgical History:  Procedure Laterality Date   COLONOSCOPY N/A 06/27/2015   Procedure: COLONOSCOPY;  Surgeon: Claudis RAYMOND Rivet, MD;   Location: [facility];  Service: Endoscopy;  Laterality: N/A;  1030   CORONARY STENT INTERVENTION N/A 08/29/2022   Procedure: CORONARY STENT INTERVENTION;  Surgeon: Verlin Lonni BIRCH, MD;  Location: [facility];  Service: Cardiovascular;  Laterality: N/A;   ESOPHAGOGASTRODUODENOSCOPY N/A 01/15/2024   Procedure: EGD (ESOPHAGOGASTRODUODENOSCOPY);  Surgeon: Cinderella Deatrice FALCON, MD;  Location: [facility];  Service: Endoscopy;  Laterality: N/A;  2:15 pm, asa 1/2   HERNIA REPAIR     LEFT HEART CATH AND CORONARY ANGIOGRAPHY N/A 08/29/2022   Procedure: LEFT HEART CATH AND CORONARY ANGIOGRAPHY;  Surgeon: Verlin Lonni BIRCH, MD;  Location: [facility];  Service: Cardiovascular;  Laterality: N/A;   TRIGGER FINGER RELEASE Left 05/09/2015   Procedure: RELEASE TRIGGER FINGER/A-1 PULLEY, left index finger and left middle finger;  Surgeon: Arley Curia, MD;  Location: [facility];  Service: Orthopedics;  Laterality: Left;   UMBILICAL HERNIA REPAIR         Home Medications    Prior to Admission medications   Medication Sig Start Date End Date Taking? Authorizing Provider  allopurinol  (ZYLOPRIM ) 100 MG tablet Take 100 mg by mouth in the morning. 06/18/21   [provider]  aspirin  EC 81 MG tablet Take 81 mg by mouth in the morning.    [provider]  calcium  carbonate (TUMS - DOSED IN MG ELEMENTAL CALCIUM ) 500 MG chewable tablet Chew 1 tablet by mouth daily as needed for indigestion or heartburn.    [provider]  carbamide peroxide (DEBROX) 6.5 % OTIC  solution Place 5 drops into both ears 2 (two) times daily as needed. 06/23/23   Stuart Vernell Norris, PA-C  glucosamine-chondroitin 500-400 MG tablet Take 1 tablet by mouth 3 (three) times daily.    [provider]  magnesium oxide (MAG-OX) 400 (240 Mg) MG tablet Take 400 mg by mouth at bedtime.    [provider]  metoprolol  succinate (TOPROL  XL) 25 MG 24 hr tablet Take 0.5  tablets (12.5 mg total) by mouth daily. 02/24/24   Tobb, Kardie, DO  nitroGLYCERIN  (NITROSTAT ) 0.4 MG SL tablet PLACE 1 TABLET UNDER TONGUE EVERY 5 MINS, UP TO 3 DOSES AS NEEDED FOR CHEST PAIN 09/11/23   Tobb, Kardie, DO  pantoprazole  (PROTONIX ) 40 MG tablet TAKE 1 TABLET BY MOUTH EVERY DAY 02/09/24   Ahmed, Deatrice FALCON, MD  rosuvastatin  (CRESTOR ) 20 MG tablet Take 20 mg by mouth at bedtime. 08/09/21   [provider]    Family History Family History  Problem Relation Age of Onset   Heart disease Father     Social History Social History   Tobacco Use   Smoking status: Never   Smokeless tobacco: Never  Vaping Use   Vaping status: Never Used  Substance Use Topics   Alcohol use: No    Alcohol/week: 0.0 standard drinks of alcohol   Drug use: No     Allergies   Vioxx [rofecoxib]   Review of Systems Review of Systems Per HPI  Physical Exam Triage Vital Signs ED Triage Vitals  Encounter Vitals Group     BP 03/17/24 0819 134/76     Girls Systolic BP Percentile --      Girls Diastolic BP Percentile --      Boys Systolic BP Percentile --      Boys Diastolic BP Percentile --      Pulse Rate 03/17/24 0819 73     Resp 03/17/24 0819 20     Temp 03/17/24 0819 97.6 F (36.4 C)     Temp Source 03/17/24 0819 Oral     SpO2 03/17/24 0819 91 %     Weight --      Height --      Head Circumference --      Peak Flow --      Pain Score 03/17/24 0823 6     Pain Loc --      Pain Education --      Exclude from Growth Chart --    No data found.  Updated Vital Signs BP 134/76 (BP Location: Right Arm)   Pulse 73   Temp 97.6 F (36.4 C) (Oral)   Resp 20   SpO2 91%   Visual Acuity Right Eye Distance:   Left Eye Distance:   Bilateral Distance:    Right Eye Near:   Left Eye Near:    Bilateral Near:     Physical Exam Vitals and nursing note reviewed.  Constitutional:      General: He is not in acute distress.    Appearance: Normal appearance. He is well-developed.   HENT:     Head: Normocephalic and atraumatic.     Right Ear: Tympanic membrane, ear canal and external ear normal.     Left Ear: Tympanic membrane, ear canal and external ear normal.     Nose: Congestion present.     Right Turbinates: Enlarged and swollen.     Left Turbinates: Enlarged and swollen.     Right Sinus: No maxillary sinus tenderness or frontal sinus tenderness.  Left Sinus: No maxillary sinus tenderness or frontal sinus tenderness.     Mouth/Throat:     Mouth: Mucous membranes are moist.     Pharynx: No posterior oropharyngeal erythema.  Eyes:     Conjunctiva/sclera: Conjunctivae normal.     Pupils: Pupils are equal, round, and reactive to light.  Neck:     Thyroid: No thyromegaly.     Trachea: No tracheal deviation.  Cardiovascular:     Rate and Rhythm: Normal rate and regular rhythm.     Pulses: Normal pulses.     Heart sounds: Normal heart sounds.  Pulmonary:     Effort: Pulmonary effort is normal.     Breath sounds: Normal breath sounds.  Abdominal:     General: Bowel sounds are normal.     Palpations: Abdomen is soft.     Tenderness: There is no abdominal tenderness.  Musculoskeletal:     Cervical back: Normal range of motion and neck supple.  Skin:    General: Skin is warm and dry.  Neurological:     General: No focal deficit present.     Mental Status: He is alert and oriented to person, place, and time.  Psychiatric:        Mood and Affect: Mood normal.        Behavior: Behavior normal.        Thought Content: Thought content normal.        Judgment: Judgment normal.      UC Treatments / Results  Labs (all labs ordered are listed, but only abnormal results are displayed) Labs Reviewed - No data to display  EKG   Radiology No results found.  Procedures Procedures (including critical care time)  Medications Ordered in UC Medications - No data to display  Initial Impression / Assessment and Plan / UC Course  I have reviewed the  triage vital signs and the nursing notes.  Pertinent labs & imaging results that were available during my care of the patient were reviewed by me and considered in my medical decision making (see chart for details).  Patient with a 2-week history of upper respiratory symptoms.  Symptoms have not been relieved with over-the-counter cough and cold medications.  He also reports subjective fever.  Given the duration of his symptoms, we will treat with Augmentin  875/125 mg for bacterial upper respiratory infection.  Patient question patient reports that he does have Flonase  at home which he will begin using.  Also would like patient to continue use of Mucinex .  Supportive care recommendations were provided and discussed with the patient to include fluids, rest, over-the-counter analgesics, and use of normal saline nasal spray.  Discussed indications with patient regarding follow-up.  Patient was in agreement with this plan of care and verbalizes understanding.  All questions were answered.  Patient stable for discharge.  Final Clinical Impressions(s) / UC Diagnoses   Final diagnoses:  None   Discharge Instructions   None    ED Prescriptions   None    PDMP not reviewed this encounter.   Gilmer Etta PARAS, NP 03/17/24 [phone]

## 2024-03-17 NOTE — Discharge Instructions (Signed)
 Take medication as directed. Continue your current allergy medication regimen. Increase fluids and get plenty of rest. May take over-the-counter Tylenol  as needed for pain, fever, or general discomfort. Recommend normal saline nasal spray to help with nasal congestion throughout the day. For your cough, it may be helpful to use a humidifier at bedtime during sleep. If your symptoms fail to improve with this treatment, you may follow-up in this clinic or with your primary care physician for further evaluation. Follow-up as needed.

## 2024-03-17 NOTE — ED Triage Notes (Signed)
 Pt reports post nasal drip,sinus drainage, cough, congestion x 2 weeks. Pt has tried OTC cough and cold antihistamines, mucinex  tablets vicks cough and dimetap has found no relief with OTC meds.

## 2024-04-02 ENCOUNTER — Ambulatory Visit
Admission: EM | Admit: 2024-04-02 | Discharge: 2024-04-02 | Disposition: A | Discharge status: Home / Self Care | Attending: Family Medicine | Admitting: Family Medicine

## 2024-04-02 ENCOUNTER — Encounter: Payer: Self-pay | Admitting: Emergency Medicine

## 2024-04-02 DIAGNOSIS — J329 Chronic sinusitis, unspecified: Secondary | ICD-10-CM | POA: Diagnosis not present

## 2024-04-02 DIAGNOSIS — J3089 Other allergic rhinitis: Secondary | ICD-10-CM

## 2024-04-02 DIAGNOSIS — B9789 Other viral agents as the cause of diseases classified elsewhere: Secondary | ICD-10-CM

## 2024-04-02 MED ORDER — AZELASTINE HCL 0.1 % NA SOLN: 1.0000 | Freq: Two times a day (BID) | NASAL | 2 refills | Status: AC

## 2024-04-02 MED ORDER — CETIRIZINE HCL 10 MG PO TABS: 10.0000 mg | ORAL_TABLET | Freq: Every day | ORAL | 2 refills | Status: DC

## 2024-04-02 MED ORDER — DEXAMETHASONE SODIUM PHOSPHATE 10 MG/ML IJ SOLN
10.0000 mg | Freq: Once | INTRAMUSCULAR | Status: AC
  Administered 2024-04-02: 10 mg via INTRAMUSCULAR

## 2024-04-02 NOTE — ED Triage Notes (Signed)
 Cough, nasal congestion, body aches  Has been taking tylenol .  Was seen on 9/17 and was given amoxil .  States symptoms started to go away and now has come back.

## 2024-04-02 NOTE — Discharge Instructions (Signed)
 In addition to a daily consistent regimen of Zyrtec and Flonase  and/or Astelin to control any seasonal allergy component, use saline sinus rinses several times daily until feeling much better, continue Coricidin HBP, plain Mucinex , cough syrup as needed.  We have also given you a steroid shot today to help with the inflammation and exacerbated seasonal allergy symptoms.  I do believe you have caught a virus on top of uncontrolled seasonal allergies.  Follow-up for significantly worsening symptoms.

## 2024-04-04 NOTE — ED Provider Notes (Signed)
 [facility]    CSN: [id number] Arrival date & time: 04/02/24  0848      History   Chief Complaint No chief complaint on file.   HPI Richard Nolan. is a 68 y.o. male.   Patient presenting today with 1 day history of cough, congestion, sinus pain and pressure, body aches, fatigue.  Denies fever, chest pain, shortness of breath, abdominal pain, vomiting, diarrhea.  So far trying Tylenol  with minimal relief and has been doing sinus rinses, Mucinex , cough syrup.  Was seen for similar symptoms on 917 and given amoxicillin  which seemed to clear things up temporarily but states symptoms are coming back.    Past Medical History:  Diagnosis Date   Aortic dilatation 08/2022   38mm   Arthritis    CAD (coronary artery disease) 08/29/2022   DES 1st dx   Gout    Hyperlipidemia    Hypertension    OSA (obstructive sleep apnea)    CPAP nightly   Stenosing tenosynovitis of finger of left hand    left index and left middle fingers    Patient Active Problem List   Diagnosis Date Noted   Esophageal stricture 01/15/2024   Gastritis and gastroduodenitis 01/15/2024   BMI 37.0-37.9, adult 12/29/2023   Gastroesophageal reflux disease 12/29/2023   Esophageal dysphagia 12/29/2023   Coronary artery disease involving native coronary artery of native heart with unstable angina pectoris (HCC) 08/29/2022   Coronary artery calcification seen on CT scan 08/10/2021   Morbid obesity (HCC) 08/10/2021   Aortic dilatation 08/10/2021   Health education/counseling 08/10/2021   Allergic rhinitis 05/19/2017   Acquired trigger finger 05/22/2015   Other disorders of synovium, tendon, and bursa(727.89) 12/14/2012   Disorder of synovium 12/14/2012   OSA (obstructive sleep apnea) 10/22/2010    Past Surgical History:  Procedure Laterality Date   COLONOSCOPY N/A 06/27/2015   Procedure: COLONOSCOPY;  Surgeon: Claudis RAYMOND Rivet, MD;  Location: [facility];  Service: Endoscopy;  Laterality: N/A;   1030   CORONARY STENT INTERVENTION N/A 08/29/2022   Procedure: CORONARY STENT INTERVENTION;  Surgeon: Verlin Lonni BIRCH, MD;  [facility]: [facility];  Service: Cardiovascular;  Laterality: N/A;   ESOPHAGOGASTRODUODENOSCOPY N/A 01/15/2024   Procedure: EGD (ESOPHAGOGASTRODUODENOSCOPY);  Surgeon: Cinderella Deatrice FALCON, MD;  Location: [facility];  Service: Endoscopy;  Laterality: N/A;  2:15 pm, asa 1/2   HERNIA REPAIR     LEFT HEART CATH AND CORONARY ANGIOGRAPHY N/A 08/29/2022   Procedure: LEFT HEART CATH AND CORONARY ANGIOGRAPHY;  Surgeon: Verlin Lonni BIRCH, MD;  Location: [facility];  Service: Cardiovascular;  Laterality: N/A;   TRIGGER FINGER RELEASE Left 05/09/2015   Procedure: RELEASE TRIGGER FINGER/A-1 PULLEY, left index finger and left middle finger;  Surgeon: Arley Curia, MD;  Location: [facility];  Service: Orthopedics;  Laterality: Left;   UMBILICAL HERNIA REPAIR         Home Medications    Prior to Admission medications   Medication Sig Start Date End Date Taking? Authorizing Provider  azelastine (ASTELIN) 0.1 % nasal spray Place 1 spray into both nostrils 2 (two) times daily. Use in each nostril as directed 04/02/24  Yes Stuart Vernell Norris, PA-C  cetirizine (ZYRTEC ALLERGY) 10 MG tablet Take 1 tablet (10 mg total) by mouth daily. 04/02/24  Yes Stuart Vernell Norris, PA-C  allopurinol  (ZYLOPRIM ) 100 MG tablet Take 100 mg by mouth in the morning. 06/18/21   [provider]  aspirin  EC 81 MG  tablet Take 81 mg by mouth in the morning.    [provider]  calcium  carbonate (TUMS - DOSED IN MG ELEMENTAL CALCIUM ) 500 MG chewable tablet Chew 1 tablet by mouth daily as needed for indigestion or heartburn.    [provider]  carbamide peroxide (DEBROX) 6.5 % OTIC solution Place 5 drops into both ears 2 (two) times daily as needed. 06/23/23   Stuart Vernell Norris, PA-C  glucosamine-chondroitin 500-400 MG tablet Take 1 tablet  by mouth 3 (three) times daily.    [provider]  magnesium oxide (MAG-OX) 400 (240 Mg) MG tablet Take 400 mg by mouth at bedtime.    [provider]  metoprolol  succinate (TOPROL  XL) 25 MG 24 hr tablet Take 0.5 tablets (12.5 mg total) by mouth daily. 02/24/24   Tobb, Kardie, DO  nitroGLYCERIN  (NITROSTAT ) 0.4 MG SL tablet PLACE 1 TABLET UNDER TONGUE EVERY 5 MINS, UP TO 3 DOSES AS NEEDED FOR CHEST PAIN 09/11/23   Tobb, Kardie, DO  pantoprazole  (PROTONIX ) 40 MG tablet TAKE 1 TABLET BY MOUTH EVERY DAY 02/09/24   Ahmed, Deatrice FALCON, MD  rosuvastatin  (CRESTOR ) 20 MG tablet Take 20 mg by mouth at bedtime. 08/09/21   [provider]    Family History Family History  Problem Relation Age of Onset   Heart disease Father     Social History Social History   Tobacco Use   Smoking status: Never   Smokeless tobacco: Never  Vaping Use   Vaping status: Never Used  Substance Use Topics   Alcohol use: No    Alcohol/week: 0.0 standard drinks of alcohol   Drug use: No     Allergies   Vioxx [rofecoxib]   Review of Systems Review of Systems Per HPI  Physical Exam Triage Vital Signs ED Triage Vitals  Encounter Vitals Group     BP 04/02/24 0854 129/73     Girls Systolic BP Percentile --      Girls Diastolic BP Percentile --      Boys Systolic BP Percentile --      Boys Diastolic BP Percentile --      Pulse Rate 04/02/24 0854 87     Resp 04/02/24 0854 18     Temp 04/02/24 0854 98.6 F (37 C)     Temp Source 04/02/24 0854 Oral     SpO2 04/02/24 0854 91 %     Weight --      Height --      Head Circumference --      Peak Flow --      Pain Score 04/02/24 0856 2     Pain Loc --      Pain Education --      Exclude from Growth Chart --    No data found.  Updated Vital Signs BP 129/73 (BP Location: Right Arm)   Pulse 87   Temp 98.6 F (37 C) (Oral)   Resp 18   SpO2 91%   Visual Acuity Right Eye Distance:   Left Eye Distance:   Bilateral Distance:     Right Eye Near:   Left Eye Near:    Bilateral Near:     Physical Exam Vitals and nursing note reviewed.  Constitutional:      Appearance: He is well-developed.  HENT:     Head: Atraumatic.     Right Ear: External ear normal.     Left Ear: External ear normal.     Nose: Rhinorrhea present.  Mouth/Throat:     Pharynx: Posterior oropharyngeal erythema present. No oropharyngeal exudate.  Eyes:     Conjunctiva/sclera: Conjunctivae normal.     Pupils: Pupils are equal, round, and reactive to light.  Cardiovascular:     Rate and Rhythm: Normal rate and regular rhythm.  Pulmonary:     Effort: Pulmonary effort is normal. No respiratory distress.     Breath sounds: No wheezing or rales.  Musculoskeletal:        General: Normal range of motion.     Cervical back: Normal range of motion and neck supple.  Lymphadenopathy:     Cervical: No cervical adenopathy.  Skin:    General: Skin is warm and dry.  Neurological:     Mental Status: He is alert and oriented to person, place, and time.  Psychiatric:        Behavior: Behavior normal.      UC Treatments / Results  Labs (all labs ordered are listed, but only abnormal results are displayed) Labs Reviewed - No data to display  EKG   Radiology No results found.  Procedures Procedures (including critical care time)  Medications Ordered in UC Medications  dexamethasone (DECADRON) injection 10 mg (10 mg Intramuscular Given 04/02/24 0911)    Initial Impression / Assessment and Plan / UC Course  I have reviewed the triage vital signs and the nursing notes.  Pertinent labs & imaging results that were available during my care of the patient were reviewed by me and considered in my medical decision making (see chart for details).     Suspect viral sinusitis and seasonal allergy exacerbation.  Will treat with IM Decadron for exacerbation symptoms and start consistent allergy regimen of Zyrtec and Astelin.  Recommendations for  over-the-counter remedies for symptomatic relief reviewed, return precautions given.  Final Clinical Impressions(s) / UC Diagnoses   Final diagnoses:  Viral sinusitis  Seasonal allergic rhinitis due to other allergic trigger     Discharge Instructions      In addition to a daily consistent regimen of Zyrtec and Flonase  and/or Astelin to control any seasonal allergy component, use saline sinus rinses several times daily until feeling much better, continue Coricidin HBP, plain Mucinex , cough syrup as needed.  We have also given you a steroid shot today to help with the inflammation and exacerbated seasonal allergy symptoms.  I do believe you have caught a virus on top of uncontrolled seasonal allergies.  Follow-up for significantly worsening symptoms.    ED Prescriptions     Medication Sig Dispense Auth. Provider   azelastine (ASTELIN) 0.1 % nasal spray Place 1 spray into both nostrils 2 (two) times daily. Use in each nostril as directed 30 mL Stuart Vernell Norris, PA-C   cetirizine (ZYRTEC ALLERGY) 10 MG tablet Take 1 tablet (10 mg total) by mouth daily. 30 tablet Stuart Vernell Norris, [place]      PDMP not reviewed this encounter.   Stuart Vernell Norris, [place] 04/04/24 [phone]

## 2024-04-21 ENCOUNTER — Ambulatory Visit (INDEPENDENT_AMBULATORY_CARE_PROVIDER_SITE_OTHER): Admitting: Gastroenterology

## 2024-04-21 ENCOUNTER — Encounter (INDEPENDENT_AMBULATORY_CARE_PROVIDER_SITE_OTHER): Payer: Self-pay | Admitting: Gastroenterology

## 2024-04-21 VITALS — BP 132/68 | HR 79 | Temp 97.5°F | Ht 69.0 in | Wt 256.0 lb

## 2024-04-21 DIAGNOSIS — K219 Gastro-esophageal reflux disease without esophagitis: Secondary | ICD-10-CM

## 2024-04-21 DIAGNOSIS — Z6837 Body mass index (BMI) 37.0-37.9, adult: Secondary | ICD-10-CM

## 2024-04-21 DIAGNOSIS — R1319 Other dysphagia: Secondary | ICD-10-CM

## 2024-04-21 DIAGNOSIS — Z8719 Personal history of other diseases of the digestive system: Secondary | ICD-10-CM

## 2024-04-21 NOTE — Progress Notes (Signed)
 Richard Nolan , M.D. Gastroenterology & Hepatology [facility] [address] [place], [place] [address] Primary Care Physician: Sheryle Carwin, MD [address] [place] [address]  Chief Complaint:  Dysphagia   History of Present Illness: Richard Nolan. is a 68 y.o. male with history of Cad s/p PCI on aspirin  who presents for evaluation of solid food dysphagia.  Patient was last seen in 12/2023 for solid food dysphagia where he underwent upper endoscopy with dilation Schatzki's ring LA class A esophagitis was started on PPI Patient reports his dysphagia has resolved and he has recently stopped taking PPI.The patient denies having any nausea, vomiting, fever, chills, hematochezia, melena, hematemesis, abdominal distention, abdominal pain, diarrhea, jaundice, pruritus or weight loss.  Labs from December 2024 hemoglobin 16 creatinine 1.48 normal liver enzymes  Last EGD:12/2023  - Nodular mucosa in the esophagus. Biopsied. - Mild Schatzki ring. Dilated. - LA Grade A reflux esophagitis with no bleeding. - 2 cm hiatal hernia. - Erythematous mucosa in the antrum. Biopsied. - Normal duodenal bulb and second portion of the duodenum. - Biopsies were taken with a cold forceps for evaluation of eosinophilic esophagitis.  A. GASTRIC, BIOPSY:       Gastric antral / oxyntic mucosa without significant diagnostic  alteration.      No H. pylori identified on HE stain.       Negative for intestinal metaplasia or dysplasia.   B. ESOPPHAGEAL, BIOPSY:       Squamocolumnar mucosa without significant diagnostic alteration.       Negative for intestinal metaplasia or dysplasia.   Last Colonoscopy:06/2015  Prep satisfactory. Normal mucosa of cecum, ascending colon, hepatic flexure, transverse colon, splenic flexure, descending and sigmoid colon. Normal rectal mucosa. Fall hemorrhoids below the dentate line.    Repeat 10 years  FHx: neg for  any gastrointestinal/liver disease, no malignancies Social: neg smoking, alcohol or illicit drug use Surgical: Hernia repair  Past Medical History: Past Medical History:  Diagnosis Date   Aortic dilatation 08/2022   38mm   Arthritis    CAD (coronary artery disease) 08/29/2022   DES 1st dx   Gout    Hyperlipidemia    Hypertension    OSA (obstructive sleep apnea)    CPAP nightly   Stenosing tenosynovitis of finger of left hand    left index and left middle fingers    Past Surgical History: Past Surgical History:  Procedure Laterality Date   COLONOSCOPY N/A 06/27/2015   Procedure: COLONOSCOPY;  Surgeon: Claudis RAYMOND Rivet, MD;  Location: [facility];  Service: Endoscopy;  Laterality: N/A;  1030   CORONARY STENT INTERVENTION N/A 08/29/2022   Procedure: CORONARY STENT INTERVENTION;  Surgeon: Verlin Lonni BIRCH, MD;  Location: [facility];  Service: Cardiovascular;  Laterality: N/A;   ESOPHAGOGASTRODUODENOSCOPY N/A 01/15/2024   Procedure: EGD (ESOPHAGOGASTRODUODENOSCOPY);  Surgeon: Cinderella Deatrice FALCON, MD;  Location: [facility];  Service: Endoscopy;  Laterality: N/A;  2:15 pm, asa 1/2   HERNIA REPAIR     LEFT HEART CATH AND CORONARY ANGIOGRAPHY N/A 08/29/2022   Procedure: LEFT HEART CATH AND CORONARY ANGIOGRAPHY;  Surgeon: Verlin Lonni BIRCH, MD;  Location: [facility];  Service: Cardiovascular;  Laterality: N/A;   TRIGGER FINGER RELEASE Left 05/09/2015   Procedure: RELEASE TRIGGER FINGER/A-1 PULLEY, left index finger and left middle finger;  Surgeon: Arley Curia, MD;  Location: [facility];  Service: Orthopedics;  Laterality: Left;   UMBILICAL HERNIA  REPAIR      Family History: Family History  Problem Relation Age of Onset   Heart disease Father     Social History: Social History   Tobacco Use  Smoking Status Never  Smokeless Tobacco Never   Social History   Substance and Sexual Activity  Alcohol Use No   Alcohol/week: 0.0 standard  drinks of alcohol   Social History   Substance and Sexual Activity  Drug Use No    Allergies: Allergies  Allergen Reactions   Vioxx [Rofecoxib] Anaphylaxis    Throat swelling    Medications: Current Outpatient Medications  Medication Sig Dispense Refill   allopurinol  (ZYLOPRIM ) 100 MG tablet Take 100 mg by mouth in the morning.     aspirin  EC 81 MG tablet Take 81 mg by mouth in the morning.     azelastine (ASTELIN) 0.1 % nasal spray Place 1 spray into both nostrils 2 (two) times daily. Use in each nostril as directed 30 mL 2   calcium  carbonate (TUMS - DOSED IN MG ELEMENTAL CALCIUM ) 500 MG chewable tablet Chew 1 tablet by mouth daily as needed for indigestion or heartburn.     carbamide peroxide (DEBROX) 6.5 % OTIC solution Place 5 drops into both ears 2 (two) times daily as needed. 15 mL 0   glucosamine-chondroitin 500-400 MG tablet Take 1 tablet by mouth 3 (three) times daily.     magnesium oxide (MAG-OX) 400 (240 Mg) MG tablet Take 400 mg by mouth at bedtime.     metoprolol  succinate (TOPROL  XL) 25 MG 24 hr tablet Take 0.5 tablets (12.5 mg total) by mouth daily. 45 tablet 0   nitroGLYCERIN  (NITROSTAT ) 0.4 MG SL tablet PLACE 1 TABLET UNDER TONGUE EVERY 5 MINS, UP TO 3 DOSES AS NEEDED FOR CHEST PAIN 25 tablet 9   rosuvastatin  (CRESTOR ) 20 MG tablet Take 20 mg by mouth at bedtime.     No current facility-administered medications for this visit.    Review of Systems: GENERAL: negative for malaise, night sweats HEENT: No changes in hearing or vision, no nose bleeds or other nasal problems. NECK: Negative for lumps, goiter, pain and significant neck swelling RESPIRATORY: Negative for cough, wheezing CARDIOVASCULAR: Negative for chest pain, leg swelling, palpitations, orthopnea GI: SEE HPI MUSCULOSKELETAL: Negative for joint pain or swelling, back pain, and muscle pain. SKIN: Negative for lesions, rash HEMATOLOGY Negative for prolonged bleeding, bruising easily, and swollen  nodes. ENDOCRINE: Negative for cold or heat intolerance, polyuria, polydipsia and goiter. NEURO: negative for tremor, gait imbalance, syncope and seizures. The remainder of the review of systems is noncontributory.   Physical Exam: BP 132/68 (BP Location: Left Arm, Patient Position: Sitting, Cuff Size: Large)   Pulse 79   Temp (!) 97.5 F (36.4 C) (Temporal)   Ht 5' 9 (1.753 m)   Wt 256 lb (116.1 kg)   BMI 37.80 kg/m  GENERAL: The patient is AO x3, in no acute distress. HEENT: Head is normocephalic and atraumatic. EOMI are intact. Mouth is well hydrated and without lesions. NECK: Supple. No masses LUNGS: Clear to auscultation. No presence of rhonchi/wheezing/rales. Adequate chest expansion HEART: RRR, normal s1 and s2. ABDOMEN: Soft, nontender, no guarding, no peritoneal signs, and nondistended. BS +. No masses.  Imaging/Labs: as above     Latest Ref Rng & Units 08/27/2022    8:18 AM  CBC  WBC 3.4 - 10.8 x10E3/uL 9.0   Hemoglobin 13.0 - 17.7 g/dL 83.1   Hematocrit 62.4 - 51.0 % 50.1  Platelets 150 - 450 x10E3/uL 225    No results found for: IRON, TIBC, FERRITIN  I personally reviewed and interpreted the available labs, imaging and endoscopic files.  Impression and Plan:  Richard Ridley. is a 68 y.o. male with history of Cad s/p PCI on aspirin  who presents for evaluation of solid food dysphagia.  #Solid food dysphagia -resolved   solid food dysphagia has completely resolved where he underwent upper endoscopy with dilation Schatzki's ring LA class A esophagitis was started on PPI  Continue with PPI, lowest effective dose, or can be taking as needed as well  Patient was re-assured regarding PPI use and safety of when appropriate indications in question. Most recent studies on PPI therapy that association of symptoms is not equivalent to causation and overall association with for example osteoporosis is weak and based on observational studies. When PPI use is  indicated, it is safe to proceed with therapy and titrate dosing/use based on symptom response.   #HCM  Last colonoscopy December 2016 and hence patient is due December 2026 for screening colonoscopy  #BMI 37      - walking at a brisk pace/biking at moderate intesity 2.5-5 hours per week     - use pedometer/step counter to track activity     - goal to lose 5-10% of initial body weight     - avoid suagry drinks and juices, use zero calorie beverages     - increase water  intake     - eat a low carb diet with plenty of veggies and fruit     - Get sufficient sleep 7-8 hrs nightly     - maitain active lifestyle     - avoid alcohol     - recommend 2-3 cups Coffee daily     - Counsel on lowering cholesterol by having a diet rich in vegetables,          protein (avoid red meats) and good fats(fish, salmon).    All questions were answered.      Richard Yoakum Faizan Sanjeev Main, MD Gastroenterology and Hepatology [facility] Gastroenterology   This chart has been completed using [facility] Dictation software, and while attempts have been made to ensure accuracy , certain words and phrases may not be transcribed as intended

## 2024-05-21 ENCOUNTER — Other Ambulatory Visit: Payer: Self-pay | Admitting: Cardiology

## 2024-05-24 ENCOUNTER — Telehealth: Payer: Self-pay | Admitting: Cardiology

## 2024-05-24 MED ORDER — METOPROLOL SUCCINATE ER 25 MG PO TB24: 12.5000 mg | ORAL_TABLET | Freq: Every day | ORAL | 3 refills | Status: AC

## 2024-05-24 NOTE — Telephone Encounter (Signed)
*  STAT* If patient is at the pharmacy, call can be transferred to refill team.   1. Which medications need to be refilled? (please list name of each medication and dose if known)   metoprolol  succinate (TOPROL  XL) 25 MG 24 hr tablet  Take 0.5 tablets (12.5 mg total) by mouth daily.    2. Would you like to learn more about the convenience, safety, & potential cost savings by using the [facility]? No    3. Are you open to using the [facility] No   4. Which pharmacy/location (including street and city if local pharmacy) is medication to be sent to? [id number] - [place], [place] - [address] AT [address]     5. Do they need a 30 day or 90 day supply? 90 Day Supply

## 2024-05-24 NOTE — Telephone Encounter (Signed)
 Refill sent.

## 2024-08-04 ENCOUNTER — Other Ambulatory Visit (INDEPENDENT_AMBULATORY_CARE_PROVIDER_SITE_OTHER): Payer: Self-pay | Admitting: Gastroenterology
